# Patient Record
Sex: Female | Born: 1985 | Race: White | Hispanic: No | Marital: Married | Smoking: Never smoker
Health system: Southern US, Community
[De-identification: ages and names within clinical notes are randomized; demographics above are authoritative.]

## PROBLEM LIST (undated history)

## (undated) ENCOUNTER — Inpatient Hospital Stay (HOSPITAL_COMMUNITY): Payer: Self-pay

## (undated) DIAGNOSIS — L2989 Other pruritus: Secondary | ICD-10-CM

## (undated) DIAGNOSIS — Z6836 Body mass index (BMI) 36.0-36.9, adult: Secondary | ICD-10-CM

## (undated) DIAGNOSIS — E6609 Other obesity due to excess calories: Secondary | ICD-10-CM

## (undated) DIAGNOSIS — R233 Spontaneous ecchymoses: Secondary | ICD-10-CM

## (undated) DIAGNOSIS — G43011 Migraine without aura, intractable, with status migrainosus: Secondary | ICD-10-CM

## (undated) DIAGNOSIS — Z0000 Encounter for general adult medical examination without abnormal findings: Secondary | ICD-10-CM

## (undated) DIAGNOSIS — L298 Other pruritus: Secondary | ICD-10-CM

## (undated) DIAGNOSIS — D509 Iron deficiency anemia, unspecified: Secondary | ICD-10-CM

## (undated) DIAGNOSIS — J019 Acute sinusitis, unspecified: Secondary | ICD-10-CM

## (undated) DIAGNOSIS — E66812 Obesity, class 2: Secondary | ICD-10-CM

## (undated) DIAGNOSIS — B9689 Other specified bacterial agents as the cause of diseases classified elsewhere: Secondary | ICD-10-CM

## (undated) DIAGNOSIS — G43909 Migraine, unspecified, not intractable, without status migrainosus: Secondary | ICD-10-CM

## (undated) DIAGNOSIS — R112 Nausea with vomiting, unspecified: Secondary | ICD-10-CM

## (undated) DIAGNOSIS — K8020 Calculus of gallbladder without cholecystitis without obstruction: Secondary | ICD-10-CM

## (undated) DIAGNOSIS — Z9889 Other specified postprocedural states: Secondary | ICD-10-CM

## (undated) DIAGNOSIS — R030 Elevated blood-pressure reading, without diagnosis of hypertension: Secondary | ICD-10-CM

## (undated) DIAGNOSIS — K5790 Diverticulosis of intestine, part unspecified, without perforation or abscess without bleeding: Secondary | ICD-10-CM

## (undated) DIAGNOSIS — IMO0001 Reserved for inherently not codable concepts without codable children: Secondary | ICD-10-CM

## (undated) DIAGNOSIS — E669 Obesity, unspecified: Secondary | ICD-10-CM

## (undated) DIAGNOSIS — Z9289 Personal history of other medical treatment: Secondary | ICD-10-CM

## (undated) DIAGNOSIS — D649 Anemia, unspecified: Secondary | ICD-10-CM

## (undated) DIAGNOSIS — K589 Irritable bowel syndrome without diarrhea: Secondary | ICD-10-CM

## (undated) DIAGNOSIS — F419 Anxiety disorder, unspecified: Secondary | ICD-10-CM

## (undated) DIAGNOSIS — K625 Hemorrhage of anus and rectum: Secondary | ICD-10-CM

## (undated) DIAGNOSIS — E785 Hyperlipidemia, unspecified: Secondary | ICD-10-CM

## (undated) HISTORY — DX: Elevated blood-pressure reading, without diagnosis of hypertension: R03.0

## (undated) HISTORY — DX: Hyperlipidemia, unspecified: E78.5

## (undated) HISTORY — PX: DENTAL SURGERY: SHX609

## (undated) HISTORY — DX: Irritable bowel syndrome, unspecified: K58.9

## (undated) HISTORY — DX: Reserved for inherently not codable concepts without codable children: IMO0001

## (undated) HISTORY — DX: Hemorrhage of anus and rectum: K62.5

## (undated) HISTORY — PX: CYST EXCISION: SHX5701

## (undated) HISTORY — DX: Diverticulosis of intestine, part unspecified, without perforation or abscess without bleeding: K57.90

## (undated) HISTORY — DX: Nausea with vomiting, unspecified: R11.2

## (undated) HISTORY — DX: Other specified postprocedural states: Z98.890

## (undated) HISTORY — DX: Anemia, unspecified: D64.9

---

## 2011-11-23 ENCOUNTER — Ambulatory Visit (INDEPENDENT_AMBULATORY_CARE_PROVIDER_SITE_OTHER): Payer: 59

## 2011-11-23 ENCOUNTER — Ambulatory Visit (INDEPENDENT_AMBULATORY_CARE_PROVIDER_SITE_OTHER): Payer: Self-pay | Admitting: Sports Medicine

## 2011-11-23 ENCOUNTER — Encounter: Payer: Self-pay | Admitting: Sports Medicine

## 2011-11-23 VITALS — BP 111/71 | HR 73 | Wt 207.0 lb

## 2011-11-23 DIAGNOSIS — M5412 Radiculopathy, cervical region: Secondary | ICD-10-CM

## 2011-11-23 DIAGNOSIS — E785 Hyperlipidemia, unspecified: Secondary | ICD-10-CM

## 2011-11-23 DIAGNOSIS — M545 Low back pain, unspecified: Secondary | ICD-10-CM

## 2011-11-23 DIAGNOSIS — M542 Cervicalgia: Secondary | ICD-10-CM

## 2011-11-23 DIAGNOSIS — Z299 Encounter for prophylactic measures, unspecified: Secondary | ICD-10-CM | POA: Insufficient documentation

## 2011-11-23 MED ORDER — RED YEAST RICE 600 MG PO TABS: ORAL_TABLET | ORAL | Status: DC

## 2011-11-23 MED ORDER — PREDNISONE 50 MG PO TABS: ORAL_TABLET | ORAL | Status: DC

## 2011-11-23 MED ORDER — CYCLOBENZAPRINE HCL 10 MG PO TABS: ORAL_TABLET | ORAL | Status: DC

## 2011-11-23 MED ORDER — MELOXICAM 15 MG PO TABS: ORAL_TABLET | ORAL | Status: DC

## 2011-11-23 NOTE — Assessment & Plan Note (Signed)
She would like to try herbals, and oils first. I think this is appropriate. I will give her 6 weeks, and then we will recheck. She may also try  red rice yeast extract.

## 2011-11-23 NOTE — Progress Notes (Signed)
Subjective:     Patient ID: Michelle Moody, female   DOB: Mar 21, 1985, 26 y.o.   MRN: [id number]  HPI Patient is a 26 yo caucasian female with a PMH of HLD who is presenting today for establishment of care. Patient has not seen a regular PCP since moving from [place] 8 years prior but has received health care from her OBGYN.   Patient has two main complaints,  Neck pain radiating into right middle finger. Patient says the pain started in 2011 after adjusting to catch a baby child that she was carrying. Patient complains of pain with vertical movement of her head but not horizontal. Says the pain ranges from a 3-6/10 depending on the day and it only hurts around 1 day per week. The pain in the neck radiates into a numbness shooting down the dorsal aspect of her right arm ending in the middle finger.   The pain in her neck is associated with migraine headaches. She says the headaches often occur when her pain is present. She also has a longer history of migraine headaches that routinely occur 10 days before her menstrual cycle.   Lower Back Pain: Patient describes the pain as having started during childbirth 13 months prior. She believes the pain is from a bad epidural injection because it took around an hour to complete the injection. She describes the pain as "A dull ache on the center of my spine". This pain is not made worse by coughing, sneezing, walking or running. The says the pain is worst when she is laying down on her stomach or back. She now has to sleep on her side with a pillow between her legs.    Review of Systems  Constitutional: Negative.   HENT: Negative.   Eyes: Negative.   Respiratory: Negative.   Cardiovascular: Negative.   Gastrointestinal: Negative.   Genitourinary: Negative.   Musculoskeletal: Positive for back pain.  Skin: Negative.   Neurological: Negative.   Hematological: Negative.   Psychiatric/Behavioral: Negative.        Objective:   Physical Exam    Constitutional: She is oriented to person, place, and time. She appears well-developed and well-nourished.  HENT:  Head: Normocephalic.  Eyes: Pupils are equal, round, and reactive to light. Right eye exhibits no discharge. Left eye exhibits no discharge. No scleral icterus.  Neck: Normal range of motion. Neck supple. No JVD present. No tracheal deviation present. No thyromegaly present.  Cardiovascular: Normal rate, regular rhythm, normal heart sounds and intact distal pulses.   Pulmonary/Chest: Effort normal and breath sounds normal. No stridor. No respiratory distress. She has no wheezes. She has no rales. She exhibits no tenderness.  Abdominal: Soft.  Musculoskeletal: Normal range of motion. She exhibits tenderness. She exhibits no edema.       Cervical back: She exhibits tenderness.       Lumbar back: She exhibits tenderness.  Lymphadenopathy:    She has no cervical adenopathy.  Neurological: She is alert and oriented to person, place, and time. She has normal reflexes. No cranial nerve deficit. Coordination normal.  Skin: Skin is warm and dry.  Psychiatric: She has a normal mood and affect. Her behavior is normal. Judgment and thought content normal.       Assessment/Plan:

## 2011-11-23 NOTE — Assessment & Plan Note (Signed)
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 X-rays, prednisone, Flexeril, Mobic, formal physical therapy. I will see her back in 4 weeks.

## 2011-11-23 NOTE — Assessment & Plan Note (Signed)
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 Mobic, Flexeril as above. I would like her to work on this as well with formal physical therapy. X-rays of her lumbar spine.

## 2011-11-23 NOTE — Patient Instructions (Addendum)
Try red rice yeast extract, 3 times a day. Come back to see me in 4-6 weeks, we will recheck your cholesterol, and if no better should try a medical intervention.

## 2011-12-08 ENCOUNTER — Telehealth: Payer: Self-pay | Admitting: *Deleted

## 2011-12-08 ENCOUNTER — Other Ambulatory Visit: Payer: Self-pay | Admitting: Sports Medicine

## 2011-12-08 MED ORDER — ELETRIPTAN HYDROBROMIDE 20 MG PO TABS: ORAL_TABLET | ORAL | Status: DC

## 2011-12-08 NOTE — Telephone Encounter (Signed)
Pt.notified

## 2011-12-08 NOTE — Telephone Encounter (Signed)
Pt calls and states she has history of migraines that correlates with her cycle and has been on relpax in the past. Would like rx for this sent to her pharmacy if at all possible

## 2011-12-08 NOTE — Telephone Encounter (Signed)
Pt states she hasn't received a call back for her xr results. Please advise.

## 2011-12-08 NOTE — Telephone Encounter (Signed)
Yeah, I was going to just see her back in 4 weeks. There's nothing on the x-rays other than signs of muscle spasm.

## 2011-12-27 ENCOUNTER — Ambulatory Visit: Payer: Self-pay | Admitting: Sports Medicine

## 2012-02-23 ENCOUNTER — Emergency Department (HOSPITAL_BASED_OUTPATIENT_CLINIC_OR_DEPARTMENT_OTHER): Payer: 59

## 2012-02-23 ENCOUNTER — Encounter (HOSPITAL_BASED_OUTPATIENT_CLINIC_OR_DEPARTMENT_OTHER): Payer: Self-pay | Admitting: *Deleted

## 2012-02-23 ENCOUNTER — Emergency Department (HOSPITAL_BASED_OUTPATIENT_CLINIC_OR_DEPARTMENT_OTHER)
Admission: EM | Admit: 2012-02-23 | Discharge: 2012-02-23 | Disposition: A | Discharge status: Home / Self Care | Payer: 59 | Attending: Emergency Medicine | Admitting: Emergency Medicine

## 2012-02-23 DIAGNOSIS — E785 Hyperlipidemia, unspecified: Secondary | ICD-10-CM | POA: Insufficient documentation

## 2012-02-23 DIAGNOSIS — IMO0002 Reserved for concepts with insufficient information to code with codable children: Secondary | ICD-10-CM | POA: Insufficient documentation

## 2012-02-23 DIAGNOSIS — K529 Noninfective gastroenteritis and colitis, unspecified: Secondary | ICD-10-CM

## 2012-02-23 DIAGNOSIS — Z791 Long term (current) use of non-steroidal anti-inflammatories (NSAID): Secondary | ICD-10-CM | POA: Insufficient documentation

## 2012-02-23 DIAGNOSIS — K5289 Other specified noninfective gastroenteritis and colitis: Secondary | ICD-10-CM | POA: Insufficient documentation

## 2012-02-23 DIAGNOSIS — G43909 Migraine, unspecified, not intractable, without status migrainosus: Secondary | ICD-10-CM | POA: Insufficient documentation

## 2012-02-23 DIAGNOSIS — Z79899 Other long term (current) drug therapy: Secondary | ICD-10-CM | POA: Insufficient documentation

## 2012-02-23 DIAGNOSIS — R110 Nausea: Secondary | ICD-10-CM | POA: Insufficient documentation

## 2012-02-23 HISTORY — DX: Migraine, unspecified, not intractable, without status migrainosus: G43.909

## 2012-02-23 LAB — URINALYSIS, ROUTINE W REFLEX MICROSCOPIC
Bilirubin Urine: NEGATIVE
Hgb urine dipstick: NEGATIVE
Protein, ur: NEGATIVE mg/dL
Urobilinogen, UA: 1 mg/dL (ref 0.0–1.0)

## 2012-02-23 LAB — CBC WITH DIFFERENTIAL/PLATELET
Eosinophils Absolute: 0.1 10*3/uL (ref 0.0–0.7)
Eosinophils Relative: 1 % (ref 0–5)
Lymphs Abs: 0.9 10*3/uL (ref 0.7–4.0)
MCH: 26.7 pg (ref 26.0–34.0)
MCV: 79.3 fL (ref 78.0–100.0)
Platelets: 263 10*3/uL (ref 150–400)
RBC: 4.3 MIL/uL (ref 3.87–5.11)
RDW: 14.8 % (ref 11.5–15.5)

## 2012-02-23 LAB — COMPREHENSIVE METABOLIC PANEL
ALT: 68 U/L — ABNORMAL HIGH (ref 0–35)
Calcium: 9.1 mg/dL (ref 8.4–10.5)
Creatinine, Ser: 0.6 mg/dL (ref 0.50–1.10)
GFR calc Af Amer: >90 mL/min (ref >90)
Glucose, Bld: 124 mg/dL — ABNORMAL HIGH (ref 70–99)
Sodium: 139 mEq/L (ref 135–145)
Total Protein: 6.7 g/dL (ref 6.0–8.3)

## 2012-02-23 LAB — URINE MICROSCOPIC-ADD ON

## 2012-02-23 LAB — LIPASE, BLOOD: Lipase: 27 U/L (ref 11–59)

## 2012-02-23 MED ORDER — ONDANSETRON HCL 8 MG PO TABS: 8.0000 mg | ORAL_TABLET | ORAL | Status: DC | PRN

## 2012-02-23 MED ORDER — IOHEXOL 300 MG/ML  SOLN
100.0000 mL | Freq: Once | INTRAMUSCULAR | Status: AC | PRN
  Administered 2012-02-23: 100 mL via INTRAVENOUS

## 2012-02-23 MED ORDER — KETOROLAC TROMETHAMINE 30 MG/ML IJ SOLN
30.0000 mg | Freq: Once | INTRAMUSCULAR | Status: AC
  Administered 2012-02-23: 30 mg via INTRAVENOUS
  Filled 2012-02-23: qty 1

## 2012-02-23 MED ORDER — SODIUM CHLORIDE 0.9 % IV BOLUS (SEPSIS)
1000.0000 mL | Freq: Once | INTRAVENOUS | Status: AC
  Administered 2012-02-23: 1000 mL via INTRAVENOUS

## 2012-02-23 MED ORDER — IOHEXOL 300 MG/ML  SOLN
50.0000 mL | Freq: Once | INTRAMUSCULAR | Status: AC | PRN
  Administered 2012-02-23: 50 mL via ORAL

## 2012-02-23 MED ORDER — ONDANSETRON HCL 4 MG/2ML IJ SOLN
4.0000 mg | Freq: Once | INTRAMUSCULAR | Status: AC
  Administered 2012-02-23: 4 mg via INTRAVENOUS
  Filled 2012-02-23: qty 2

## 2012-02-23 NOTE — ED Notes (Signed)
Pt c/o sharp epigastric pain with nausea and diarrhea but no vomiting since 10:00pm.

## 2012-02-23 NOTE — ED Provider Notes (Signed)
History     CSN: [id number]  Arrival date & time 02/23/12  0239   First MD Initiated Contact with Patient 02/23/12 0254      Chief Complaint  Patient presents with  . Abdominal Pain    (Consider location/radiation/quality/duration/timing/severity/associated sxs/prior treatment) HPI Comments: Patient presents diarrhea off and on all week.  Started last night with severe pain in the epigastric region.  She has been nauseated but no vomiting.  Reports temp of 99 at home.  No urinary complaints.    Patient is a 27 y.o. female presenting with abdominal pain. The history is provided by the patient.  Abdominal Pain The primary symptoms of the illness include abdominal pain, nausea and diarrhea. The primary symptoms of the illness do not include fever. Episode onset: 10 PM. The onset of the illness was sudden. The problem has been rapidly worsening.  The patient states that she believes she is currently not pregnant. The patient has had a change in bowel habit.    Past Medical History  Diagnosis Date  . Hyperlipidemia   . Migraines     History reviewed. No pertinent past surgical history.  Family History  Problem Relation Age of Onset  . Cancer Father   . Hyperlipidemia Father   . Hypertension Father   . Cancer Maternal Grandmother   . Alcohol abuse Maternal Grandfather   . Stroke Paternal Grandmother   . Heart disease Paternal Grandfather   . Hyperlipidemia Paternal Grandfather   . Hypertension Paternal Grandfather     History  Substance Use Topics  . Smoking status: Never Smoker   . Smokeless tobacco: Not on file  . Alcohol Use: No    OB History    Grav Para Term Preterm Abortions TAB SAB Ect Mult Living                  Review of Systems  Constitutional: Negative for fever.  Gastrointestinal: Positive for nausea, abdominal pain and diarrhea.  All other systems reviewed and are negative.    Allergies  Clindamycin/lincomycin  Home Medications   Current  Outpatient Rx  Name  Route  Sig  Dispense  Refill  . CYCLOBENZAPRINE HCL 10 MG PO TABS      One half tab PO qHS, then increase gradually to one tab TID.   30 tablet   0   . ELETRIPTAN HYDROBROMIDE 20 MG PO TABS      One tablet by mouth at onset of headache. May repeat in 2 hours if headache persists or recurs. may repeat in 2 hours if necessary   10 tablet   3   . MELOXICAM 15 MG PO TABS      One tab PO qAM with breakfast for 2 weeks, then daily prn pain.   30 tablet   3   . PREDNISONE 50 MG PO TABS      One tab PO daily for 5 days.   5 tablet   0   . RED YEAST RICE 600 MG PO TABS      One tab by mouth 3 times a day.           BP 119/61  Pulse 98  Temp 98.9 F (37.2 C) (Oral)  Resp 20  Ht 5\' 4"  (1.626 m)  Wt 203 lb (92.08 kg)  BMI 34.84 kg/m2  SpO2 100%  LMP 02/01/2012  Physical Exam  Nursing note and vitals reviewed. Constitutional: She is oriented to person, place, and time. She appears well-developed  and well-nourished. No distress.  HENT:  Head: Normocephalic and atraumatic.  Neck: Normal range of motion. Neck supple.  Cardiovascular: Normal rate and regular rhythm.  Exam reveals no gallop and no friction rub.   No murmur heard. Pulmonary/Chest: Effort normal and breath sounds normal. No respiratory distress. She has no wheezes.  Abdominal: Soft. Bowel sounds are normal. She exhibits no distension. There is no rebound and no guarding.       There is ttp in the epigastric region with no rebound or guarding.  Bowel sounds are present.    Musculoskeletal: Normal range of motion.  Neurological: She is alert and oriented to person, place, and time.  Skin: Skin is warm and dry. She is not diaphoretic.    ED Course  Procedures (including critical care time)   Labs Reviewed  PREGNANCY, URINE  URINALYSIS, ROUTINE W REFLEX MICROSCOPIC   No results found.   No diagnosis found.    MDM  The patient presents with nausea and diarrhea that sounds viral  in nature.  She reports significant epigastric discomfort and tells me she has a history of having had gallstones in the past.  She has a mild elevation of the ast and alt of which I am uncertain the significance.  For this reason, a ct was obtained that showed cholelithiasis but did not show any cholecystitis or ductal dilatation.  She will be discharged to home with zofran, to return prn.            Geoffery Lyons, MD 02/23/12 [phone]

## 2012-02-23 NOTE — ED Notes (Signed)
MD at bedside. 

## 2012-02-23 NOTE — ED Notes (Signed)
Patient transported to CT 

## 2012-02-23 NOTE — ED Notes (Signed)
Pt returned from CT °

## 2012-02-24 LAB — URINE CULTURE: Colony Count: 25000

## 2012-06-08 ENCOUNTER — Other Ambulatory Visit: Payer: Self-pay | Admitting: *Deleted

## 2012-06-08 ENCOUNTER — Encounter: Payer: Self-pay | Admitting: Sports Medicine

## 2012-06-08 ENCOUNTER — Ambulatory Visit (INDEPENDENT_AMBULATORY_CARE_PROVIDER_SITE_OTHER): Payer: 59 | Admitting: Sports Medicine

## 2012-06-08 VITALS — BP 128/75 | HR 83 | Wt 211.0 lb

## 2012-06-08 DIAGNOSIS — L723 Sebaceous cyst: Secondary | ICD-10-CM

## 2012-06-08 MED ORDER — CEPHALEXIN 500 MG PO CAPS: 500.0000 mg | ORAL_CAPSULE | Freq: Three times a day (TID) | ORAL | Status: DC

## 2012-06-08 NOTE — Progress Notes (Signed)
  Subjective:    CC: Cyst  HPI: Shyia has had a lump on the right side of her head for approximately 3 years. It has been slowly growing. She does have a history of multiple sebaceous cysts, and has had several removed already. Unfortunately, she is unable to have this done in the operating room, and requests I remove it today. It is minimally painful, pain is localized, doesn't radiate, mild.  Past medical history, Surgical history, Family history not pertinant except as noted below, Social history, Allergies, and medications have been entered into the medical record, reviewed, and no changes needed.   Review of Systems: No fevers, chills, night sweats, weight loss, chest pain, or shortness of breath.   Objective:    General: Well Developed, well nourished, and in no acute distress.  Neuro: Alert and oriented x3, extra-ocular muscles intact, sensation grossly intact.  HEENT: Normocephalic, atraumatic, pupils equal round reactive to light, neck supple, no masses, no lymphadenopathy, thyroid nonpalpable.  Skin: Warm and dry, no rashes.  There is a 3 cm sebaceous cyst of the right side of the head. Cardiac: Regular rate and rhythm, no murmurs rubs or gallops, no lower extremity edema.  Respiratory: Clear to auscultation bilaterally. Not using accessory muscles, speaking in full sentences.  Procedure:  Excision of  right-sided head sebaceous cyst. Risks, benefits, and alternatives explained and consent obtained. Time out conducted. Surface prepped with alcohol and Betadine. 5cc lidocaine with epinephine infiltrated in a field block. Adequate anesthesia ensured. Area prepped and draped in a sterile fashion. Incision made with 18-gauge needle. Using sharp and blunt dissection, the sebaceous cyst was empty, the cyst capsule was then removed en bloc. A single 3-0 Ethilon vertical mattress suture was placed. Hemostasis achieved. Pt stable.  Impression and Recommendations:

## 2012-06-08 NOTE — Assessment & Plan Note (Addendum)
Excision of right-sided scalp sebaceous cyst. Keflex for 7 days given for infection prophylaxis. A single vertical mattress suture was placed to close off the potential space. She'll return in one week for suture removal and consideration of excision of the next cyst. I have asked her to shave a small part of her head to assist me in maintaining a clear operating field.

## 2012-06-14 ENCOUNTER — Ambulatory Visit: Payer: 59 | Admitting: Sports Medicine

## 2012-06-15 ENCOUNTER — Ambulatory Visit: Payer: 59 | Admitting: Sports Medicine

## 2012-06-18 ENCOUNTER — Encounter: Payer: Self-pay | Admitting: Sports Medicine

## 2012-06-18 ENCOUNTER — Ambulatory Visit (INDEPENDENT_AMBULATORY_CARE_PROVIDER_SITE_OTHER): Payer: 59 | Admitting: Sports Medicine

## 2012-06-18 VITALS — BP 112/72 | HR 74

## 2012-06-18 DIAGNOSIS — L723 Sebaceous cyst: Secondary | ICD-10-CM

## 2012-06-18 NOTE — Progress Notes (Addendum)
   Procedure:  Excision of  left-sided head approximately 2.5cm sebaceous cyst. Risks, benefits, and alternatives explained and consent obtained. Time out conducted. Surface prepped with alcohol and Betadine. 5cc lidocaine with epinephine infiltrated in a field block. Adequate anesthesia ensured. Area prepped and draped in a sterile fashion. Incision made with 18-gauge needle. Using sharp and blunt dissection, the 2.5 cm sebaceous cyst was emptied, the cyst capsule was then removed en bloc. A single 4-0 Ethilon horizontal mattress suture was placed. Hemostasis achieved. Pt stable.

## 2012-06-18 NOTE — Assessment & Plan Note (Signed)
Removed cyst as above. She will shave hair around the next cyst, and come back for removal. Remove sutures in 5-7 days.

## 2012-06-25 ENCOUNTER — Encounter: Payer: Self-pay | Admitting: Sports Medicine

## 2012-06-25 ENCOUNTER — Ambulatory Visit (INDEPENDENT_AMBULATORY_CARE_PROVIDER_SITE_OTHER): Payer: 59 | Admitting: Sports Medicine

## 2012-06-25 VITALS — BP 106/70 | HR 80

## 2012-06-25 DIAGNOSIS — L723 Sebaceous cyst: Secondary | ICD-10-CM

## 2012-06-25 NOTE — Assessment & Plan Note (Signed)
Excision of sebaceous cyst as above. This makes 3 down, 10 to go. Return in 5-7 days for suture removal.

## 2012-06-25 NOTE — Progress Notes (Addendum)
   Procedure:  Excision of occipital head approximately 2.5cm sebaceous cyst. Risks, benefits, and alternatives explained and consent obtained. Time out conducted. Surface prepped with alcohol and Betadine. 5cc lidocaine with epinephine infiltrated in a field block. Adequate anesthesia ensured. Area prepped and draped in a sterile fashion. Incision made with #11 blade. Using blunt dissection, the 2.5 cm sebaceous cyst removed en bloc. A single 4-0 Ethilon horizontal mattress suture was placed. Hemostasis achieved. Pt stable.

## 2012-07-02 ENCOUNTER — Ambulatory Visit (INDEPENDENT_AMBULATORY_CARE_PROVIDER_SITE_OTHER): Payer: 59 | Admitting: Sports Medicine

## 2012-07-02 ENCOUNTER — Ambulatory Visit: Payer: 59 | Admitting: Sports Medicine

## 2012-07-02 DIAGNOSIS — Z4802 Encounter for removal of sutures: Secondary | ICD-10-CM

## 2012-07-02 DIAGNOSIS — L723 Sebaceous cyst: Secondary | ICD-10-CM

## 2012-07-02 NOTE — Assessment & Plan Note (Signed)
Mattress suture removed as above. Return as needed.

## 2012-07-02 NOTE — Progress Notes (Signed)
  Subjective:    Patient ID: Michelle Moody, female    DOB: September 11, 1985, 27 y.o.   MRN: [id number] Suture removal on RT side of head. One suture removed. No s/s of infection. HPI    Review of Systems     Objective:   Physical Exam        Assessment & Plan:   I was present for all essential parts of this visit and procedure. Ihor Austin. Benjamin Stain, M.D.

## 2012-07-03 ENCOUNTER — Encounter: Payer: Self-pay | Admitting: Family Medicine

## 2012-07-03 ENCOUNTER — Telehealth: Payer: Self-pay | Admitting: *Deleted

## 2012-07-03 ENCOUNTER — Ambulatory Visit (INDEPENDENT_AMBULATORY_CARE_PROVIDER_SITE_OTHER): Payer: 59 | Admitting: Family Medicine

## 2012-07-03 VITALS — BP 118/76 | HR 77 | Wt 210.0 lb

## 2012-07-03 DIAGNOSIS — G43909 Migraine, unspecified, not intractable, without status migrainosus: Secondary | ICD-10-CM

## 2012-07-03 DIAGNOSIS — R110 Nausea: Secondary | ICD-10-CM

## 2012-07-03 MED ORDER — ONDANSETRON HCL 8 MG PO TABS: 8.0000 mg | ORAL_TABLET | ORAL | Status: DC | PRN

## 2012-07-03 MED ORDER — KETOROLAC TROMETHAMINE 60 MG/2ML IM SOLN
60.0000 mg | Freq: Once | INTRAMUSCULAR | Status: AC
  Administered 2012-07-03: 60 mg via INTRAMUSCULAR

## 2012-07-03 MED ORDER — METHYLPREDNISOLONE SODIUM SUCC 125 MG IJ SOLR
125.0000 mg | Freq: Once | INTRAMUSCULAR | Status: AC
  Administered 2012-07-03: 125 mg via INTRAMUSCULAR

## 2012-07-03 MED ORDER — ONDANSETRON 8 MG PO TBDP
8.0000 mg | ORAL_TABLET | Freq: Once | ORAL | Status: AC
  Administered 2012-07-03: 8 mg via ORAL

## 2012-07-03 NOTE — Progress Notes (Signed)
CC: Michelle Moody is a 27 y.o. female is here for Migraine   Subjective: HPI:  Patient complains of headache moderate to severe in severity. Came on gradually yesterday around 10 AM. Described as a pulsatile right-sided headache from the back of her head just behind the right eye.. she endorses photophobia and phonophobia does make headache worse. Symptoms are improved in dark room quiet environments. She has tried Excedrin, Relpax, and caffeine without much improvement of symptoms. She reports nausea has accompanied headache since onset. Has improved with Zofran since presenting here She reports character and severity are similar to prior migraines. She denies worst headache of life, positional component of headache, vision loss, dizziness, confusion, motor or sensory disturbances. She denies recent tick bite, fevers, chills, rashes. She reports these typically come on before. And she has had some menstrual irregularity over the past month, is sexually active unprotected.   Review Of Systems Outlined In HPI  Past Medical History  Diagnosis Date  . Hyperlipidemia   . Migraines      Family History  Problem Relation Age of Onset  . Cancer Father   . Hyperlipidemia Father   . Hypertension Father   . Cancer Maternal Grandmother   . Alcohol abuse Maternal Grandfather   . Stroke Paternal Grandmother   . Heart disease Paternal Grandfather   . Hyperlipidemia Paternal Grandfather   . Hypertension Paternal Grandfather      History  Substance Use Topics  . Smoking status: Never Smoker   . Smokeless tobacco: Not on file  . Alcohol Use: No     Objective: Filed Vitals:   07/03/12 0904  BP: 118/76  Pulse: 77    General: Alert and Oriented, No Acute Distress HEENT: Pupils equal, round, reactive to light. Conjunctivae clear.  External ears unremarkable, canals clear with intact TMs with appropriate landmarks.  Middle ear appears open without effusion. Pink inferior turbinates.  Moist  mucous membranes, pharynx without inflammation nor lesions.  Neck supple without palpable lymphadenopathy nor abnormal masses. Scalp without abnormalities, all surgical sites clean dry intact well healing Lungs: Clear comfortable work of breathing Cardiac: Regular rate and rhythm.  Neuro: CN II-XII grossly intact, full strength/rom of all four extremities, C5/L4/S1 DTRs 2/4 bilaterally, gait normal, rapid alternating movements normal, heel-shin test normal,  Extremities: No peripheral edema.  Strong peripheral pulses.  Mental Status: No depression, anxiety, nor agitation. Skin: Warm and dry.  Assessment & Plan: Michelle Moody was seen today for migraine.  Diagnoses and associated orders for this visit:  Nausea alone - ondansetron (ZOFRAN-ODT) disintegrating tablet 8 mg; Take 1 tablet (8 mg total) by mouth once. - Discontinue: ondansetron (ZOFRAN) 8 MG tablet; Take 1 tablet (8 mg total) by mouth every 4 (four) hours as needed for nausea. - POCT urine pregnancy - ondansetron (ZOFRAN) 8 MG tablet; Take 1 tablet (8 mg total) by mouth every 4 (four) hours as needed for nausea.  Migraine - Discontinue: ondansetron (ZOFRAN) 8 MG tablet; Take 1 tablet (8 mg total) by mouth every 4 (four) hours as needed for nausea. - ondansetron (ZOFRAN) 8 MG tablet; Take 1 tablet (8 mg total) by mouth every 4 (four) hours as needed for nausea. - ketorolac (TORADOL) injection 60 mg; Inject 2 mLs (60 mg total) into the muscle once. - methylPREDNISolone sodium succinate (SOLU-MEDROL) 125 mg/2 mL injection 125 mg; Inject 2 mLs (125 mg total) into the muscle once.    Migraine: Acute exacerbation, treating with Solu-Medrol, Toradol, Zofran for nausea. Encouraged her to take  the afternoon off and rest, stay well hydrated, urine pregnancy test negative.  Return if symptoms worsen or fail to improve.

## 2012-07-03 NOTE — Telephone Encounter (Addendum)
Pt called and states the migraine has not gone away and its starting to get worse again. The shots given in the office did help some but pt wants to know if there is anything else she can do. Please advise Per Dr. Ivan Anchors advised pt that she should start getting some relief from the steroid shot by this evening. Pt voiced understading

## 2012-07-04 ENCOUNTER — Encounter: Payer: Self-pay | Admitting: Family Medicine

## 2012-07-04 ENCOUNTER — Ambulatory Visit (INDEPENDENT_AMBULATORY_CARE_PROVIDER_SITE_OTHER): Payer: 59 | Admitting: Family Medicine

## 2012-07-04 VITALS — BP 108/66 | HR 90 | Wt 214.0 lb

## 2012-07-04 DIAGNOSIS — G43909 Migraine, unspecified, not intractable, without status migrainosus: Secondary | ICD-10-CM

## 2012-07-04 MED ORDER — METHYLPREDNISOLONE ACETATE 80 MG/ML IJ SUSP
80.0000 mg | Freq: Once | INTRAMUSCULAR | Status: AC
  Administered 2012-07-04: 80 mg via INTRAMUSCULAR

## 2012-07-04 MED ORDER — KETOROLAC TROMETHAMINE 60 MG/2ML IM SOLN
60.0000 mg | Freq: Once | INTRAMUSCULAR | Status: AC
  Administered 2012-07-04: 60 mg via INTRAMUSCULAR

## 2012-07-04 NOTE — Progress Notes (Signed)
CC: Michelle Moody is a 27 y.o. female is here for Migraine   Subjective: HPI:  Patient returns with continued headache. Described as pounding moderate severity discomfort on the right side of her head it is nonradiating. She reports intensity is much less than that yesterday. Symptoms were significantly improved with Toradol injection and Solu-Medrol however her symptoms gradually returned overnight. Nausea is  controlled with Zofran. She continues to have mild photophobia and phonophobia which makes headache worse. Since yesterday she reports no new motor or sensory disturbances, denies vision loss, concentration difficulty, coordination difficulty, nor dizziness. Overnight symptoms slightly improved with Excedrin. She denies worst headache of life, positional component to headache, nor awakening because of headache. Denies fevers, chills, rashes, recent tick bite   Review Of Systems Outlined In HPI  Past Medical History  Diagnosis Date  . Hyperlipidemia   . Migraines      Family History  Problem Relation Age of Onset  . Cancer Father   . Hyperlipidemia Father   . Hypertension Father   . Cancer Maternal Grandmother   . Alcohol abuse Maternal Grandfather   . Stroke Paternal Grandmother   . Heart disease Paternal Grandfather   . Hyperlipidemia Paternal Grandfather   . Hypertension Paternal Grandfather      History  Substance Use Topics  . Smoking status: Never Smoker   . Smokeless tobacco: Not on file  . Alcohol Use: No     Objective: Filed Vitals:   07/04/12 1455  BP: 108/66  Pulse: 90    General: Alert and Oriented, No Acute Distress HEENT: Pupils equal, round, reactive to light. Conjunctivae clear.  External ears unremarkable, canals clear with intact TMs with appropriate landmarks.  Middle ear appears open without effusion. Pink inferior turbinates.  Moist mucous membranes, pharynx without inflammation nor lesions.  Neck supple without palpable lymphadenopathy nor  abnormal masses. Lungs: Clear to auscultation bilaterally, no wheezing/ronchi/rales.  Comfortable work of breathing. Good air movement. Cardiac: Regular rate and rhythm. Normal S1/S2.  No murmurs, rubs, nor gallops.   Neuro: CN II-XII grossly intact, full strength/rom of all four extremities, C5/L4/S1 DTRs 2/4 bilaterally, gait normal, rapid alternating movements normal, heel-shin test normal, Rhomberg normal. Extremities: No peripheral edema.  Strong peripheral pulses.  Mental Status: No depression, anxiety, nor agitation. Skin: Warm and dry.  Assessment & Plan: Michelle Moody was seen today for migraine.  Diagnoses and associated orders for this visit:  Migraine, unspecified, without mention of intractable migraine without mention of status migrainosus - methylPREDNISolone acetate (DEPO-MEDROL) injection 80 mg; Inject 1 mL (80 mg total) into the muscle once. - ketorolac (TORADOL) injection 60 mg; Inject 2 mLs (60 mg total) into the muscle once.    Exam reassuring suspect this is lingering migraine not completely terminated from yesterday's regimen, repeat Toradol injection, provided with Depo-Medrol instead of Solu-Medrol. Continue Zofran as needed. May take acetaminophen or Excedrin if symptoms return later tonight.Signs and symptoms requring emergent/urgent reevaluation were discussed with the patient.  Return if symptoms worsen or fail to improve.

## 2012-07-05 ENCOUNTER — Ambulatory Visit: Payer: 59 | Admitting: Sports Medicine

## 2012-08-20 ENCOUNTER — Other Ambulatory Visit: Payer: Self-pay | Admitting: Sports Medicine

## 2012-09-12 ENCOUNTER — Telehealth: Payer: Self-pay | Admitting: *Deleted

## 2012-09-12 NOTE — Telephone Encounter (Signed)
error 

## 2012-09-19 ENCOUNTER — Other Ambulatory Visit (HOSPITAL_COMMUNITY)
Admission: RE | Admit: 2012-09-19 | Discharge: 2012-09-19 | Disposition: A | Discharge status: Home / Self Care | Payer: 59 | Source: Ambulatory Visit | Attending: Sports Medicine | Admitting: Sports Medicine

## 2012-09-19 ENCOUNTER — Ambulatory Visit (INDEPENDENT_AMBULATORY_CARE_PROVIDER_SITE_OTHER): Payer: 59 | Admitting: Physician Assistant

## 2012-09-19 ENCOUNTER — Encounter: Payer: Self-pay | Admitting: Physician Assistant

## 2012-09-19 VITALS — BP 116/75 | HR 62 | Wt 198.0 lb

## 2012-09-19 DIAGNOSIS — Z0000 Encounter for general adult medical examination without abnormal findings: Secondary | ICD-10-CM

## 2012-09-19 DIAGNOSIS — Z01419 Encounter for gynecological examination (general) (routine) without abnormal findings: Secondary | ICD-10-CM | POA: Insufficient documentation

## 2012-09-19 DIAGNOSIS — Z13220 Encounter for screening for lipoid disorders: Secondary | ICD-10-CM

## 2012-09-19 DIAGNOSIS — N926 Irregular menstruation, unspecified: Secondary | ICD-10-CM

## 2012-09-19 DIAGNOSIS — Z23 Encounter for immunization: Secondary | ICD-10-CM

## 2012-09-19 DIAGNOSIS — Z131 Encounter for screening for diabetes mellitus: Secondary | ICD-10-CM

## 2012-09-19 DIAGNOSIS — T148XXA Other injury of unspecified body region, initial encounter: Secondary | ICD-10-CM

## 2012-09-19 DIAGNOSIS — N920 Excessive and frequent menstruation with regular cycle: Secondary | ICD-10-CM

## 2012-09-19 LAB — COMPLETE METABOLIC PANEL WITH GFR
BUN: 11 mg/dL (ref 6–23)
CO2: 21 mEq/L (ref 19–32)
Calcium: 9.2 mg/dL (ref 8.4–10.5)
Creat: 0.67 mg/dL (ref 0.50–1.10)
GFR, Est African American: >89 mL/min
GFR, Est Non African American: >89 mL/min
Glucose, Bld: 83 mg/dL (ref 70–99)
Total Bilirubin: 0.3 mg/dL (ref 0.3–1.2)

## 2012-09-19 LAB — CBC
Hemoglobin: 13.1 g/dL (ref 12.0–15.0)
Platelets: 314 10*3/uL (ref 150–400)
RBC: 4.94 MIL/uL (ref 3.87–5.11)
WBC: 8.3 10*3/uL (ref 4.0–10.5)

## 2012-09-19 LAB — LIPID PANEL
Cholesterol: 166 mg/dL (ref 0–200)
HDL: 43 mg/dL (ref >39)
LDL Cholesterol: 106 mg/dL — ABNORMAL HIGH (ref 0–99)
Triglycerides: 83 mg/dL (ref <150)

## 2012-09-19 LAB — POCT URINE PREGNANCY: Preg Test, Ur: NEGATIVE

## 2012-09-19 LAB — VITAMIN B12: Vitamin B-12: 701 pg/mL (ref 211–911)

## 2012-09-19 NOTE — Patient Instructions (Signed)

## 2012-09-20 LAB — VITAMIN D 25 HYDROXY (VIT D DEFICIENCY, FRACTURES): Vit D, 25-Hydroxy: 37 ng/mL (ref 30–89)

## 2012-09-21 NOTE — Progress Notes (Signed)
Subjective:     Michelle Moody is a 27 y.o. female and is here for a comprehensive physical exam. The patient reports problems - Patient does seem to be bruising easier than in the past. She does continue to lose weight. She has lost 16-17 pounds in the last 2 months. She has almost completely a laminated sleep, increased proteins and decrease carbs. She has a lot of energy and feels good about herself. She is exercising at least once or twice a week but now she does need to step this up.  Patient and husband are considering starting to try for another child. They are not preventing pregnancy but they are not actively trying. She did have a normal. The last month that was light and more mucousy. She would like to confirm she is not pregnant today.  History   Social History  . Marital Status: Married    Spouse Name: N/A    Number of Children: N/A  . Years of Education: N/A   Occupational History  . Not on file.   Social History Main Topics  . Smoking status: Never Smoker   . Smokeless tobacco: Not on file  . Alcohol Use: No  . Drug Use: No  . Sexual Activity: Yes    Birth Control/ Protection: Condom   Other Topics Concern  . Not on file   Social History Narrative  . No narrative on file   Health Maintenance  Topic Date Due  . Influenza Vaccine  08/17/2012  . Pap Smear  09/20/2015  . Tetanus/tdap  09/20/2022    The following portions of the patient's history were reviewed and updated as appropriate: allergies, current medications, past family history, past medical history, past social history, past surgical history and problem list.  Review of Systems A comprehensive review of systems was negative.   Objective:    BP 116/75  Pulse 62  Wt 198 lb (89.812 kg)  BMI 33.97 kg/m2  LMP 08/31/2012 General appearance: alert, cooperative and appears stated age Head: Normocephalic, without obvious abnormality, atraumatic Eyes: conjunctivae/corneas clear. PERRL, EOM's intact.  Fundi benign. Ears: normal TM's and external ear canals both ears Nose: Nares normal. Septum midline. Mucosa normal. No drainage or sinus tenderness. Throat: lips, mucosa, and tongue normal; teeth and gums normal Neck: no adenopathy, no carotid bruit, no JVD, supple, symmetrical, trachea midline and thyroid not enlarged, symmetric, no tenderness/mass/nodules Back: symmetric, no curvature. ROM normal. No CVA tenderness. Lungs: clear to auscultation bilaterally Breasts: normal appearance, no masses or tenderness Heart: regular rate and rhythm, S1, S2 normal, no murmur, click, rub or gallop Abdomen: soft, non-tender; bowel sounds normal; no masses,  no organomegaly Pelvic: cervix normal in appearance, external genitalia normal, no adnexal masses or tenderness, no cervical motion tenderness, uterus normal size, shape, and consistency and vagina normal without discharge Extremities: extremities normal, atraumatic, no cyanosis or edema Pulses: 2+ and symmetric Skin: Skin color, texture, turgor normal. No rashes or lesions or no excessive bruising noted on skin today. Lymph nodes: Cervical, supraclavicular, and axillary nodes normal. Neurologic: Grossly normal    Assessment:    Healthy female exam.      Plan:     CPE-Pap smear done today and will call with results. Fasting labs were ordered as well as ferritin, vitamin D, and B12. We'll look for any metabolic causes of bruising. Reassured patient that I do not see anything that was worrisome on exam today. Encouraged patient to continue on weight loss journey with balanced diet and  regular exercise. Urged patient to incorporate calcium and vitamin D and to daily diet.  Abnormal menstrual cycle- UPT negative. Continue to monitor. With increase in exercise and diet be trained in regularity trocar.  Tdap given today without complications. Patient declined flu shot today. See After Visit Summary for Counseling Recommendations

## 2012-10-23 ENCOUNTER — Telehealth: Payer: Self-pay

## 2012-10-23 NOTE — Telephone Encounter (Signed)
Relpax is pregnancy category C. which means that animal studies have shown some adverse effects, there are no human studies. Ideally she would stop this, and use medications such as Tylenol, or narcotics to stop her migraines.

## 2012-10-23 NOTE — Telephone Encounter (Signed)
Patient called stated that she is taking Welpax for migraines she is pregnant now and she wants to know what it is that she can take for the headaches and the dosage. Please advise patient stated that she wants to keeps her pregnancy confidential until she is at least 12 weeks. Rhonda Cunningham,CMA

## 2012-10-24 NOTE — Telephone Encounter (Signed)
Spoke to patient and advised her that tylenol would be better for her migraines. Rhonda Cunningham,CMA

## 2012-11-08 LAB — OB RESULTS CONSOLE VARICELLA ZOSTER ANTIBODY, IGG: Varicella: IMMUNE

## 2012-11-08 LAB — OB RESULTS CONSOLE HEPATITIS B SURFACE ANTIGEN: Hepatitis B Surface Ag: NEGATIVE

## 2012-11-08 LAB — OB RESULTS CONSOLE ANTIBODY SCREEN: ANTIBODY SCREEN: NEGATIVE

## 2012-11-08 LAB — OB RESULTS CONSOLE ABO/RH: RH Type: POSITIVE

## 2012-11-08 LAB — OB RESULTS CONSOLE GC/CHLAMYDIA
CHLAMYDIA, DNA PROBE: NEGATIVE
GC PROBE AMP, GENITAL: NEGATIVE

## 2012-11-08 LAB — OB RESULTS CONSOLE RUBELLA ANTIBODY, IGM: Rubella: IMMUNE

## 2012-11-08 LAB — OB RESULTS CONSOLE HIV ANTIBODY (ROUTINE TESTING): HIV: NONREACTIVE

## 2012-11-08 LAB — OB RESULTS CONSOLE RPR: RPR: NONREACTIVE

## 2013-01-06 ENCOUNTER — Encounter (HOSPITAL_COMMUNITY): Payer: Self-pay

## 2013-01-06 ENCOUNTER — Inpatient Hospital Stay (HOSPITAL_COMMUNITY): Payer: 59

## 2013-01-06 ENCOUNTER — Inpatient Hospital Stay (HOSPITAL_COMMUNITY)
Admission: AD | Admit: 2013-01-06 | Discharge: 2013-01-06 | Disposition: A | Discharge status: Home / Self Care | Payer: 59 | Source: Ambulatory Visit | Attending: Obstetrics and Gynecology | Admitting: Obstetrics and Gynecology

## 2013-01-06 DIAGNOSIS — R109 Unspecified abdominal pain: Secondary | ICD-10-CM | POA: Insufficient documentation

## 2013-01-06 DIAGNOSIS — O209 Hemorrhage in early pregnancy, unspecified: Secondary | ICD-10-CM | POA: Insufficient documentation

## 2013-01-06 LAB — URINALYSIS, ROUTINE W REFLEX MICROSCOPIC
Bilirubin Urine: NEGATIVE
Ketones, ur: NEGATIVE mg/dL
Leukocytes, UA: NEGATIVE
Nitrite: NEGATIVE
Protein, ur: NEGATIVE mg/dL

## 2013-01-06 NOTE — Progress Notes (Signed)
Notified of pts in MAU. States she wants MAU providers to see pts and report back.

## 2013-01-06 NOTE — MAU Note (Signed)
[facility] called about Michelle Moody status with pushing pt. States she is still pushing and will be down when she can.

## 2013-01-06 NOTE — MAU Provider Note (Signed)
History     CSN: [id number]  Arrival date and time: 01/06/13 1541   None     Chief Complaint  Patient presents with  . Vaginal Bleeding  . Abdominal Cramping   HPI  Ms. Michelle Moody is a 27 y.o. female G2P1001 at [redacted]w[redacted]d who presents with abdominal cramping that started this morning. She became nervous when she started having brown discharge that turned into pink tingle discharge. She feels throughout the day that the cramping has gotten worse. Denies intercourse recently.    OB History   Grav Para Term Preterm Abortions TAB SAB Ect Mult Living   2 1 1       1       Past Medical History  Diagnosis Date  . Hyperlipidemia   . Migraines     History reviewed. No pertinent past surgical history.  Family History  Problem Relation Age of Onset  . Cancer Father   . Hyperlipidemia Father   . Hypertension Father   . Cancer Maternal Grandmother   . Alcohol abuse Maternal Grandfather   . Stroke Paternal Grandmother   . Heart disease Paternal Grandfather   . Hyperlipidemia Paternal Grandfather   . Hypertension Paternal Grandfather     History  Substance Use Topics  . Smoking status: Never Smoker   . Smokeless tobacco: Not on file  . Alcohol Use: No    Allergies:  Allergies  Allergen Reactions  . Clindamycin/Lincomycin Nausea And Vomiting  . Latex Rash    Prescriptions prior to admission  Medication Sig Dispense Refill  . metoCLOPramide (REGLAN) 10 MG tablet Take 10 mg by mouth as needed for nausea (headache).      . Prenatal Vit-Fe Fumarate-FA (PRENATAL MULTIVITAMIN) TABS tablet Take 1 tablet by mouth daily at 12 noon.      . ranitidine (ZANTAC) 150 MG tablet Take 150 mg by mouth 2 (two) times daily.       Results for orders placed during the hospital encounter of 01/06/13 (from the past 24 hour(s))  URINALYSIS, ROUTINE W REFLEX MICROSCOPIC     Status: Abnormal   Collection Time    01/06/13  3:55 PM      Result Value Range   Color, Urine YELLOW  YELLOW   APPearance CLEAR  CLEAR   Specific Gravity, Urine <1.005 (*) 1.005 - 1.030   pH 6.0  5.0 - 8.0   Glucose, UA NEGATIVE  NEGATIVE mg/dL   Hgb urine dipstick NEGATIVE  NEGATIVE   Bilirubin Urine NEGATIVE  NEGATIVE   Ketones, ur NEGATIVE  NEGATIVE mg/dL   Protein, ur NEGATIVE  NEGATIVE mg/dL   Urobilinogen, UA 0.2  0.0 - 1.0 mg/dL   Nitrite NEGATIVE  NEGATIVE   Leukocytes, UA NEGATIVE  NEGATIVE    Review of Systems  Constitutional: Negative for fever and chills.  Gastrointestinal: Positive for abdominal pain. Negative for nausea, vomiting, diarrhea and constipation.  Genitourinary: Negative for dysuria, urgency, frequency and hematuria.       + vaginal discharge; pink/brown discharge.  No vaginal bleeding. No dysuria.   Musculoskeletal: Negative for back pain.   Physical Exam   Blood pressure 135/75, pulse 107, temperature 98.3 F (36.8 C), temperature source Oral, resp. rate 18, last menstrual period 09/27/2012.  Physical Exam  Constitutional: She is oriented to person, place, and time. She appears well-developed and well-nourished. No distress.  HENT:  Head: Normocephalic.  Eyes: Pupils are equal, round, and reactive to light.  Neck: Neck supple.  Respiratory: Effort normal.  GI:  Soft. She exhibits no distension. There is tenderness. There is no rebound and no guarding.  + suprapubic tenderness   Genitourinary:  Speculum exam: Vagina - Scant amount of creamy, discharge, no odor. One piece of thick, brown, string like discharge  Cervix - No contact bleeding, no active bleeding  Bimanual exam: Cervix closed Uterus non tender, normal size Adnexa non tender, no masses bilaterally, + suprapubic tenderness  Chaperone present for exam.   Musculoskeletal: Normal range of motion.  Neurological: She is alert and oriented to person, place, and time.  Skin: Skin is warm. She is not diaphoretic.    MAU Course  Procedures None  MDM UA +fht Pt says she is A-Positive will  have S.Henryetta Corriveau confirm blood type.  Consulted with Dr. Richardson Dopp; send patient for an [place]  Report given to Sanda Klein CNM who resumes care of the patient  Assessment and Plan    [facility], JENNIFER IRENE NP  01/06/2013, 5:37 PM   Addendum 7pm  S: cramping better O: limited US, anterior placenta, no evidence abruption or previa, normal fluid Cervical length 4.1cm A: vaginal bleeding unknown origin Blood type confirmed Apos  P: d/c home, pelvic rest x1week, motrin PRN, pt reassured  F/u routine Call if heavy bleeding or severe cramping   S.Maryama Kuriakose, CNM

## 2013-01-06 NOTE — MAU Note (Signed)
Pt presents complaining of vaginal spotting and cramping that started this am. States bleeding is light pink. Denies vaginal discharge.

## 2013-01-17 NOTE — L&D Delivery Note (Signed)
Delivery Note At 6:50 PM a viable female [facility]) was delivered via Vaginal, Spontaneous Delivery (Presentation: Right Occiput Anterior) via waterbirth tub.  APGAR: 9, 9; weight .   Placenta status: Intact, Spontaneous.  Cord: 3 vessels with the following complications: None.  Cord pH: n/a 3rd degree laceration noted with brisk bleeding noted.  Right labial laceration noted also with brisk bleeding.  No uterine atony noted.  Pressure applied to laceration.  Dr Raphael Gibney notified, he arrived soon after deliver to complete the repair.  Third degree laceration and right labial laceration repaired in the usual manner.   10 unit pitocin IM given after the delivery of the placenta.  IV initiated, LR bolus given and 100 mcg of fentenyl given for pt comfort.   Pt tolerated repair process well.  Vital signs remained stable.   Called to see pt at 2030 d/t syncopal episode while sitting up to feed the baby.  Pt became hypotensive and pale, O2 sat WNL, IV bolus given.  Dr Raphael Gibney into see pt.   Vital signs stabilized, pt remained pale, responsive to stimulation.   CBC and type and cross for 4 unit ordered.  Pt consented for blood administration.   Foley cath to be inserted.    Anesthesia: Local x 56cc Episiotomy: None Lacerations: 3rd degree perineal and right labial  Suture Repair: 2.0 3.0 vicryl Est. Blood Loss (mL): 750  Mom to remain in L&D until stable.  Baby to Couplet care / Skin to Skin with dad.   Standard, [place] 07/04/2013, 8:41 PM

## 2013-01-25 ENCOUNTER — Other Ambulatory Visit: Payer: Self-pay | Admitting: *Deleted

## 2013-01-25 ENCOUNTER — Ambulatory Visit (INDEPENDENT_AMBULATORY_CARE_PROVIDER_SITE_OTHER): Payer: 59 | Admitting: Sports Medicine

## 2013-01-25 ENCOUNTER — Encounter: Payer: Self-pay | Admitting: Sports Medicine

## 2013-01-25 VITALS — BP 145/77 | HR 65 | Wt 202.0 lb

## 2013-01-25 DIAGNOSIS — L723 Sebaceous cyst: Secondary | ICD-10-CM

## 2013-01-25 MED ORDER — HYDROCODONE-ACETAMINOPHEN 5-325 MG PO TABS: 1.0000 | ORAL_TABLET | Freq: Three times a day (TID) | ORAL | Status: DC | PRN

## 2013-01-25 MED ORDER — CEPHALEXIN 500 MG PO CAPS: 500.0000 mg | ORAL_CAPSULE | Freq: Two times a day (BID) | ORAL | Status: DC

## 2013-01-25 NOTE — Progress Notes (Addendum)
  Subjective:    CC: Pain on scalp  HPI: This pleasant 28 year old female returns, I have been removing sebaceous cysts from her scalp. Unfortunately she's noted a rapid increase in the size of one cyst, pain is moderate, worsening. No fevers, chills or constitutional symptoms, pain is localized without radiation.  Past medical history, Surgical history, Family history not pertinant except as noted below, Social history, Allergies, and medications have been entered into the medical record, reviewed, and no changes needed.   Review of Systems: No fevers, chills, night sweats, weight loss, chest pain, or shortness of breath.   Objective:    General: Well Developed, well nourished, and in no acute distress.  Neuro: Alert and oriented x3, extra-ocular muscles intact, sensation grossly intact.  HEENT: Normocephalic, atraumatic, pupils equal round reactive to light, neck supple, no masses, no lymphadenopathy, thyroid nonpalpable.  Skin: Warm and dry, no rashes. There is a 2.1 cm sebaceous cyst on the top of her scalp. There were no signs of erythema, induration, it is mildly tender to palpation. Cardiac: Regular rate and rhythm, no murmurs rubs or gallops, no lower extremity edema.  Respiratory: Clear to auscultation bilaterally. Not using accessory muscles, speaking in full sentences.  Procedure:  Excision of 2.1 centimeter scalp sebaceous cyst Risks, benefits, and alternatives explained and consent obtained. Time out conducted. Surface prepped with alcohol. 5cc lidocaine with epinephine infiltrated in a field block. Adequate anesthesia ensured. Area prepped and draped in a sterile fashion. Excision performed with: 11 blade used to make an incision over the cyst, using a curved hemostat blunt dissection was carried down to the galea aponeurotica, at that point I was able to fully excise the entirety of the cyst including capsule without rupture, afterwards a single horizontal mattress suture  was placed with 4-0 Prolene. Hemostasis achieved. Pt stable.  Impression and Recommendations:

## 2013-01-25 NOTE — Assessment & Plan Note (Signed)
Large sebaceous cyst removed. Horizontal mattress Prolene sutures placed. Return in one week for suture removal, Keflex prophylactic. Hydrocodone as needed.

## 2013-02-01 ENCOUNTER — Ambulatory Visit (INDEPENDENT_AMBULATORY_CARE_PROVIDER_SITE_OTHER): Payer: 59 | Admitting: Sports Medicine

## 2013-02-01 ENCOUNTER — Encounter: Payer: Self-pay | Admitting: *Deleted

## 2013-02-01 VITALS — BP 120/69 | HR 90 | Wt 209.0 lb

## 2013-02-01 DIAGNOSIS — L723 Sebaceous cyst: Secondary | ICD-10-CM

## 2013-02-01 NOTE — Progress Notes (Signed)
Patient was in office for suture removal. Patient had no signs of fever, discharge, or redness at wound area. Morgen Linebaugh,CMA

## 2013-02-01 NOTE — Assessment & Plan Note (Signed)
Successful excision, sutures removed. Return as needed.

## 2013-03-13 IMAGING — CR DG LUMBAR SPINE COMPLETE 4+V
5 series · 5 of 5 positions shown · non-contrast
Comparison: None.

CLINICAL DATA: Patient fell in 0722 with persistent low back pain

LUMBAR SPINE - COMPLETE 4+ VIEW

[view not recorded (1 of 5)]
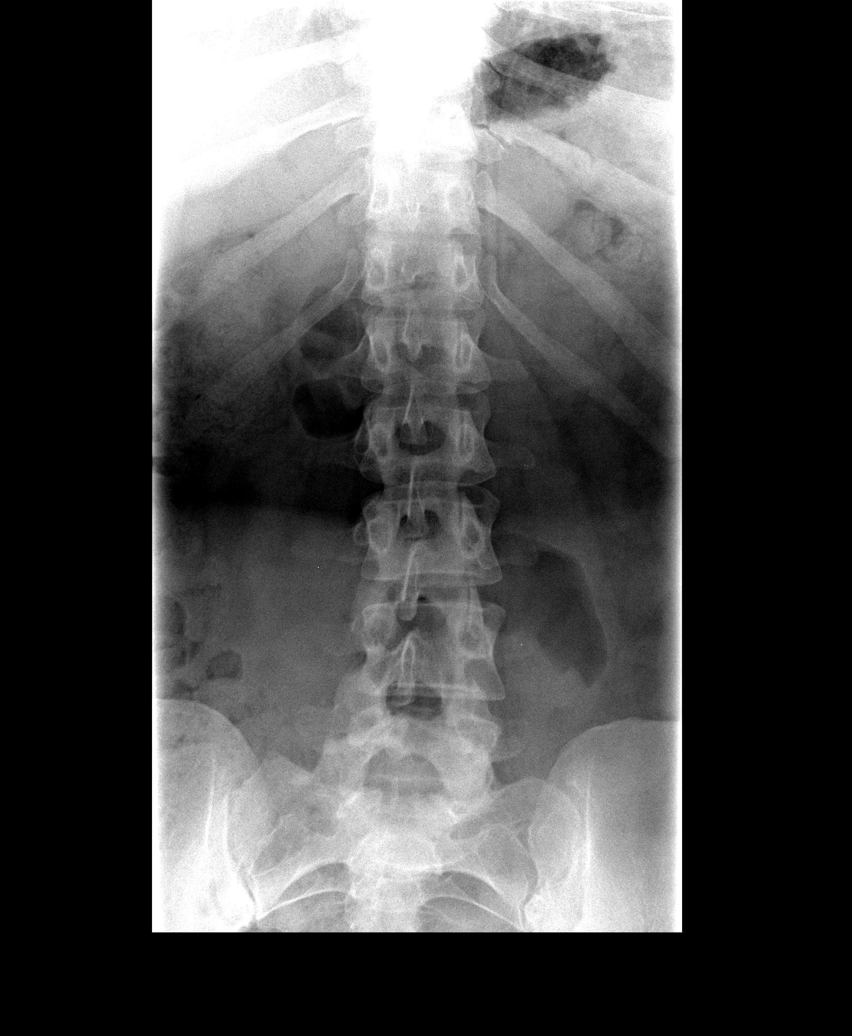

[view not recorded (2 of 5)]
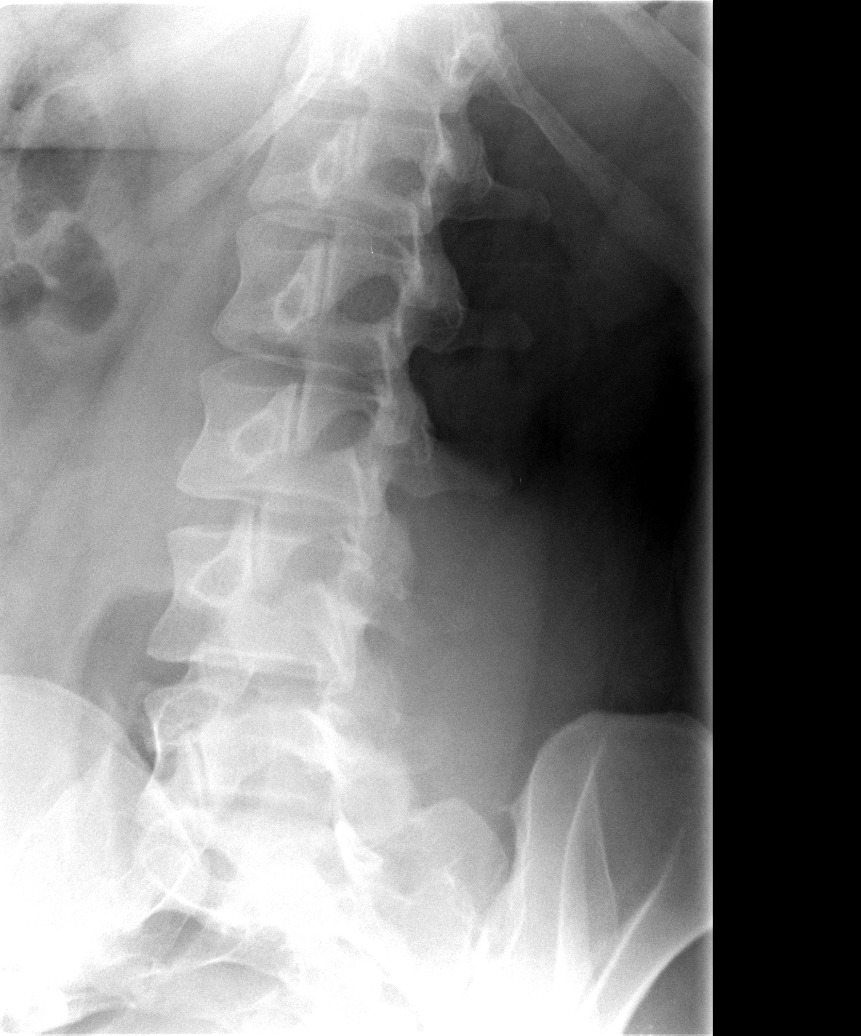

[view not recorded (3 of 5)]
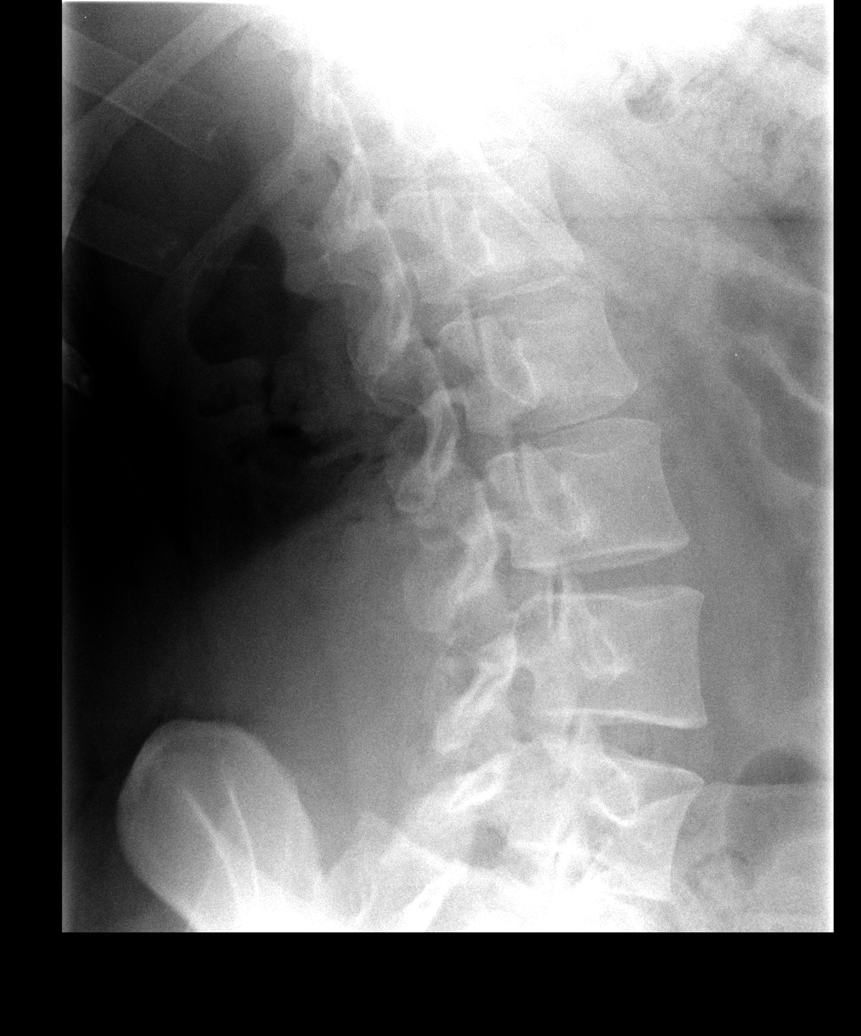

[view not recorded (4 of 5)]
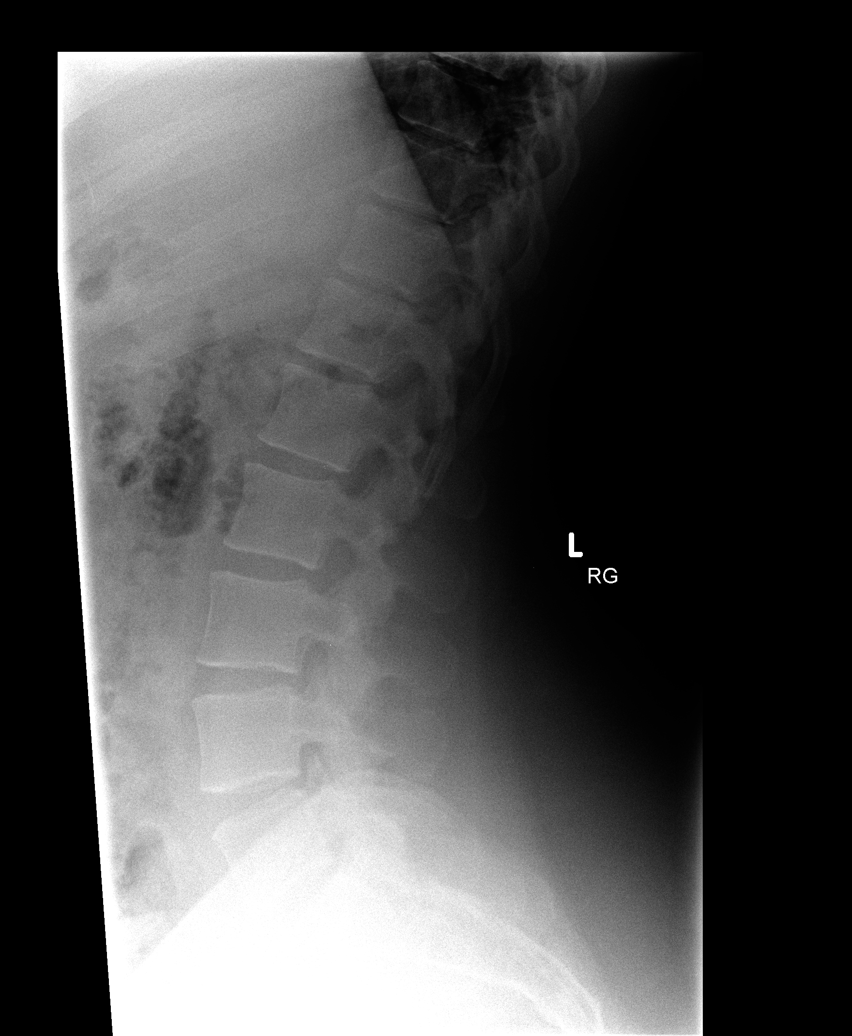

[view not recorded (5 of 5)]
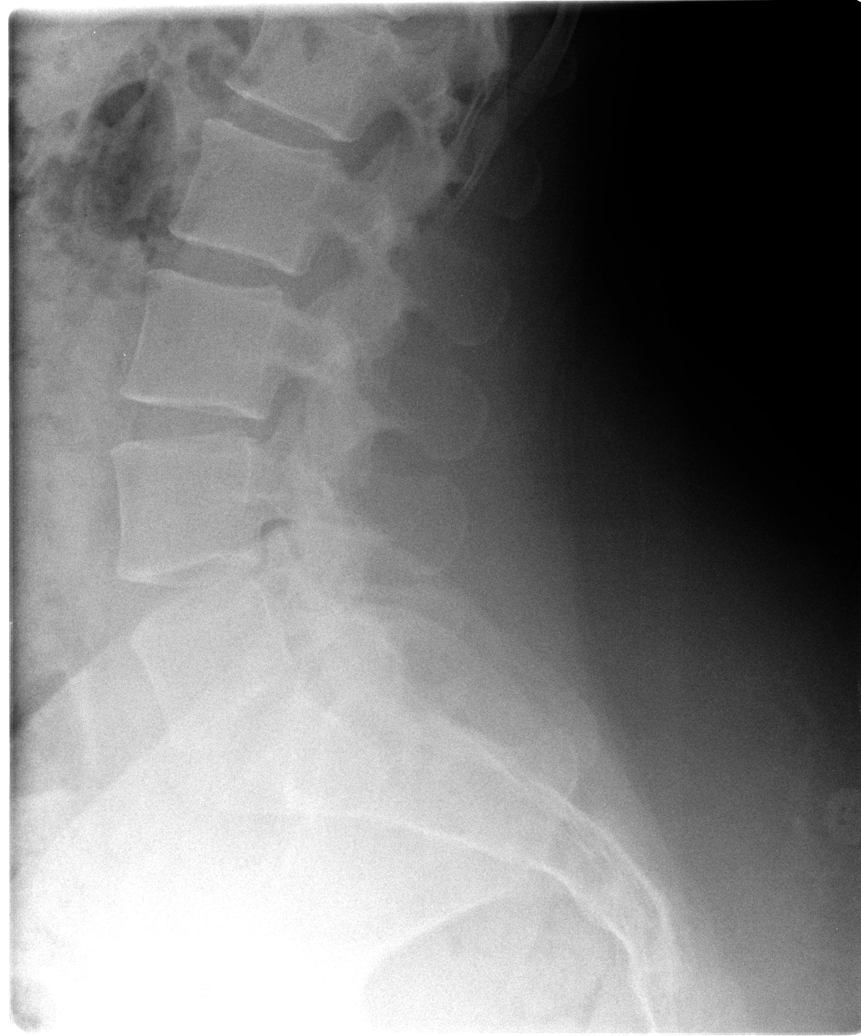

[5 of 5 positions shown; findings below may reference images not displayed]

FINDINGS: The lumbar vertebrae are in normal alignment.
Intervertebral disc spaces appear normal.  No compression deformity
is seen.  The SI joints are corticated.
IMPRESSION: Normal alignment.  Normal intervertebral disc spaces.

## 2013-04-10 ENCOUNTER — Encounter: Payer: Self-pay | Admitting: *Deleted

## 2013-06-08 LAB — OB RESULTS CONSOLE GBS: GBS: NEGATIVE

## 2013-06-11 ENCOUNTER — Encounter: Payer: Self-pay | Admitting: *Deleted

## 2013-06-13 ENCOUNTER — Inpatient Hospital Stay (HOSPITAL_COMMUNITY)
Admission: AD | Admit: 2013-06-13 | Discharge: 2013-06-13 | Disposition: A | Discharge status: Home / Self Care | Payer: 59 | Source: Ambulatory Visit | Attending: Obstetrics and Gynecology | Admitting: Obstetrics and Gynecology

## 2013-06-13 ENCOUNTER — Encounter (HOSPITAL_COMMUNITY): Payer: Self-pay | Admitting: *Deleted

## 2013-06-13 DIAGNOSIS — O9989 Other specified diseases and conditions complicating pregnancy, childbirth and the puerperium: Principal | ICD-10-CM

## 2013-06-13 DIAGNOSIS — O99891 Other specified diseases and conditions complicating pregnancy: Secondary | ICD-10-CM | POA: Insufficient documentation

## 2013-06-13 DIAGNOSIS — O479 False labor, unspecified: Secondary | ICD-10-CM | POA: Insufficient documentation

## 2013-06-13 DIAGNOSIS — H538 Other visual disturbances: Secondary | ICD-10-CM | POA: Insufficient documentation

## 2013-06-13 DIAGNOSIS — R51 Headache: Secondary | ICD-10-CM | POA: Insufficient documentation

## 2013-06-13 LAB — COMPREHENSIVE METABOLIC PANEL
ALBUMIN: 2.5 g/dL — AB (ref 3.5–5.2)
ALT: 7 U/L (ref 0–35)
AST: 11 U/L (ref 0–37)
Alkaline Phosphatase: 144 U/L — ABNORMAL HIGH (ref 39–117)
BUN: 7 mg/dL (ref 6–23)
CALCIUM: 9.1 mg/dL (ref 8.4–10.5)
CO2: 24 mEq/L (ref 19–32)
CREATININE: 0.48 mg/dL — AB (ref 0.50–1.10)
Chloride: 99 mEq/L (ref 96–112)
GFR calc Af Amer: >90 mL/min (ref >90)
GFR calc non Af Amer: >90 mL/min (ref >90)
Glucose, Bld: 111 mg/dL — ABNORMAL HIGH (ref 70–99)
Potassium: 4 mEq/L (ref 3.7–5.3)
Sodium: 137 mEq/L (ref 137–147)
Total Bilirubin: <0.2 mg/dL — ABNORMAL LOW (ref 0.3–1.2)
Total Protein: 6 g/dL (ref 6.0–8.3)

## 2013-06-13 LAB — URINALYSIS, ROUTINE W REFLEX MICROSCOPIC
BILIRUBIN URINE: NEGATIVE
Glucose, UA: NEGATIVE mg/dL
Hgb urine dipstick: NEGATIVE
KETONES UR: NEGATIVE mg/dL
Leukocytes, UA: NEGATIVE
Nitrite: NEGATIVE
PH: 6 (ref 5.0–8.0)
Protein, ur: NEGATIVE mg/dL
Specific Gravity, Urine: <1.005 — ABNORMAL LOW (ref 1.005–1.030)
Urobilinogen, UA: 0.2 mg/dL (ref 0.0–1.0)

## 2013-06-13 LAB — CBC
HCT: 33.7 % — ABNORMAL LOW (ref 36.0–46.0)
Hemoglobin: 10.6 g/dL — ABNORMAL LOW (ref 12.0–15.0)
MCH: 24.6 pg — AB (ref 26.0–34.0)
MCHC: 31.5 g/dL (ref 30.0–36.0)
MCV: 78.2 fL (ref 78.0–100.0)
Platelets: 242 10*3/uL (ref 150–400)
RBC: 4.31 MIL/uL (ref 3.87–5.11)
RDW: 15 % (ref 11.5–15.5)
WBC: 11.6 10*3/uL — ABNORMAL HIGH (ref 4.0–10.5)

## 2013-06-13 LAB — LACTATE DEHYDROGENASE: LDH: 144 U/L (ref 94–250)

## 2013-06-13 LAB — PROTEIN / CREATININE RATIO, URINE
CREATININE, URINE: 49.18 mg/dL
Protein Creatinine Ratio: 0.09 (ref 0.00–0.15)
Total Protein, Urine: 4.6 mg/dL

## 2013-06-13 LAB — URIC ACID: URIC ACID, SERUM: 4.7 mg/dL (ref 2.4–7.0)

## 2013-06-13 IMAGING — CT CT ABD-PELV W/ CM
2 of 4 series · 16 of 46 positions shown, 18 images · IV contrast (APPLIED)
Comparison: None.

CLINICAL DATA: Epigastric pain, nausea and vomiting.

CT ABDOMEN AND PELVIS WITH CONTRAST
TECHNIQUE: Multidetector CT imaging of the abdomen and pelvis was
performed following the standard protocol during bolus
administration of intravenous contrast.
Contrast: 100mL OMNIPAQUE IOHEXOL 300 MG/ML  SOLN, 50mL OMNIPAQUE
IOHEXOL 300 MG/ML  SOLN

[Series 2: abd/pelvis 5.0 b31f · axial · 0.89mm/px · z∈[+769,+1199]mm · 13 of 96 slices shown, 15 images]
[im 5/96  soft-tissue]
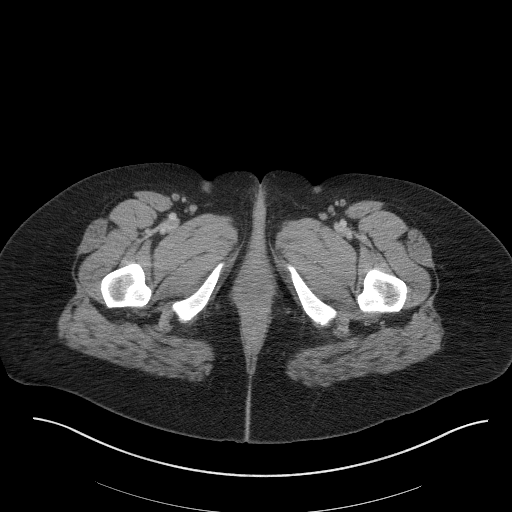
[im 5/96  bone]
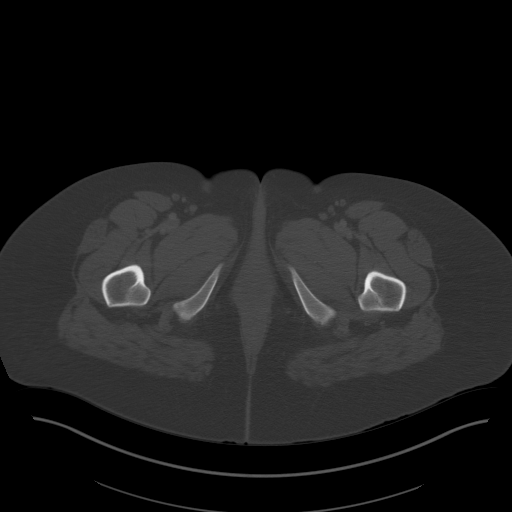
[im 13/96  soft-tissue]
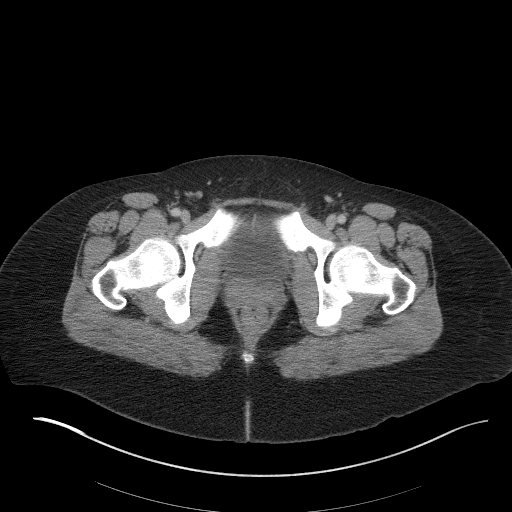
[im 22/96  soft-tissue]
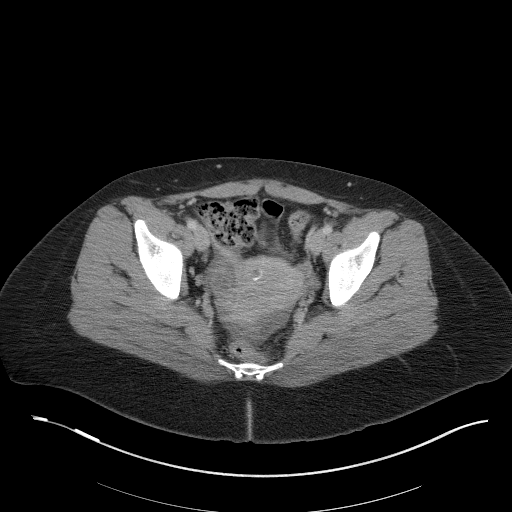
[im 26/96  soft-tissue]
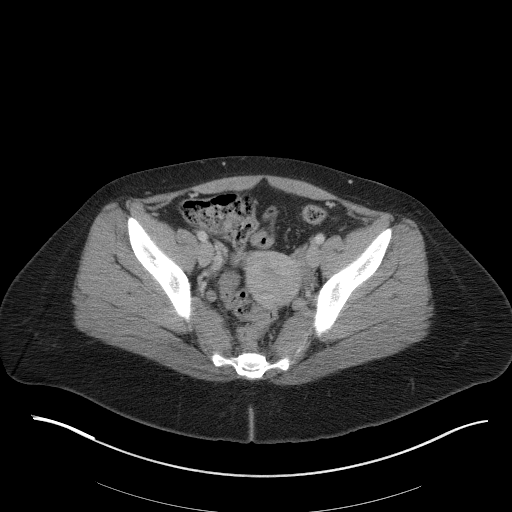
[im 35/96  soft-tissue]
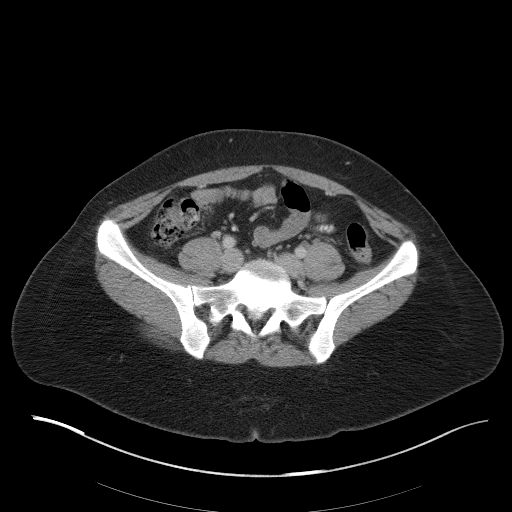
[im 39/96  soft-tissue]
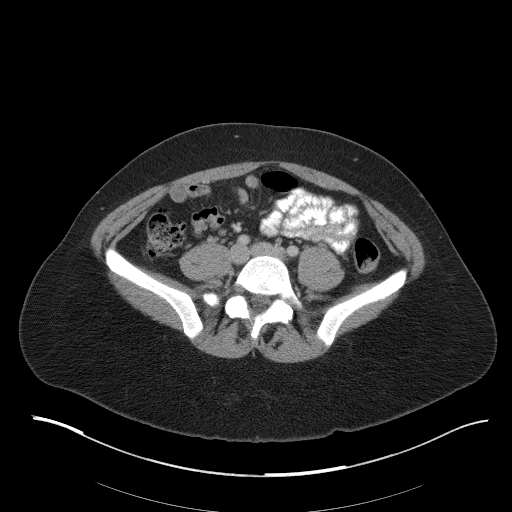
[im 48/96  soft-tissue]
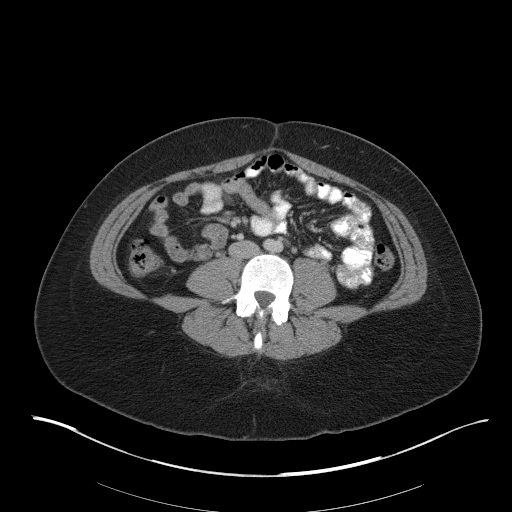
[im 57/96  soft-tissue]
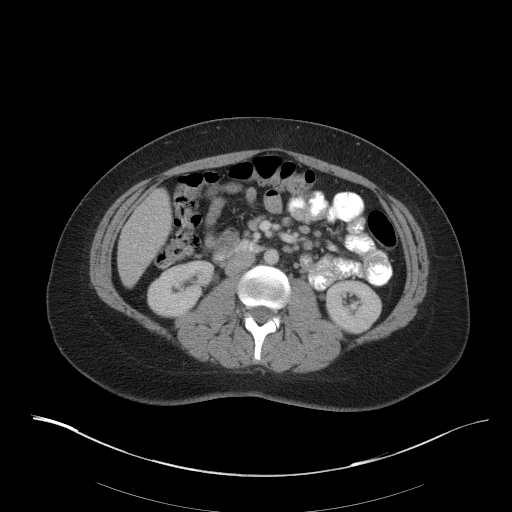
[im 61/96  soft-tissue]
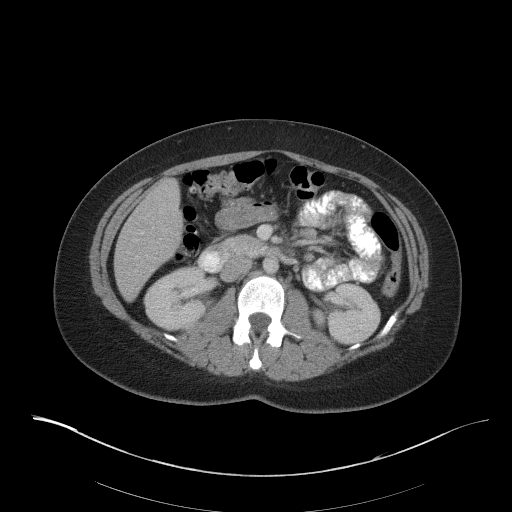
[im 61/96  bone]
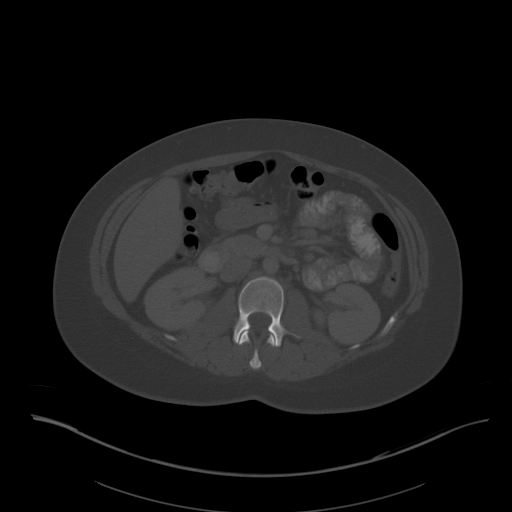
[im 70/96  soft-tissue]
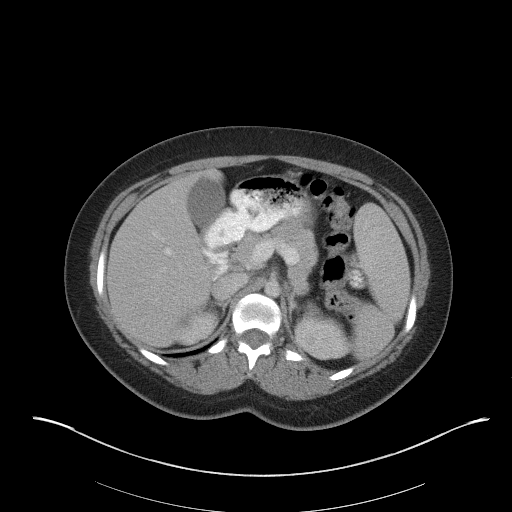
[im 74/96  soft-tissue]
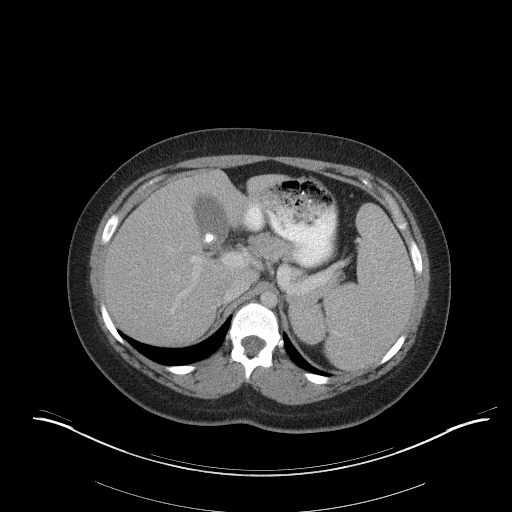
[im 83/96  soft-tissue]
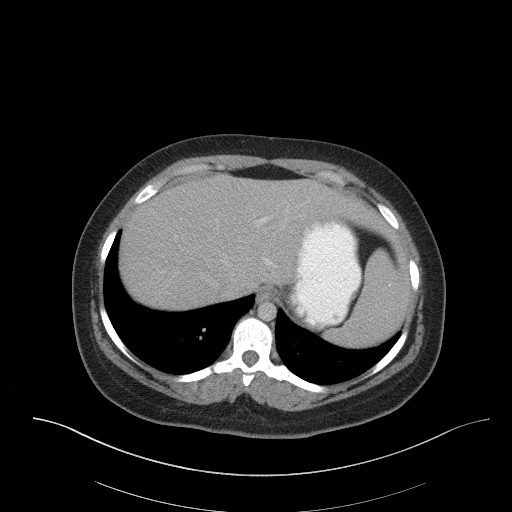
[im 91/96  soft-tissue]
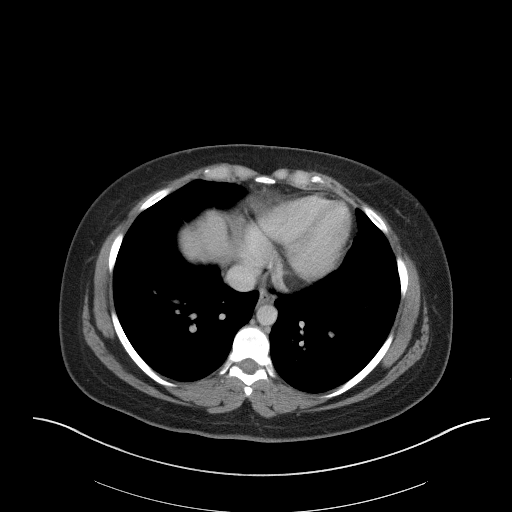

[Series 5: abd/pelvis 3.0 coronal · coronal · 0.81mm/px · 3 of 89 slices shown]
[im 30/89  soft-tissue]
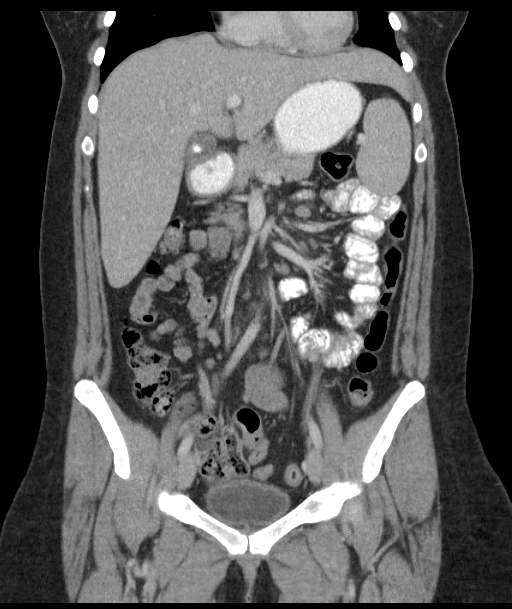
[im 40/89  soft-tissue]
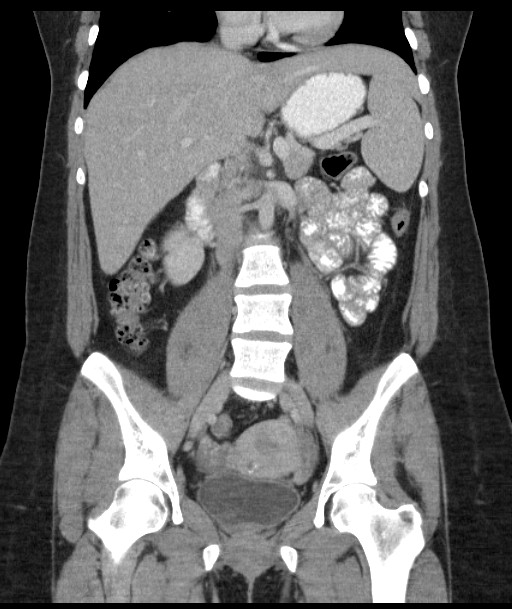
[im 49/89  soft-tissue]
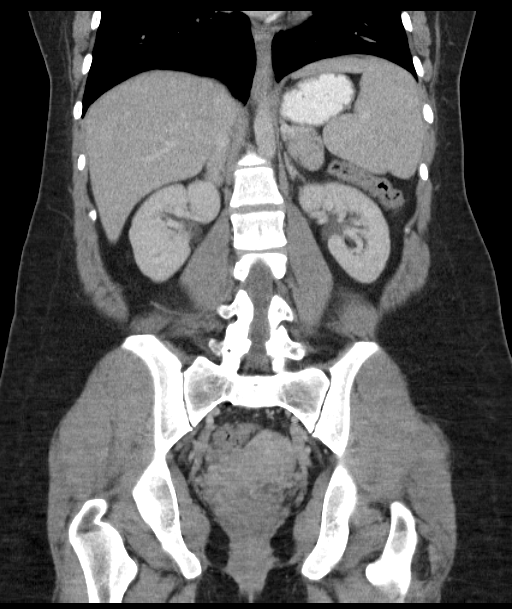

[16 of 46 positions shown; findings below may reference images not displayed]

FINDINGS: The lung bases are clear.

Calcified granulomas in the spleen.  Calcified gallstones.  No
pericholecystic inflammatory changes.  The liver, pancreas, adrenal
glands, kidneys, abdominal aorta, and retroperitoneal lymph nodes
are unremarkable.  The stomach, small bowel, and colon are not
abnormally distended.  No free air or free fluid in the abdomen.
Mesenteric lymph nodes are prominent but not pathologically
enlarged, likely reactive.  No significant mesenteric infiltration
or fluid.

Pelvis:  The uterus is not enlarged.  There is a myometrial
calcification measuring 10 mm consistent with a fibroid.  Ovaries
are not enlarged.  There appears to be involuting cyst in the right
ovary.  Small amount of free fluid in the pelvis likely
representing physiologic fluid.  No loculated pelvic fluid
collections.  No evidence of diverticulitis.  The appendix is
normal.  No significant pelvic lymphadenopathy.  Normal alignment
of the lumbar vertebrae.
IMPRESSION: Cholelithiasis.  Involuting cyst in the right ovary.  Physiologic
fluid in the pelvis.  Nonspecific moderately prominent mesenteric
lymph nodes are most likely to be reactive.

## 2013-06-13 NOTE — Progress Notes (Signed)
While receiving report from K. Jimmye Norman, nurse called stating patient ready to go.  Informed nurse that provider would arrive in 30 minutes to assess patient status.  Nurse called back stating that patient would not wait and was going to leave AMA.  Myself and K. Williams to MAU to assess patient and review results, patient had already left.  Irena Reichmann states that she will follow up with patient in am.  Gavin Pound CNM, MSN 06/13/2013 8:50 PM

## 2013-06-13 NOTE — MAU Provider Note (Signed)
History  28 yo G2P1001 @ 82w0dpresents to MAU w/ c/o frontal h/a, blurred vision and seeing floaters since around 1445 today, a few hours after her office visit. H/A at that time was 6/10, but since arrival, pain has decreased to 3/10. Pain was unrelieved w/ extra strength Tylenol. Denies RUQ pain and N/V.  Reports mild, irregular ctxs and active fetus. Denies VB.  Endorses h/o migraine h/a and occasional sinus pain but states today's h/a is much different than her usual migraines or sinus pressure.  Patient Active Problem List   Diagnosis Date Noted  . Sebaceous cyst 06/08/2012  . Cervical radiculitis 11/23/2011  . Low back pain 11/23/2011  . Hyperlipidemia 11/23/2011  . Preventive measure 11/23/2011    Chief Complaint  Patient presents with  . Headache   HPI See above OB History   Grav Para Term Preterm Abortions TAB SAB Ect Mult Living   '2 1 1       1      ' Past Medical History  Diagnosis Date  . Hyperlipidemia   . Migraines     No past surgical history on file.  Family History  Problem Relation Age of Onset  . Cancer Father   . Hyperlipidemia Father   . Hypertension Father   . Cancer Maternal Grandmother   . Alcohol abuse Maternal Grandfather   . Stroke Paternal Grandmother   . Heart disease Paternal Grandfather   . Hyperlipidemia Paternal Grandfather   . Hypertension Paternal Grandfather     History  Substance Use Topics  . Smoking status: Never Smoker   . Smokeless tobacco: Not on file  . Alcohol Use: No    Allergies:  Allergies  Allergen Reactions  . Clindamycin/Lincomycin Nausea And Vomiting  . Latex Rash    Prescriptions prior to admission  Medication Sig Dispense Refill  . cephALEXin (KEFLEX) 500 MG capsule Take 1 capsule (500 mg total) by mouth 2 (two) times daily.  10 capsule  0  . HYDROcodone-acetaminophen (NORCO/VICODIN) 5-325 MG per tablet Take 1 tablet by mouth every 8 (eight) hours as needed for moderate pain.  40 tablet  0  .  metoCLOPramide (REGLAN) 10 MG tablet Take 10 mg by mouth as needed for nausea (headache).      . ondansetron (ZOFRAN) 4 MG tablet Take 4 mg by mouth every 8 (eight) hours as needed for nausea or vomiting.      . Prenatal Vit-Fe Fumarate-FA (PRENATAL MULTIVITAMIN) TABS tablet Take 1 tablet by mouth daily at 12 noon.      . ranitidine (ZANTAC) 150 MG tablet Take 150 mg by mouth 2 (two) times daily.        ROS +FM +ctxs Blurred vision Headache [place] Physical Exam   Blood pressure 124/67, pulse 97, temperature 98.2 F (36.8 C), temperature source Oral, resp. rate 16, height '5\' 3"'  (1.6 m), weight 222 lb 6.4 oz (100.88 kg), last menstrual period 09/27/2012, SpO2 99.00%.  Recent Results (from the past 2160 hour(s))  PROTEIN / CREATININE RATIO, URINE     Status: None   Collection Time    06/13/13  4:40 PM      Result Value Ref Range   Creatinine, Urine 49.18     Total Protein, Urine 4.6     Comment: NO NORMAL RANGE ESTABLISHED FOR THIS TEST   PROTEIN CREATININE RATIO 0.09  0.00 - 0.15  URINALYSIS, ROUTINE W REFLEX MICROSCOPIC     Status: Abnormal   Collection Time    06/13/13  4:40 PM      Result Value Ref Range   Color, Urine YELLOW  YELLOW   APPearance CLEAR  CLEAR   Specific Gravity, Urine <1.005 (*) 1.005 - 1.030   pH 6.0  5.0 - 8.0   Glucose, UA NEGATIVE  NEGATIVE mg/dL   Hgb urine dipstick NEGATIVE  NEGATIVE   Bilirubin Urine NEGATIVE  NEGATIVE   Ketones, ur NEGATIVE  NEGATIVE mg/dL   Protein, ur NEGATIVE  NEGATIVE mg/dL   Urobilinogen, UA 0.2  0.0 - 1.0 mg/dL   Nitrite NEGATIVE  NEGATIVE   Leukocytes, UA NEGATIVE  NEGATIVE   Comment: MICROSCOPIC NOT DONE ON URINES WITH NEGATIVE PROTEIN, BLOOD, LEUKOCYTES, NITRITE, OR GLUCOSE <1000 mg/dL.  CBC     Status: Abnormal   Collection Time    06/13/13  4:52 PM      Result Value Ref Range   WBC 11.6 (*) 4.0 - 10.5 K/uL   RBC 4.31  3.87 - 5.11 MIL/uL   Hemoglobin 10.6 (*) 12.0 - 15.0 g/dL   HCT 33.7 (*) 36.0 - 46.0 %   MCV  78.2  78.0 - 100.0 fL   MCH 24.6 (*) 26.0 - 34.0 pg   MCHC 31.5  30.0 - 36.0 g/dL   RDW 15.0  11.5 - 15.5 %   Platelets 242  150 - 400 K/uL  COMPREHENSIVE METABOLIC PANEL     Status: Abnormal   Collection Time    06/13/13  4:52 PM      Result Value Ref Range   Sodium 137  137 - 147 mEq/L   Potassium 4.0  3.7 - 5.3 mEq/L   Chloride 99  96 - 112 mEq/L   CO2 24  19 - 32 mEq/L   Glucose, Bld 111 (*) 70 - 99 mg/dL   BUN 7  6 - 23 mg/dL   Creatinine, Ser 0.48 (*) 0.50 - 1.10 mg/dL   Calcium 9.1  8.4 - 10.5 mg/dL   Total Protein 6.0  6.0 - 8.3 g/dL   Albumin 2.5 (*) 3.5 - 5.2 g/dL   AST 11  0 - 37 U/L   ALT 7  0 - 35 U/L   Alkaline Phosphatase 144 (*) 39 - 117 U/L   Total Bilirubin <0.2 (*) 0.3 - 1.2 mg/dL   GFR calc non Af Amer >90  >90 mL/min   GFR calc Af Amer >90  >90 mL/min   Comment: (NOTE)     The eGFR has been calculated using the CKD EPI equation.     This calculation has not been validated in all clinical situations.     eGFR's persistently <90 mL/min signify possible Chronic Kidney     Disease.  LACTATE DEHYDROGENASE     Status: None   Collection Time    06/13/13  4:52 PM      Result Value Ref Range   LDH 144  94 - 250 U/L  URIC ACID     Status: None   Collection Time    06/13/13  4:52 PM      Result Value Ref Range   Uric Acid, Serum 4.7  2.4 - 7.0 mg/dL     Physical Exam Gen: NAD Lungs: CTAB CV: HRRR Abdomen: soft, non-tender Pelvic: 1cm/thick/high/soft Ext: Trace edema; 2+ DTRs, no clonus FHR: Cat 1; baseline 140 Toco: Irregular ctxs    ED Course  Assessment: Acute onset H/A with visual disturbances (resolved) Normal PIH workup  Plan: Reviewed and d/w Dr. Mancel Bale D/C'd home w/  strict labor precautions [place] Keep f/u appt    [place], MS 06/13/2013 4:48 PM

## 2013-06-13 NOTE — MAU Note (Signed)
Patient states she had a regular visit in the office this am and everything was OK. States about 1430 she started a sudden onset of a frontal headache with visual spots. Took Tylenol at 1941 but has not helped. States she has some contractions but are mild, no bleeding or leaking and reports good fetal movement.

## 2013-06-14 NOTE — MAU Provider Note (Signed)
Contacted pt last evening, shortly after she left MAU AMA.   States she had to leave due to childcare concerns and long drive home.   I explained her long wait time and she verbalized understanding.  Discussed w/ pt results of PIH work-up. Advised normal. She expressed that her h/a and visual sxs resolved prior to leaving MAU.  Strict labor and pre-e sxs reviewed w/ pt.  Farrel Gordon, CNM, MS 06/14/13@ 11:04 a.m.

## 2013-07-04 ENCOUNTER — Encounter (HOSPITAL_COMMUNITY): Payer: Self-pay | Admitting: General Practice

## 2013-07-04 ENCOUNTER — Inpatient Hospital Stay (HOSPITAL_COMMUNITY)
Admission: AD | Admit: 2013-07-04 | Discharge: 2013-07-06 | DRG: 774 | Disposition: A | Discharge status: Home / Self Care | Payer: 59 | Source: Ambulatory Visit | Attending: Obstetrics and Gynecology | Admitting: Obstetrics and Gynecology

## 2013-07-04 DIAGNOSIS — K8020 Calculus of gallbladder without cholecystitis without obstruction: Secondary | ICD-10-CM | POA: Insufficient documentation

## 2013-07-04 DIAGNOSIS — O2650 Maternal hypotension syndrome, unspecified trimester: Secondary | ICD-10-CM | POA: Diagnosis not present

## 2013-07-04 DIAGNOSIS — G43909 Migraine, unspecified, not intractable, without status migrainosus: Secondary | ICD-10-CM | POA: Diagnosis not present

## 2013-07-04 DIAGNOSIS — Z91011 Allergy to milk products, unspecified: Secondary | ICD-10-CM | POA: Diagnosis present

## 2013-07-04 DIAGNOSIS — O9903 Anemia complicating the puerperium: Secondary | ICD-10-CM | POA: Diagnosis present

## 2013-07-04 DIAGNOSIS — E669 Obesity, unspecified: Secondary | ICD-10-CM | POA: Diagnosis present

## 2013-07-04 DIAGNOSIS — O26899 Other specified pregnancy related conditions, unspecified trimester: Secondary | ICD-10-CM | POA: Diagnosis present

## 2013-07-04 DIAGNOSIS — Z91040 Latex allergy status: Secondary | ICD-10-CM | POA: Diagnosis present

## 2013-07-04 DIAGNOSIS — IMO0001 Reserved for inherently not codable concepts without codable children: Secondary | ICD-10-CM

## 2013-07-04 DIAGNOSIS — D649 Anemia, unspecified: Secondary | ICD-10-CM | POA: Diagnosis present

## 2013-07-04 DIAGNOSIS — Z789 Other specified health status: Secondary | ICD-10-CM | POA: Diagnosis not present

## 2013-07-04 DIAGNOSIS — E785 Hyperlipidemia, unspecified: Secondary | ICD-10-CM | POA: Diagnosis present

## 2013-07-04 DIAGNOSIS — Z8249 Family history of ischemic heart disease and other diseases of the circulatory system: Secondary | ICD-10-CM

## 2013-07-04 DIAGNOSIS — Z823 Family history of stroke: Secondary | ICD-10-CM

## 2013-07-04 HISTORY — DX: Obesity, unspecified: E66.9

## 2013-07-04 HISTORY — DX: Personal history of other medical treatment: Z92.89

## 2013-07-04 HISTORY — DX: Calculus of gallbladder without cholecystitis without obstruction: K80.20

## 2013-07-04 HISTORY — DX: Anxiety disorder, unspecified: F41.9

## 2013-07-04 LAB — ABO/RH: ABO/RH(D): A POS

## 2013-07-04 LAB — CBC
HCT: 34.2 % — ABNORMAL LOW (ref 36.0–46.0)
HEMATOCRIT: 26.6 % — AB (ref 36.0–46.0)
Hemoglobin: 10.8 g/dL — ABNORMAL LOW (ref 12.0–15.0)
Hemoglobin: 8.4 g/dL — ABNORMAL LOW (ref 12.0–15.0)
MCH: 24.2 pg — ABNORMAL LOW (ref 26.0–34.0)
MCH: 24.6 pg — ABNORMAL LOW (ref 26.0–34.0)
MCHC: 31.6 g/dL (ref 30.0–36.0)
MCHC: 31.6 g/dL (ref 30.0–36.0)
MCV: 76.7 fL — ABNORMAL LOW (ref 78.0–100.0)
MCV: 77.8 fL — ABNORMAL LOW (ref 78.0–100.0)
PLATELETS: 256 10*3/uL (ref 150–400)
Platelets: 266 10*3/uL (ref 150–400)
RBC: 4.46 MIL/uL (ref 3.87–5.11)
RBC: 3.42 MIL/uL — AB (ref 3.87–5.11)
RDW: 15.2 % (ref 11.5–15.5)
RDW: 15.6 % — ABNORMAL HIGH (ref 11.5–15.5)
WBC: 14.8 10*3/uL — AB (ref 4.0–10.5)
WBC: 18.1 10*3/uL — ABNORMAL HIGH (ref 4.0–10.5)

## 2013-07-04 LAB — AMNISURE RUPTURE OF MEMBRANE (ROM) NOT AT ARMC: Amnisure ROM: POSITIVE

## 2013-07-04 LAB — RPR

## 2013-07-04 LAB — PREPARE RBC (CROSSMATCH)

## 2013-07-04 MED ORDER — LACTATED RINGERS IV SOLN
INTRAVENOUS | Status: DC
  Administered 2013-07-04: 900 mL via INTRAVENOUS
  Administered 2013-07-04: 20:00:00 via INTRAVENOUS

## 2013-07-04 MED ORDER — CITRIC ACID-SODIUM CITRATE 334-500 MG/5ML PO SOLN: 30.0000 mL | ORAL | Status: DC | PRN

## 2013-07-04 MED ORDER — MISOPROSTOL 200 MCG PO TABS: 800.0000 ug | ORAL_TABLET | Freq: Once | ORAL | Status: DC

## 2013-07-04 MED ORDER — DIPHENHYDRAMINE HCL 25 MG PO CAPS: 25.0000 mg | ORAL_CAPSULE | Freq: Four times a day (QID) | ORAL | Status: DC | PRN

## 2013-07-04 MED ORDER — PRENATAL MULTIVITAMIN CH
1.0000 | ORAL_TABLET | Freq: Every day | ORAL | Status: DC
  Administered 2013-07-05 – 2013-07-06 (×2): 1 via ORAL
  Filled 2013-07-04 (×2): qty 1

## 2013-07-04 MED ORDER — CARBOPROST TROMETHAMINE 250 MCG/ML IM SOLN: 250.0000 ug | INTRAMUSCULAR | Status: DC | PRN

## 2013-07-04 MED ORDER — WITCH HAZEL-GLYCERIN EX PADS: 1.0000 "application " | MEDICATED_PAD | CUTANEOUS | Status: DC | PRN

## 2013-07-04 MED ORDER — ACETAMINOPHEN 325 MG PO TABS: 650.0000 mg | ORAL_TABLET | ORAL | Status: DC | PRN

## 2013-07-04 MED ORDER — OXYCODONE-ACETAMINOPHEN 5-325 MG PO TABS: 1.0000 | ORAL_TABLET | ORAL | Status: DC | PRN

## 2013-07-04 MED ORDER — DIBUCAINE 1 % RE OINT: 1.0000 "application " | TOPICAL_OINTMENT | RECTAL | Status: DC | PRN

## 2013-07-04 MED ORDER — ONDANSETRON HCL 4 MG/2ML IJ SOLN: 4.0000 mg | INTRAMUSCULAR | Status: DC | PRN

## 2013-07-04 MED ORDER — IBUPROFEN 600 MG PO TABS
600.0000 mg | ORAL_TABLET | Freq: Four times a day (QID) | ORAL | Status: DC | PRN
  Administered 2013-07-04: 600 mg via ORAL
  Filled 2013-07-04: qty 1

## 2013-07-04 MED ORDER — BUTORPHANOL TARTRATE 1 MG/ML IJ SOLN: 1.0000 mg | INTRAMUSCULAR | Status: DC | PRN

## 2013-07-04 MED ORDER — METHYLERGONOVINE MALEATE 0.2 MG/ML IJ SOLN: 0.2000 mg | Freq: Once | INTRAMUSCULAR | Status: DC

## 2013-07-04 MED ORDER — LACTATED RINGERS IV SOLN: 500.0000 mL | INTRAVENOUS | Status: DC | PRN

## 2013-07-04 MED ORDER — TETANUS-DIPHTH-ACELL PERTUSSIS 5-2.5-18.5 LF-MCG/0.5 IM SUSP: 0.5000 mL | Freq: Once | INTRAMUSCULAR | Status: DC

## 2013-07-04 MED ORDER — HYDROCODONE-ACETAMINOPHEN 5-325 MG PO TABS
1.0000 | ORAL_TABLET | ORAL | Status: DC | PRN
  Administered 2013-07-05 (×2): 1 via ORAL
  Administered 2013-07-05 (×2): 2 via ORAL
  Administered 2013-07-05 – 2013-07-06 (×2): 1 via ORAL
  Filled 2013-07-04 (×2): qty 1
  Filled 2013-07-04: qty 2
  Filled 2013-07-04 (×2): qty 1
  Filled 2013-07-04: qty 2

## 2013-07-04 MED ORDER — SENNOSIDES-DOCUSATE SODIUM 8.6-50 MG PO TABS
2.0000 | ORAL_TABLET | ORAL | Status: DC
  Administered 2013-07-05 (×2): 2 via ORAL
  Filled 2013-07-04 (×2): qty 2

## 2013-07-04 MED ORDER — ONDANSETRON HCL 4 MG/2ML IJ SOLN: 4.0000 mg | Freq: Four times a day (QID) | INTRAMUSCULAR | Status: DC | PRN

## 2013-07-04 MED ORDER — LIDOCAINE HCL (PF) 1 % IJ SOLN
30.0000 mL | INTRAMUSCULAR | Status: AC | PRN
  Administered 2013-07-04 (×2): 30 mL via SUBCUTANEOUS
  Filled 2013-07-04 (×2): qty 30

## 2013-07-04 MED ORDER — BENZOCAINE-MENTHOL 20-0.5 % EX AERO
1.0000 "application " | INHALATION_SPRAY | CUTANEOUS | Status: DC | PRN
  Administered 2013-07-05: 1 via TOPICAL
  Filled 2013-07-04: qty 56

## 2013-07-04 MED ORDER — SIMETHICONE 80 MG PO CHEW: 80.0000 mg | CHEWABLE_TABLET | ORAL | Status: DC | PRN

## 2013-07-04 MED ORDER — OXYTOCIN BOLUS FROM INFUSION
500.0000 mL | INTRAVENOUS | Status: DC
  Administered 2013-07-04: 500 mL via INTRAVENOUS

## 2013-07-04 MED ORDER — LANOLIN HYDROUS EX OINT: TOPICAL_OINTMENT | CUTANEOUS | Status: DC | PRN

## 2013-07-04 MED ORDER — OXYTOCIN 40 UNITS IN LACTATED RINGERS INFUSION - SIMPLE MED
62.5000 mL/h | INTRAVENOUS | Status: DC
  Administered 2013-07-04: 500 mL/h via INTRAVENOUS
  Filled 2013-07-04: qty 1000

## 2013-07-04 MED ORDER — IBUPROFEN 600 MG PO TABS
600.0000 mg | ORAL_TABLET | Freq: Four times a day (QID) | ORAL | Status: DC
  Administered 2013-07-05 – 2013-07-06 (×6): 600 mg via ORAL
  Filled 2013-07-04 (×6): qty 1

## 2013-07-04 MED ORDER — ZOLPIDEM TARTRATE 5 MG PO TABS: 5.0000 mg | ORAL_TABLET | Freq: Every evening | ORAL | Status: DC | PRN

## 2013-07-04 MED ORDER — ACETAMINOPHEN 325 MG PO TABS
650.0000 mg | ORAL_TABLET | Freq: Once | ORAL | Status: AC
  Administered 2013-07-05: 650 mg via ORAL
  Filled 2013-07-04: qty 2

## 2013-07-04 MED ORDER — FENTANYL CITRATE 0.05 MG/ML IJ SOLN
INTRAMUSCULAR | Status: AC
  Filled 2013-07-04: qty 2

## 2013-07-04 MED ORDER — OXYTOCIN 40 UNITS IN LACTATED RINGERS INFUSION - SIMPLE MED: 250.0000 mL/h | INTRAVENOUS | Status: DC

## 2013-07-04 MED ORDER — OXYTOCIN 10 UNIT/ML IJ SOLN
INTRAMUSCULAR | Status: AC
  Administered 2013-07-04: 10 [IU]
  Filled 2013-07-04: qty 1

## 2013-07-04 MED ORDER — ONDANSETRON HCL 4 MG PO TABS: 4.0000 mg | ORAL_TABLET | ORAL | Status: DC | PRN

## 2013-07-04 MED ORDER — LACTATED RINGERS IV SOLN: INTRAVENOUS | Status: DC

## 2013-07-04 MED ORDER — FENTANYL CITRATE 0.05 MG/ML IJ SOLN
100.0000 ug | Freq: Once | INTRAMUSCULAR | Status: AC
  Administered 2013-07-04: 100 ug via INTRAVENOUS

## 2013-07-04 MED ORDER — FLEET ENEMA 7-19 GM/118ML RE ENEM: 1.0000 | ENEMA | RECTAL | Status: DC | PRN

## 2013-07-04 MED ORDER — DIPHENHYDRAMINE HCL 50 MG/ML IJ SOLN
25.0000 mg | Freq: Once | INTRAMUSCULAR | Status: AC
  Administered 2013-07-05: 25 mg via INTRAVENOUS
  Filled 2013-07-04: qty 1

## 2013-07-04 NOTE — H&P (Signed)
Michelle Moody is a 28 y.o. female, G2 P1 at 61 0/7 weeks, presenting for labor ctx.  Patient Active Problem List   Diagnosis Date Noted  . 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 Normal labor 07/04/2013  . Gallstones, current 07/04/2013  . Migraine 07/04/2013  . 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   EDC of 07/04/2013 was established by [place].   Anatomy scan:  18 weeks, with normal findings and an anterior placenta.   Additional [place] evaluations:   Significant prenatal events:     Last evaluation:  07/04/2013 in office 2/50/-3 TDap 04/04/2013 Flu 10/30/2012 Waterbirth consent signed at 34 weeks BMI 64  OB History   Grav Para Term Preterm Abortions TAB SAB Ect Mult Living   2 1 1       1      Past Medical History  Diagnosis Date  . Hyperlipidemia   . Migraines    Past Surgical History  Procedure Laterality Date  . No past surgeries     Family History: family history includes Alcohol abuse in her maternal grandfather; Cancer in her father and maternal grandmother; Heart disease in her paternal grandfather; Hyperlipidemia in her father and paternal grandfather; Hypertension in her father and paternal grandfather; Stroke in her paternal grandmother. Social History:  reports that she has never smoked. She does not have any smokeless tobacco history on file. She reports that she does not drink alcohol or use illicit drugs.   Prenatal Transfer Tool  Maternal Diabetes: No Genetic Screening: Normal Maternal Ultrasounds/Referrals: Normal Fetal Ultrasounds or other Referrals:  None Maternal Substance Abuse:  No Significant Maternal Medications:  None Significant Maternal Lab Results: None    ROS:   C/o ctx q 5 minutes, LOF  Allergies  Allergen Reactions  . Clindamycin/Lincomycin Nausea And Vomiting  . Latex Rash     Dilation: 3 Effacement (%): 50 Station: -2 Exam by:: Venus/Vicki Cira Servant, CNM Last menstrual  period 09/27/2012.  Chest clear Heart RRR without murmur Abd gravid, NT, FH  Pelvic: adequate Ext:   FHR: Cat 1 UCs:  Ctx 3-4  Prenatal labs: ABO, Rh:  A+ Antibody:  neg Rubella:   immune RPR:   NR HBsAg:   neg HIV:   neg GBS:  Neg Sickle cell/Hgb electrophoresis:  n/a Pap:   GC:  neg Chlamydia:  neg Genetic screenings:  WNL Glucola:  102 Other:         Assessment/Plan: IUP at 40w 0d, SROM 1530 07/04/2013 clear, Amnisure conformed  Plan:  SVD Desires water birth Delay IV placement    Standard, VenusCNM, [place] 07/04/2013, 5:50 PM

## 2013-07-04 NOTE — Progress Notes (Addendum)
History   27yo G2 P1 at 40w 0d, c/o ctx after membranes were swept this morning.  Ctx have increased in intensity throughout the day.  Desires waterbirth  Patient Active Problem List   Diagnosis Date Noted  . 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 Labor Eval   HPI  OB History   Grav Para Term Preterm Abortions TAB SAB Ect Mult Living   2 1 1       1       Past Medical History  Diagnosis Date  . Hyperlipidemia   . Migraines     Past Surgical History  Procedure Laterality Date  . No past surgeries      Family History  Problem Relation Age of Onset  . Cancer Father   . Hyperlipidemia Father   . Hypertension Father   . Cancer Maternal Grandmother   . Alcohol abuse Maternal Grandfather   . Stroke Paternal Grandmother   . Heart disease Paternal Grandfather   . Hyperlipidemia Paternal Grandfather   . Hypertension Paternal Grandfather     History  Substance Use Topics  . Smoking status: Never Smoker   . Smokeless tobacco: Not on file  . Alcohol Use: No    Allergies:  Allergies  Allergen Reactions  . Clindamycin/Lincomycin Nausea And Vomiting  . Latex Rash    Prescriptions prior to admission  Medication Sig Dispense Refill  . ondansetron (ZOFRAN) 4 MG tablet Take 4 mg by mouth every 8 (eight) hours as needed for nausea or vomiting.      . Prenatal Vit-Fe Fumarate-FA (PRENATAL MULTIVITAMIN) TABS tablet Take 1 tablet by mouth daily at 12 noon.        ROS G2 P1 at 40w 0d  Ctx 4-6 minutes +fm     Physical Exam  VE 2-3/50/-2 Ctx 3-50minutes Cat 1  Abd soft, non tender, no distended  ED Course  Assessment: Early vs prodromal labor Cat 1  Plan:  Allow pt to ambulate for 1 hour, then re-evaluate progress Consider dc to home with labor precaution  Standard, Venus CNM, MSN 07/04/2013 4:26 PM   Addendum: Returned from walking, ctx stronger Reports LOF, amnisure done VE 3/50/-2, ctx 2-4 minutes forebag felt Will  await amnisure result and recheck cervix  Addendum: Amnisure positive, admit to L&D

## 2013-07-04 NOTE — Progress Notes (Signed)

## 2013-07-04 NOTE — MAU Note (Signed)
Pt hd membranes stripped today at office. Ctx stronger. Told to come in. Pt plans a water birth

## 2013-07-05 LAB — CBC
HCT: 28.5 % — ABNORMAL LOW (ref 36.0–46.0)
Hemoglobin: 9.3 g/dL — ABNORMAL LOW (ref 12.0–15.0)
MCH: 26 pg (ref 26.0–34.0)
MCHC: 32.6 g/dL (ref 30.0–36.0)
MCV: 79.6 fL (ref 78.0–100.0)
PLATELETS: 248 10*3/uL (ref 150–400)
RBC: 3.58 MIL/uL — AB (ref 3.87–5.11)
RDW: 15.9 % — AB (ref 11.5–15.5)
WBC: 13.5 10*3/uL — ABNORMAL HIGH (ref 4.0–10.5)

## 2013-07-05 MED ORDER — FERROUS SULFATE 325 (65 FE) MG PO TABS
325.0000 mg | ORAL_TABLET | Freq: Two times a day (BID) | ORAL | Status: DC
  Administered 2013-07-05 – 2013-07-06 (×2): 325 mg via ORAL
  Filled 2013-07-05 (×2): qty 1

## 2013-07-05 NOTE — Lactation Note (Signed)
This note was copied from the chart of Michelle Moody. Lactation Consultation Note: Initial visit with this experienced BF mom who reports that baby is doing awesome at breastfeeding. Baby asleep with mom skin to skin at present. [name]. BF brochure given with resources for support after DC discussed. Mom received blood last night- encouraged to drink plenty of fluids today. No questions at present. To call prn  Patient Name: Michelle Moody [id number] Date: 07/05/2013 Reason for consult: Initial assessment   Maternal Data Formula Feeding for Exclusion: No Infant to breast within first hour of birth: Yes Does the patient have breastfeeding experience prior to this delivery?: Yes  Feeding Feeding Type: Breast Fed  LATCH Score/Interventions                      Lactation Tools Discussed/Used     Consult Status Consult Status: Follow-up Date: 07/06/13 Follow-up type: In-patient    Truddie Crumble 07/05/2013, 11:14 AM

## 2013-07-05 NOTE — Progress Notes (Signed)
Hospital day # 1   Pt in good spirits.  Report taking Vicodin for pain management.  Breastfeeding going well.  Plans to use condoms for contraception.  FOB at the bedside.    Patient Active Problem List   Diagnosis Date Noted  . 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 Gallstones, current 07/04/2013  . Migraine 07/04/2013  . 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 Obese 07/04/2013  . Other and unspecified hyperlipidemia 07/04/2013  . Third-degree perineal laceration, with delivery 07/04/2013  . Postpartum hemorrhage d/t laceration 07/04/2013  . Vaginal delivery 07/04/2013     Objective: Vital signs in last 24 hours: Temp:  [98.1 F (36.7 C)-98.8 F (37.1 C)] 98.5 F (36.9 C) (06/19 0445) Pulse Rate:  [96-128] 98 (06/19 0500) Resp:  [14-20] 18 (06/19 0445) BP: (63-149)/(33-104) 95/60 mmHg (06/19 0500) SpO2:  [96 %-100 %] 96 % (06/19 0330) Weight:  [222 lb (100.699 kg)] 222 lb (100.699 kg) (06/18 1845)  Physical Exam:  General: alert and cooperative Lochia: appropriate Uterine Fundus: firm Abdomen: soft non tender non painful DVT Evaluation: No evidence of DVT seen on physical exam.   Recent Labs  07/04/13 1850 07/04/13 2050 07/05/13 0555  HGB 10.8* 8.4* 9.3*  HCT 34.2* 26.6* 28.5*  WBC 14.8* 18.1* 13.5*     Assessment/Plan: Postpartum Day 1 Normal Involution Breastfeeding 3rd Degree Laceration   DC foley and iv at 24 hours Continue other mgmt as ordered Plan to DC tomorrow    Yafet Cline CNM, [place] 07/05/2013, 2:21 PM

## 2013-07-06 DIAGNOSIS — D649 Anemia, unspecified: Secondary | ICD-10-CM | POA: Diagnosis present

## 2013-07-06 DIAGNOSIS — Z789 Other specified health status: Secondary | ICD-10-CM | POA: Diagnosis not present

## 2013-07-06 DIAGNOSIS — IMO0001 Reserved for inherently not codable concepts without codable children: Secondary | ICD-10-CM

## 2013-07-06 HISTORY — DX: Anemia, unspecified: D64.9

## 2013-07-06 MED ORDER — HYDROCODONE-ACETAMINOPHEN 5-325 MG PO TABS: 1.0000 | ORAL_TABLET | ORAL | Status: DC | PRN

## 2013-07-06 MED ORDER — DOCUSATE SODIUM 100 MG PO CAPS: 100.0000 mg | ORAL_CAPSULE | Freq: Two times a day (BID) | ORAL | Status: DC

## 2013-07-06 MED ORDER — FERROUS SULFATE 325 (65 FE) MG PO TABS: 325.0000 mg | ORAL_TABLET | Freq: Two times a day (BID) | ORAL | Status: DC

## 2013-07-06 MED ORDER — IBUPROFEN 600 MG PO TABS: 600.0000 mg | ORAL_TABLET | Freq: Four times a day (QID) | ORAL | Status: DC

## 2013-07-06 NOTE — Discharge Instructions (Signed)
Postpartum Depression and Baby Blues °The postpartum period begins right after the birth of a baby. During this time, there is often a great amount of joy and excitement. It is also a time of many changes in the life of the parents. Regardless of how many times a mother gives birth, each child brings new challenges and dynamics to the family. It is not unusual to have feelings of excitement along with confusing shifts in moods, emotions, and thoughts. All mothers are at risk of developing postpartum depression or the "baby blues." These mood changes can occur right after giving birth, or they may occur many months after giving birth. The baby blues or postpartum depression can be mild or severe. Additionally, postpartum depression can go away rather quickly, or it can be a long-term condition.  °CAUSES °Raised hormone levels and the rapid drop in those levels are thought to be a main cause of postpartum depression and the baby blues. A number of hormones change during and after pregnancy. Estrogen and progesterone usually decrease right after the delivery of your baby. The levels of thyroid hormone and various cortisol steroids also rapidly drop. Other factors that play a role in these mood changes include major life events and genetics.  °RISK FACTORS °If you have any of the following risks for the baby blues or postpartum depression, know what symptoms to watch out for during the postpartum period. Risk factors that may increase the likelihood of getting the baby blues or postpartum depression include: °· Having a personal or family history of depression.   °· Having depression while being pregnant.   °· Having premenstrual mood issues or mood issues related to oral contraceptives. °· Having a lot of life stress.   °· Having marital conflict.   °· Lacking a social support network.   °· Having a baby with special needs.   °· Having health problems, such as diabetes.   °SIGNS AND SYMPTOMS °Symptoms of baby blues  include: °· Brief changes in mood, such as going from extreme happiness to sadness. °· Decreased concentration.   °· Difficulty sleeping.   °· Crying spells, tearfulness.   °· Irritability.   °· Anxiety.   °Symptoms of postpartum depression typically begin within the first month after giving birth. These symptoms include: °· Difficulty sleeping or excessive sleepiness.   °· Marked weight loss.   °· Agitation.   °· Feelings of worthlessness.   °· Lack of interest in activity or food.   °Postpartum psychosis is a very serious condition and can be dangerous. Fortunately, it is rare. Displaying any of the following symptoms is cause for immediate medical attention. Symptoms of postpartum psychosis include:  °· Hallucinations and delusions.   °· Bizarre or disorganized behavior.   °· Confusion or disorientation.   °DIAGNOSIS  °A diagnosis is made by an evaluation of your symptoms. There are no medical or lab tests that lead to a diagnosis, but there are various questionnaires that a health care provider may use to identify those with the baby blues, postpartum depression, or psychosis. Often, a screening tool called the Edinburgh Postnatal Depression Scale is used to diagnose depression in the postpartum period.  °TREATMENT °The baby blues usually goes away on its own in 1-2 weeks. Social support is often all that is needed. You will be encouraged to get adequate sleep and rest. Occasionally, you may be given medicines to help you sleep.  °Postpartum depression requires treatment because it can last several months or longer if it is not treated. Treatment may include individual or group therapy, medicine, or both to address any social, physiological, and psychological   factors that may play a role in the depression. Regular exercise, a healthy diet, rest, and social support may also be strongly recommended.  Postpartum psychosis is more serious and needs treatment right away. Hospitalization is often needed. HOME CARE  INSTRUCTIONS  Get as much rest as you can. Nap when the baby sleeps.   Exercise regularly. Some women find yoga and walking to be beneficial.   Eat a balanced and nourishing diet.   Do little things that you enjoy. Have a cup of tea, take a bubble bath, read your favorite magazine, or listen to your favorite music.  Avoid alcohol.   Ask for help with household chores, cooking, grocery shopping, or running errands as needed. Do not try to do everything.   Talk to people close to you about how you are feeling. Get support from your partner, family members, friends, or other new moms.  Try to stay positive in how you think. Think about the things you are grateful for.   Do not spend a lot of time alone.   Only take over-the-counter or prescription medicine as directed by your health care provider.  Keep all your postpartum appointments.   Let your health care provider know if you have any concerns.  SEEK MEDICAL CARE IF: You are having a reaction to or problems with your medicine. SEEK IMMEDIATE MEDICAL CARE IF:  You have suicidal feelings.   You think you may harm the baby or someone else. MAKE SURE YOU:  Understand these instructions.  Will watch your condition.  Will get help right away if you are not doing well or get worse. Document Released: 10/08/2003 Document Revised: 01/08/2013 Document Reviewed: 10/15/2012 [facility] Patient Information 2015 [place], [place]. This information is not intended to replace advice given to you by your health care provider. Make sure you discuss any questions you have with your health care provider. Vaginal Laceration A vaginal laceration is a tear in the vaginal wall. A vaginal tear falls into one of three categories:  1. Obstetrically related tears that occur at the time of childbirth. 2. Trauma-related tears (most often related to sexual intercourse). 3. Spontaneous tears. Vaginal tears can cause heavy bleeding (hemorrhaging)  depending on severity of the tear. Tears can be intensely tender and interfere with normal activities of living. They can make sexual intercourse painful and bring on significant burning with urination. If you have a vaginal laceration, the area around your vagina may be painful when you touch or wipe it. Even light pressure from clothing may cause some pain. Vaginal tear need to be evaluated by your caregiver.  CAUSES   Obstetric-related causes, such as childbirth.  Trauma that may result from an accident during an activity, such as sexual intercourse or a bicycle ride.  Spontaneous causes related to aging, failed healing of a past obstetric tear, chronic irritation, or skin changes that are not well understood. SYMPTOMS   Slight to heavy vaginal bleeding.  Vaginal swelling.  Mild to severe pain.  Vaginal tenderness. DIAGNOSIS  If the tear happened during childbirth, your caregiver can diagnose the tear at that time. To diagnose a vaginal tear that happened spontaneously or because of trauma, your caregiver will perform a physical exam. During the physical exam, your caregiver may also look for any signs of trouble that may need further testing. If there is hemorrhaging, your caregiver may suggest blood tests to determine the extent of bleeding. Imaging tests may be performed, such as an ultrasonography or computed tomography (CT), to  look for internal damage. A biopsy may be need if there are signs of a more serious problem.  TREATMENT  Treatment depends on the severity of the tear. For minor tears that heal on their own, treatment may only consist of keeping the area clean and dry. Some tears need to be repaired with stitches. Other tears may heal on their own with help from various remedies, such as antibiotic ointments, medicated creams, or petroleum products. Depending on the circumstances, oral hormones may also be suggested. Hormone remedies may also be in the form of topical creams and  vaginal tablets. For more concerning situations, hospitalization and surgical repair of the tear may be needed. HOME CARE INSTRUCTIONS   Take warm-water baths that cover your hips and buttocks (sitz bath) 2 to 3 times a day. This may help any discomfort and swelling.   Only take over-the-counter or prescription medicines for pain, discomfort, or fever as directed by your caregiver. Do not use aspirin because it can cause increased bleeding.   Do not douche, use tampons, or have intercourse until your caregiver says it is okay.   A bandage (dressing) may have been applied. Change the dressing once a day or as directed. If the dressing sticks, soak it off with warm, soapy water.   Apply ice or witch hazel pads to the vagina to lessen any pain or discomfort.   Take a stool softener or follow a special diet as directed by your caregiver. This will help ease discomfort associated with bowel movements.  SEEK IMMEDIATE MEDICAL CARE IF:   You have redness or swelling in the vaginal area.   You have increasing, sharp, or intense pain or tenderness in the vaginal area.  You have pus or unusual discharge coming from the tear or vagina.   You notice a bad smell coming from the vagina.   Your tear breaks open after it healed or was repaired.   You feel lightheaded.  You have increasing abdominal pain.   You have an increasing or heavy amount of vaginal bleeding.   You have pain with intercourse after the tear heals.  MAKE SURE YOU:  Understand these instructions.  Will watch your condition.  Will get help right away if you are not doing well or get worse. Document Released: 01/03/2005 Document Revised: 09/28/2011 Document Reviewed: 05/23/2011 [facility] Patient Information 2015 [place], [place]. This information is not intended to replace advice given to you by your health care provider. Make sure you discuss any questions you have with your health care provider.

## 2013-07-06 NOTE — Discharge Summary (Signed)
Vaginal Delivery Discharge Summary  Michelle Moody  DOB:    [birth year] MRN:    [id number] CSN:    [id number]  Date of admission:                  07/04/2013  Date of discharge:                   07/06/2013  Procedures this admission: Vaginal Delivery in Water with 3rd Degree Laceration, PPH, and Blood Transfusion  Date of Delivery: 07/04/2013  Newborn Data:  Live born female  Birth Weight: 8 lb 12.7 oz (3989 g) APGAR: 9, 9  Home with mother. Name: Michelle Moody Circumcision Plan: N/A  History of Present Illness:  Ms. Michelle Moody is a 28 y.o. female, G2P2002, who presents at [redacted]w[redacted]d weeks gestation. The patient has been followed at the [facility] and Gynecology division of [facility]. She was admitted onset of labor. Her pregnancy has been complicated by:  Patient Active Problem List   Diagnosis Date Noted  . Breastfeeding (infant) 07/06/2013  . Anemia of mother in pregnancy 07/06/2013  . Blood transfusion during current hospitalization 07/06/2013  . 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 Gallstones, current 07/04/2013  . Migraine 07/04/2013  . 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 Obese 07/04/2013  . Other and unspecified hyperlipidemia 07/04/2013  . Third-degree perineal laceration, with delivery 07/04/2013  . Postpartum hemorrhage d/t laceration 07/04/2013  . Vaginal delivery 07/04/2013    Hospital course:  The patient was admitted for active labor.   Her labor was not complicated. She proceeded to have a vaginal delivery of a healthy infant in a birthing pool. Her delivery was performed by Venus Standard, CNM was complicated by an extensive 3rd degree laceration completed by Dr. Mahalia Longest. She also required blood transfusion due to [facility] and symptomatic anemia.  Her postpartum course was complicated as prior noted, in addition to, utilization of a foley for 24hours post delivery and initiation of iron supplementation.  She was discharged to home on  postpartum day 2 doing well.  Feeding:  breast  Contraception:  condoms  Discharge hemoglobin:  Hemoglobin  Date Value Ref Range Status  07/05/2013 9.3* 12.0 - 15.0 g/dL Final     HCT  Date Value Ref Range Status  07/05/2013 28.5* 36.0 - 46.0 % Final    Discharge Physical Exam:   General: alert, cooperative and no distress Lochia: appropriate Uterine Fundus: firm at umbilicus Incision: N/A DVT Evaluation: No evidence of DVT seen on physical exam. Negative Homan's sign. Calf/Ankle edema is present.  Intrapartum Procedures: spontaneous vaginal delivery Postpartum Procedures: transfusion 2U PRBC Complications-Operative and Postpartum: 3rd degree perineal laceration  Discharge Diagnoses: Term Pregnancy-delivered  Discharge Information:  Activity:           pelvic rest Diet:                routine Medications: Ibuprofen, Colace, Iron and Vicodin Condition:      stable Instructions:  Pain Management, Peri-Care, Breastfeeding, Who and When to call for postpartum complications.  Postpartum Care After Vaginal Delivery  After you deliver your newborn (postpartum period), the usual stay in the hospital is 24 72 hours. If there were problems with your labor or delivery, or if you have other medical problems, you might be in the hospital longer.  While you are in the hospital, you will receive help and instructions on how to care for yourself and your newborn during the postpartum period.  While  you are in the hospital:  Be sure to tell your nurses if you have pain or discomfort, as well as where you feel the pain and what makes the pain worse.  If you had an incision made near your vagina (episiotomy) or if you had some tearing during delivery, the nurses may put ice packs on your episiotomy or tear. The ice packs may help to reduce the pain and swelling.  If you are breastfeeding, you may feel uncomfortable contractions of your uterus for a couple of weeks. This is normal.  The contractions help your uterus get back to normal size.  It is normal to have some bleeding after delivery.  For the first 1 3 days after delivery, the flow is red and the amount may be similar to a period.  It is common for the flow to start and stop.  In the first few days, you may pass some small clots. Let your nurses know if you begin to pass large clots or your flow increases.  Do not  flush blood clots down the toilet before having the nurse look at them.  During the next 3 10 days after delivery, your flow should become more watery and pink or brown-tinged in color.  Ten to fourteen days after delivery, your flow should be a small amount of yellowish-white discharge.  The amount of your flow will decrease over the first few weeks after delivery. Your flow may stop in 6 8 weeks. Most women have had their flow stop by 12 weeks after delivery.  You should change your sanitary pads frequently.  Wash your hands thoroughly with soap and water for at least 20 seconds after changing pads, using the toilet, or before holding or feeding your newborn.  You should feel like you need to empty your bladder within the first 6 8 hours after delivery.  In case you become weak, lightheaded, or faint, call your nurse before you get out of bed for the first time and before you take a shower for the first time.  Within the first few days after delivery, your breasts may begin to feel tender and full. This is called engorgement. Breast tenderness usually goes away within 48 72 hours after engorgement occurs. You may also notice milk leaking from your breasts. If you are not breastfeeding, do not stimulate your breasts. Breast stimulation can make your breasts produce more milk.  Spending as much time as possible with your newborn is very important. During this time, you and your newborn can feel close and get to know each other. Having your newborn stay in your room (rooming in) will help to  strengthen the bond with your newborn. It will give you time to get to know your newborn and become comfortable caring for your newborn.  Your hormones change after delivery. Sometimes the hormone changes can temporarily cause you to feel sad or tearful. These feelings should not last more than a few days. If these feelings last longer than that, you should talk to your caregiver.  If desired, talk to your caregiver about methods of family planning or contraception.  Talk to your caregiver about immunizations. Your caregiver may want you to have the following immunizations before leaving the hospital:  Tetanus, diphtheria, and pertussis (Tdap) or tetanus and diphtheria (Td) immunization. It is very important that you and your family (including grandparents) or others caring for your newborn are up-to-date with the Tdap or Td immunizations. The Tdap or Td immunization can help protect  your newborn from getting ill.  Rubella immunization.  Varicella (chickenpox) immunization.  Influenza immunization. You should receive this annual immunization if you did not receive the immunization during your pregnancy. Document Released: 10/31/2006 Document Revised: 09/28/2011 Document Reviewed: 08/31/2011 [facility] Patient Information 2014 [place].   Postpartum Depression and Baby Blues  The postpartum period begins right after the birth of a baby. During this time, there is often a great amount of joy and excitement. It is also a time of considerable changes in the life of the parent(s). Regardless of how many times a mother gives birth, each child brings new challenges and dynamics to the family. It is not unusual to have feelings of excitement accompanied by confusing shifts in moods, emotions, and thoughts. All mothers are at risk of developing postpartum depression or the "baby blues." These mood changes can occur right after giving birth, or they may occur many months after giving birth. The baby  blues or postpartum depression can be mild or severe. Additionally, postpartum depression can resolve rather quickly, or it can be a long-term condition. CAUSES Elevated hormones and their rapid decline are thought to be a main cause of postpartum depression and the baby blues. There are a number of hormones that radically change during and after pregnancy. Estrogen and progesterone usually decrease immediately after delivering your baby. The level of thyroid hormone and various cortisol steroids also rapidly drop. Other factors that play a major role in these changes include major life events and genetics.  RISK FACTORS If you have any of the following risks for the baby blues or postpartum depression, know what symptoms to watch out for during the postpartum period. Risk factors that may increase the likelihood of getting the baby blues or postpartum depression include:  Havinga personal or family history of depression.  Having depression while being pregnant.  Having premenstrual or oral contraceptive-associated mood issues.  Having exceptional life stress.  Having marital conflict.  Lacking a social support network.  Having a baby with special needs.  Having health problems such as diabetes. SYMPTOMS Baby blues symptoms include:  Brief fluctuations in mood, such as going from extreme happiness to sadness.  Decreased concentration.  Difficulty sleeping.  Crying spells, tearfulness.  Irritability.  Anxiety. Postpartum depression symptoms typically begin within the first month after giving birth. These symptoms include:  Difficulty sleeping or excessive sleepiness.  Marked weight loss.  Agitation.  Feelings of worthlessness.  Lack of interest in activity or food. Postpartum psychosis is a very concerning condition and can be dangerous. Fortunately, it is rare. Displaying any of the following symptoms is cause for immediate medical attention. Postpartum psychosis  symptoms include:  Hallucinations and delusions.  Bizarre or disorganized behavior.  Confusion or disorientation. DIAGNOSIS  A diagnosis is made by an evaluation of your symptoms. There are no medical or lab tests that lead to a diagnosis, but there are various questionnaires that a caregiver may use to identify those with the baby blues, postpartum depression, or psychosis. Often times, a screening tool called the [place] Postnatal Depression Scale is used to diagnose depression in the postpartum period.  TREATMENT The baby blues usually goes away on its own in 1 to 2 weeks. Social support is often all that is needed. You should be encouraged to get adequate sleep and rest. Occasionally, you may be given medicines to help you sleep.  Postpartum depression requires treatment as it can last several months or longer if it is not treated. Treatment may include  individual or group therapy, medicine, or both to address any social, physiological, and psychological factors that may play a role in the depression. Regular exercise, a healthy diet, rest, and social support may also be strongly recommended.  Postpartum psychosis is more serious and needs treatment right away. Hospitalization is often needed. HOME CARE INSTRUCTIONS  Get as much rest as you can. Nap when the baby sleeps.  Exercise regularly. Some women find yoga and walking to be beneficial.  Eat a balanced and nourishing diet.  Do little things that you enjoy. Have a cup of tea, take a bubble bath, read your favorite magazine, or listen to your favorite music.  Avoid alcohol.  Ask for help with household chores, cooking, grocery shopping, or running errands as needed. Do not try to do everything.  Talk to people close to you about how you are feeling. Get support from your partner, family members, friends, or other new moms.  Try to stay positive in how you think. Think about the things you are grateful for.  Do not spend a lot  of time alone.  Only take medicine as directed by your caregiver.  Keep all your postpartum appointments.  Let your caregiver know if you have any concerns. SEEK MEDICAL CARE IF: You are having a reaction or problems with your medicine. SEEK IMMEDIATE MEDICAL CARE IF:  You have suicidal feelings.  You feel you may harm the baby or someone else. Document Released: 10/08/2003 Document Revised: 03/28/2011 Document Reviewed: 11/09/2010 [facility] Patient Information 2014 [place], [place].   Discharge to: home  Follow-up Information   Follow up with [facility]. Schedule an appointment as soon as possible for a visit in 6 weeks. (Please call if you have any questions or concerns prior to your next visit.)    Specialty:  Obstetrics and Gynecology   Contact information:   [address] [place] [address] [phone]       Sedalia Muta CNM, MSN 07/06/2013

## 2013-07-06 NOTE — Plan of Care (Signed)
Problem: Discharge Progression Outcomes Goal: Complications resolved/controlled Outcome: Completed/Met Date Met:  07/06/13 Vaginal bleeding in small in amount and Foley has been removed

## 2013-07-06 NOTE — Plan of Care (Signed)
Problem: Phase I Progression Outcomes Goal: Voiding adequately Outcome: Completed/Met Date Met:  07/06/13 Voided 600cc clear yellow urine after Foley removed. Goal: Other Phase I Outcomes/Goals Outcome: Completed/Met Date Met:  07/06/13 Foley d/c'd and patient voided without difficulty.  Problem: Phase II Progression Outcomes Goal: Progress activity as tolerated unless otherwise ordered Outcome: Completed/Met Date Met:  07/06/13 Tolerates walking in room well.

## 2013-07-07 LAB — TYPE AND SCREEN
ABO/RH(D): A POS
ANTIBODY SCREEN: NEGATIVE
UNIT DIVISION: 0
UNIT DIVISION: 0
Unit division: 0
Unit division: 0

## 2013-07-10 NOTE — Progress Notes (Signed)
Post discharge chart review completed.  

## 2013-09-29 ENCOUNTER — Emergency Department
Admission: EM | Admit: 2013-09-29 | Discharge: 2013-09-29 | Disposition: A | Discharge status: Home / Self Care | Payer: 59 | Source: Home / Self Care | Attending: Family Medicine | Admitting: Family Medicine

## 2013-09-29 DIAGNOSIS — G43829 Menstrual migraine, not intractable, without status migrainosus: Secondary | ICD-10-CM

## 2013-09-29 MED ORDER — IBUPROFEN 800 MG PO TABS
800.0000 mg | ORAL_TABLET | Freq: Once | ORAL | Status: AC
  Administered 2013-09-29: 800 mg via ORAL

## 2013-09-29 MED ORDER — METHYLPREDNISOLONE ACETATE 80 MG/ML IJ SUSP
80.0000 mg | Freq: Once | INTRAMUSCULAR | Status: AC
  Administered 2013-09-29: 80 mg via INTRAMUSCULAR

## 2013-09-29 MED ORDER — PROMETHAZINE HCL 25 MG PO TABS: 25.0000 mg | ORAL_TABLET | Freq: Four times a day (QID) | ORAL | Status: DC | PRN

## 2013-09-29 MED ORDER — PROMETHAZINE HCL 25 MG PO TABS
25.0000 mg | ORAL_TABLET | Freq: Once | ORAL | Status: AC
  Administered 2013-09-29: 25 mg via ORAL

## 2013-09-29 NOTE — ED Notes (Signed)
Patient c/o migraine headache started at 10 pm last night, first one she has had in a year, Relpax has worked in past, she does breast feed.

## 2013-09-29 NOTE — ED Provider Notes (Signed)
Michelle Moody is a 28 y.o. female who presents to Urgent Care today for migraine. Patient developed a migraine headache yesterday evening. Her headache is consistent with prior episodes of migraine. She is about 3 weeks postpartum and just had her first menstrual period after her recent pregnancy. She notes menstrual periods are the typical trigger of her migraines. She denies a visual aura weakness or numbness or trouble with coordination speech or swallow. She notes nausea photophobia and phonophobia which are characteristic for her. She's tried Tylenol and ibuprofen which has not helped. She is currently breast-feeding.   Past Medical History  Diagnosis Date  . Hyperlipidemia   . Migraines   . History of dental surgery     2007  . Gallstones     02/2012  . Anxiety     2011  . Obese    History  Substance Use Topics  . Smoking status: Never Smoker   . Smokeless tobacco: Not on file  . Alcohol Use: No   ROS as above Medications: Current Facility-Administered Medications  Medication Dose Route Frequency Provider Last Rate Last Dose  . ibuprofen (ADVIL,MOTRIN) tablet 800 mg  800 mg Oral Once Gregor Hams, MD       Current Outpatient Prescriptions  Medication Sig Dispense Refill  . Bisacodyl (DULCOLAX PO) Take 100 mg by mouth daily.      Marland Kitchen DOXYCYCLINE PO Take by mouth daily.      . Grapefruit Seed 60 % EXTR 250 mg by Does not apply route daily.      Marland Kitchen ibuprofen (ADVIL,MOTRIN) 600 MG tablet Take 1 tablet (600 mg total) by mouth every 6 (six) hours.  30 tablet  0  . ketoconazole (NIZORAL) 200 MG tablet Take 200 mg by mouth daily.      . Prenatal Vit-Fe Fumarate-FA (PRENATAL MULTIVITAMIN) TABS tablet Take 1 tablet by mouth daily at 12 noon.      . promethazine (PHENERGAN) 25 MG tablet Take 1 tablet (25 mg total) by mouth every 6 (six) hours as needed for nausea or vomiting.  20 tablet  0  . [DISCONTINUED] ferrous sulfate 325 (65 FE) MG tablet Take 1 tablet (325 mg total) by mouth 2  (two) times daily with a meal.  60 tablet  1    Exam:  BP 111/74  Pulse 81  Temp(Src) 98.2 F (36.8 C) (Oral)  Ht 5\' 4"  (1.626 m)  SpO2 98%  LMP 09/28/2013  Breastfeeding? Yes Gen: Well NAD HEENT: EOMI,  MMM PERRLA Lungs: Normal work of breathing. CTABL Heart: RRR no MRG Abd: NABS, Soft. Nondistended, Nontender Exts: Brisk capillary refill, warm and well perfused.  Neuro: And oriented normal coordination reflexes strength and balance. Normal speech and thought process.  Patient was given 800 mg of ibuprofen and 25 mg of Phenergan orally and 80 mg of Depo-Medrol IM.   No results found for this or any previous visit (from the past 24 hour(s)). No results found.  Assessment and Plan: 28 y.o. female with migraine headache related to recent menstruation. This is complicated by patient's breast-feeding status. Discussed options with patient. Further the benefit of Depo-Medrol in this situation outweighs the small risk to the baby was breast-feeding. Recommend abstaining for breast-feeding for about 4 hours after Depo-Medrol.  Continue ibuprofen and Phenergan at home. Followup with primary care provider  Discussed warning signs or symptoms. Please see discharge instructions. Patient expresses understanding.   This note was created using Systems analyst. Any transcription errors are unintended.  Gregor Hams, MD 09/29/13 1250

## 2013-09-29 NOTE — Discharge Instructions (Signed)
Thank you for coming in today. Continue ibuprofen and Phenergan as needed Followup with your primary care provider Go to the emergency room if your headache becomes excruciating or you have weakness or numbness or uncontrolled vomiting.    Migraine Headache A migraine headache is an intense, throbbing pain on one or both sides of your head. A migraine can last for 30 minutes to several hours. CAUSES  The exact cause of a migraine headache is not always known. However, a migraine may be caused when nerves in the brain become irritated and release chemicals that cause inflammation. This causes pain. Certain things may also trigger migraines, such as:  Alcohol.  Smoking.  Stress.  Menstruation.  Aged cheeses.  Foods or drinks that contain nitrates, glutamate, aspartame, or tyramine.  Lack of sleep.  Chocolate.  Caffeine.  Hunger.  Physical exertion.  Fatigue.  Medicines used to treat chest pain (nitroglycerine), birth control pills, estrogen, and some blood pressure medicines. SIGNS AND SYMPTOMS  Pain on one or both sides of your head.  Pulsating or throbbing pain.  Severe pain that prevents daily activities.  Pain that is aggravated by any physical activity.  Nausea, vomiting, or both.  Dizziness.  Pain with exposure to bright lights, loud noises, or activity.  General sensitivity to bright lights, loud noises, or smells. Before you get a migraine, you may get warning signs that a migraine is coming (aura). An aura may include:  Seeing flashing lights.  Seeing bright spots, halos, or zigzag lines.  Having tunnel vision or blurred vision.  Having feelings of numbness or tingling.  Having trouble talking.  Having muscle weakness. DIAGNOSIS  A migraine headache is often diagnosed based on:  Symptoms.  Physical exam.  A CT scan or MRI of your head. These imaging tests cannot diagnose migraines, but they can help rule out other causes of  headaches. TREATMENT Medicines may be given for pain and nausea. Medicines can also be given to help prevent recurrent migraines.  HOME CARE INSTRUCTIONS  Only take over-the-counter or prescription medicines for pain or discomfort as directed by your health care provider. The use of long-term narcotics is not recommended.  Lie down in a dark, quiet room when you have a migraine.  Keep a journal to find out what may trigger your migraine headaches. For example, write down:  What you eat and drink.  How much sleep you get.  Any change to your diet or medicines.  Limit alcohol consumption.  Quit smoking if you smoke.  Get 7-9 hours of sleep, or as recommended by your health care provider.  Limit stress.  Keep lights dim if bright lights bother you and make your migraines worse. SEEK IMMEDIATE MEDICAL CARE IF:   Your migraine becomes severe.  You have a fever.  You have a stiff neck.  You have vision loss.  You have muscular weakness or loss of muscle control.  You start losing your balance or have trouble walking.  You feel faint or pass out.  You have severe symptoms that are different from your first symptoms. MAKE SURE YOU:   Understand these instructions.  Will watch your condition.  Will get help right away if you are not doing well or get worse. Document Released: 01/03/2005 Document Revised: 05/20/2013 Document Reviewed: 09/10/2012 [facility] Patient Information 2015 [place], [place]. This information is not intended to replace advice given to you by your health care provider. Make sure you discuss any questions you have with your health care provider.

## 2013-09-30 ENCOUNTER — Telehealth: Payer: Self-pay | Admitting: Emergency Medicine

## 2013-09-30 NOTE — ED Notes (Signed)
Inquired about patient's status; encourage them to call with questions/concerns.  

## 2013-10-18 ENCOUNTER — Telehealth: Payer: Self-pay

## 2013-10-18 MED ORDER — MUPIROCIN 2 % EX OINT: TOPICAL_OINTMENT | CUTANEOUS | Status: DC

## 2013-10-18 NOTE — Telephone Encounter (Addendum)
Michelle Moody is being treated for Mastitis and nipple infection since June 25 th by Dr. Youlanda Roys. Frederico Hamman, MD Dermatologist Address: [address], [place], [place] [address] [phone]. She has been on multiple antibiotics and anti-fungal mediations. She is still breast feeding. She states her mastitis has not been as bad as the first time she had it. She states her nipple has become more red, shinny and thin. There is also a small blister at the base of her nipple. She wants Bactroban prescription. Dr Frederico Hamman is out of town and not returning her messages. He has not prescribed Bactroban before. Denies fever, chills, sweats, red streaks, dizziness, fainting, confusion, nausea, vomiting or pus. Please advise.

## 2013-10-18 NOTE — Telephone Encounter (Signed)
Prescription called in.  patient was informed.

## 2013-10-25 ENCOUNTER — Encounter: Payer: Self-pay | Admitting: Sports Medicine

## 2013-10-25 ENCOUNTER — Ambulatory Visit (INDEPENDENT_AMBULATORY_CARE_PROVIDER_SITE_OTHER): Payer: 59 | Admitting: Sports Medicine

## 2013-10-25 VITALS — BP 116/71 | HR 83 | Ht 64.0 in | Wt 196.0 lb

## 2013-10-25 DIAGNOSIS — N61 Inflammatory disorders of breast: Secondary | ICD-10-CM

## 2013-10-25 MED ORDER — SULFAMETHOXAZOLE-TRIMETHOPRIM 800-160 MG PO TABS: 1.0000 | ORAL_TABLET | Freq: Two times a day (BID) | ORAL | Status: DC

## 2013-10-25 MED ORDER — CLOTRIMAZOLE-BETAMETHASONE 1-0.05 % EX CREA: 1.0000 "application " | TOPICAL_CREAM | Freq: Two times a day (BID) | CUTANEOUS | Status: DC

## 2013-10-25 NOTE — Assessment & Plan Note (Signed)
Bilateral and lactational. In the past episodes have not responded to dicloxacillin or cephalexin, in fact she is on cephalexin by another provider without any improvement. She is also taking 200 mg of fluconazole without any improvement. She is allergic to clindamycin, we are going to do Septra double strength one tablet twice a day for 7 days, she will and discard milk and bottle feed baby in the meantime. I'm also going to add topical Lotrisone twice a day. Return to see me in one week.

## 2013-10-25 NOTE — Progress Notes (Signed)
  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 More recently she has been on dicloxacillin and cephalexin, and unfortunately has continued to have symptoms. She is also on Diflucan 200 mg every other day. In the past she has been on ketoconazole, she is allergic to clindamycin. She has never been on doxycycline or Septra. Symptoms are moderate, persistent, she has painful nipples with redness on both breasts, no discharge, she has been using topical mupirocin without any improvement.  Past medical history, Surgical history, Family history not pertinant except as noted below, Social history, Allergies, and medications have been entered into the medical record, reviewed, and no changes needed.   Review of Systems: No fevers, chills, night sweats, weight loss, chest pain, or shortness of breath.   Objective:    General: Well Developed, well nourished, and in no acute distress.  Neuro: Alert and oriented x3, extra-ocular muscles intact, sensation grossly intact.  HEENT: Normocephalic, atraumatic, pupils equal round reactive to light, neck supple, no masses, no lymphadenopathy, thyroid nonpalpable.  Skin: Warm and dry, no rashes. Cardiac: Regular rate and rhythm, no murmurs rubs or gallops, no lower extremity edema.  Respiratory: Clear to auscultation bilaterally. Not using accessory muscles, speaking in full sentences. Breasts: Exam conducted with female chaperone, noted erythema over nipples, mild, without any palpable induration, no discharge, no palpable masses in the entirety of each breast.  Impression and Recommendations:

## 2013-10-28 ENCOUNTER — Ambulatory Visit: Payer: 59 | Admitting: Sports Medicine

## 2013-10-28 ENCOUNTER — Telehealth: Payer: Self-pay

## 2013-10-28 NOTE — Telephone Encounter (Signed)
Patient advised.

## 2013-10-28 NOTE — Telephone Encounter (Signed)
Michelle Moody reports has shooting pain in both breast starting at the nipple through the breast while pumping and just after for 3 days. Denies fever, chills or sweats. Please advise.

## 2013-10-28 NOTE — Telephone Encounter (Signed)
This will resolve, would she like a different medicine to try and block the pain while the antibiotics are working?

## 2013-11-01 ENCOUNTER — Encounter: Payer: Self-pay | Admitting: Sports Medicine

## 2013-11-01 ENCOUNTER — Ambulatory Visit (INDEPENDENT_AMBULATORY_CARE_PROVIDER_SITE_OTHER): Payer: 59 | Admitting: Sports Medicine

## 2013-11-01 VITALS — BP 120/74 | HR 90 | Ht 64.0 in | Wt 199.0 lb

## 2013-11-01 DIAGNOSIS — N61 Inflammatory disorders of breast: Secondary | ICD-10-CM

## 2013-11-01 MED ORDER — DICLOFENAC SODIUM 75 MG PO TBEC: 75.0000 mg | DELAYED_RELEASE_TABLET | Freq: Two times a day (BID) | ORAL | Status: DC

## 2013-11-01 NOTE — Progress Notes (Signed)
  Subjective:    CC: Followup  HPI: Michelle Moody returns, I saw her last week, we had been treating her for suspected mastitis, initially she had been on Keflex, and Diflucan by another provider, we added sulfamethoxazole and trimethoprim, as well as topical Lotrisone cream. Her exam was relatively benign at the previous visit with the exception of some cracking around the nipples. She tells [place] that she continues to have pain, and describes it as a shooting sensation along the top and bottom of her left breast worse than the right. She has been pumping and dumping her breast milk while on the antibiotics.  Past medical history, Surgical history, Family history not pertinant except as noted below, Social history, Allergies, and medications have been entered into the medical record, reviewed, and no changes needed.   Review of Systems: No fevers, chills, night sweats, weight loss, chest pain, or shortness of breath.   Objective:    General: Well Developed, well nourished, and in no acute distress.  Neuro: Alert and oriented x3, extra-ocular muscles intact, sensation grossly intact.  HEENT: Normocephalic, atraumatic, pupils equal round reactive to light, neck supple, no masses, no lymphadenopathy, thyroid nonpalpable.  Skin: Warm and dry, no rashes. Cardiac: Regular rate and rhythm, no murmurs rubs or gallops, no lower extremity edema.  Respiratory: Clear to auscultation bilaterally. Not using accessory muscles, speaking in full sentences. Breasts: Symmetrical, only minimally tender to palpation, no erythema, nipple retraction, no nipple discharge, no nipple cracking. No palpable masses.  Impression and Recommendations:

## 2013-11-01 NOTE — Patient Instructions (Signed)
Breast Engorgement  Breast engorgement is the overfilling of your breasts with breast milk. In the first few weeks after giving birth, you may experience breast engorgement. Although it is normal for your breasts to feel heavy, full, and uncomfortable within 3-5 days of giving birth, breast engorgement can make your breasts throb and feel hard, tightly stretched, warm, and tender. Engorgement peaks about the fifth day after you give birth. Breast engorgement can be easily treated and does not require you to stop breastfeeding.  CAUSES OF BREAST ENGORGEMENT Some women delay feedings because of sore or cracked nipples, which can lead to engorgement. Cracked and sore nipples often are caused by inadequate latching (when your baby's mouth attaches to your breast to breastfeed). If your baby is latched on properly, he or she should be able to breastfeed as long as needed, without causing any pain. If you do feel pain while breastfeeding, take your baby off your breast and try again. Get help from your health care provider or a lactation consultant if you continue to have pain. Other causes of engorgement include:   Improper position of your baby while breastfeeding.  Allowing too much time to pass between feedings.  Reduction in breastfeeding because you give your baby water, juice, formula, breast milk from a bottle, or a pacifier instead of breastfeeding.  Changes in your baby's feeding patterns.  Weak sucking from your baby, which causes less milk to be taken out of your breast during feedings.  Fatigue, stress, anemia.  Plugged milk ducts.  A history of breast surgery. SIGNS AND SYMPTOMS OF BREAST ENGORGEMENT If your breasts become engorged, you may experience:   Breast swelling, tenderness, warmth, redness, or throbbing.  Breast hardness and stretching of the skin around your breast.  Flattening, tightening, and hardening of your nipple.  A low-grade fever, which can be confused with a  breast infection. RECOMMENDATIONS TO EASE BREAST ENGORGEMENT Breast engorgement should improve in 24-48 hours after following these recommendations:  Breastfeed when you feel the need to reduce the fullness of your breasts or when your baby shows signs of hunger. This is called "breastfeeding on demand."  Newborns (babies younger than 4 weeks) often breastfeed every 1-3 hours during the day. You may need to awaken your baby to feed if he or she is asleep at a feeding time.  Do not allow your baby to sleep longer than 5 hours during the night without a feeding.  Pump or hand-express breast milk before breastfeeding to soften your breast, areola, and nipple.   Apply warm, moist heat (in the shower or with warm water-soaked hand towels) just before feeding or pumping, or massage your breast before or during breastfeeding. This increases circulation and helps your milk to flow.  Completely empty your breasts when breastfeeding or pumping. Afterward, wear a snug bra (nursing or regular) or tank top for 1-2 days to signal your body to slightly decrease milk production. Only wear snug bras or tank tops to treat engorgement. Tight bras typically should be avoided by breastfeeding mothers. Once engorgement is relieved, return to wearing regular, loose-fitting clothes.  Apply ice packs to your breasts to lessen the pain from engorgement and relieve swelling, unless the ice is uncomfortable for you.  Do not delay feedings. Try to relax when it is time to feed your baby. This helps to trigger your "let-down reflex," which releases milk from your breast.  Ensure your baby is latched on to your breast and positioned properly while breastfeeding.  Allow your baby to remain at your breast as long as he or she is latched on well and actively sucking. Your baby will let you know when he or she is done breastfeeding by pulling away from your breast or falling asleep.  Avoid introducing bottles or pacifiers  to your baby in the early weeks of breastfeeding. Wait to introduce these things until after resolving any breastfeeding challenges.  Try to pump your milk on the same schedule as when your baby would breastfeed if you are returning to work or away from home for an extended period.  Drink plenty of fluids to avoid dehydration, which can eventually put you at greater risk of breast engorgement.  CALL YOUR HEALTH CARE PROVIDER OR LACTATION CONSULTANT IF:   Engorgement lasts longer than 2 days, even after treatment.  You have flu-like symptoms, such as a fever, chills, or body aches.  Your breasts become increasingly red and painful. Document Released: 04/30/2004 Document Revised: 09/05/2012 Document Reviewed: 06/28/2012 [facility] Patient Information 2015 [place], [place]. This information is not intended to replace advice given to you by your health care provider. Make sure you discuss any questions you have with your health care provider.

## 2013-11-01 NOTE — Assessment & Plan Note (Signed)
Examination is highly benign today. There is no further erythema or cracking in the nipples. I do not feel any collections. I am going to obtain a breast ultrasound, I do think her symptoms are more related to engorgement rather than mastitis at this point. Adding Voltaren twice a day, this is safe for lactation.

## 2013-11-06 ENCOUNTER — Emergency Department
Admission: EM | Admit: 2013-11-06 | Discharge: 2013-11-06 | Disposition: A | Discharge status: Home / Self Care | Payer: 59 | Source: Home / Self Care | Attending: Family Medicine | Admitting: Family Medicine

## 2013-11-06 ENCOUNTER — Encounter: Payer: Self-pay | Admitting: Emergency Medicine

## 2013-11-06 DIAGNOSIS — G43019 Migraine without aura, intractable, without status migrainosus: Secondary | ICD-10-CM

## 2013-11-06 MED ORDER — METHYLPREDNISOLONE ACETATE 80 MG/ML IJ SUSP
80.0000 mg | Freq: Once | INTRAMUSCULAR | Status: AC
Start: 2013-11-06 — End: 2013-11-06
  Administered 2013-11-06: 80 mg via INTRAMUSCULAR

## 2013-11-06 MED ORDER — PROMETHAZINE HCL 25 MG PO TABS: 25.0000 mg | ORAL_TABLET | Freq: Four times a day (QID) | ORAL | Status: DC | PRN

## 2013-11-06 MED ORDER — IBUPROFEN 800 MG PO TABS
800.0000 mg | ORAL_TABLET | Freq: Once | ORAL | Status: AC
  Administered 2013-11-06: 800 mg via ORAL

## 2013-11-06 MED ORDER — PROMETHAZINE HCL 25 MG PO TABS
25.0000 mg | ORAL_TABLET | Freq: Four times a day (QID) | ORAL | Status: DC | PRN
  Administered 2013-11-06: 25 mg via ORAL

## 2013-11-06 NOTE — ED Notes (Signed)
Pt c/o migraine with nausea and a little dizziness x 1 day.

## 2013-11-06 NOTE — Discharge Instructions (Signed)
Thank you for coming in today. Ibuprofen and Phenergan as needed for migraine. Followup with your primary care Dr. Soon Go to the emergency room if your headache becomes excruciating or you have weakness or numbness or uncontrolled vomiting.    Recurrent Migraine Headache A migraine headache is an intense, throbbing pain on one or both sides of your head. Recurrent migraines keep coming back. A migraine can last for 30 minutes to several hours. CAUSES  The exact cause of a migraine headache is not always known. However, a migraine may be caused when nerves in the brain become irritated and release chemicals that cause inflammation. This causes pain. Certain things may also trigger migraines, such as:   Alcohol.  Smoking.  Stress.  Menstruation.  Aged cheeses.  Foods or drinks that contain nitrates, glutamate, aspartame, or tyramine.  Lack of sleep.  Chocolate.  Caffeine.  Hunger.  Physical exertion.  Fatigue.  Medicines used to treat chest pain (nitroglycerine), birth control pills, estrogen, and some blood pressure medicines. SYMPTOMS   Pain on one or both sides of your head.  Pulsating or throbbing pain.  Severe pain that prevents daily activities.  Pain that is aggravated by any physical activity.  Nausea, vomiting, or both.  Dizziness.  Pain with exposure to bright lights, loud noises, or activity.  General sensitivity to bright lights, loud noises, or smells. Before you get a migraine, you may get warning signs that a migraine is coming (aura). An aura may include:  Seeing flashing lights.  Seeing bright spots, halos, or zigzag lines.  Having tunnel vision or blurred vision.  Having feelings of numbness or tingling.  Having trouble talking.  Having muscle weakness. DIAGNOSIS  A recurrent migraine headache is often diagnosed based on:  Symptoms.  Physical examination.  A CT scan or MRI of your head. These imaging tests cannot diagnose  migraines but can help rule out other causes of headaches.  TREATMENT  Medicines may be given for pain and nausea. Medicines can also be given to help prevent recurrent migraines. HOME CARE INSTRUCTIONS  Only take over-the-counter or prescription medicines for pain or discomfort as directed by your health care provider. The use of long-term narcotics is not recommended.  Lie down in a dark, quiet room when you have a migraine.  Keep a journal to find out what may trigger your migraine headaches. For example, write down:  What you eat and drink.  How much sleep you get.  Any change to your diet or medicines.  Limit alcohol consumption.  Quit smoking if you smoke.  Get 7-9 hours of sleep, or as recommended by your health care provider.  Limit stress.  Keep lights dim if bright lights bother you and make your migraines worse. SEEK MEDICAL CARE IF:   You do not get relief from the medicines given to you.  You have a recurrence of pain.  You have a fever. SEEK IMMEDIATE MEDICAL CARE IF:  Your migraine becomes severe.  You have a stiff neck.  You have loss of vision.  You have muscular weakness or loss of muscle control.  You start losing your balance or have trouble walking.  You feel faint or pass out.  You have severe symptoms that are different from your first symptoms. MAKE SURE YOU:   Understand these instructions.  Will watch your condition.  Will get help right away if you are not doing well or get worse. Document Released: 09/28/2000 Document Revised: 05/20/2013 Document Reviewed: 09/10/2012 [employer] Patient  Information 2015 [place], [place]. This information is not intended to replace advice given to you by your health care provider. Make sure you discuss any questions you have with your health care provider.

## 2013-11-06 NOTE — ED Provider Notes (Signed)
Michelle Moody is a 28 y.o. female who presents to Urgent Care today for migraine. Patient is a one-day history of severe migraine. Her current headache is consistent with previous episodes of migraine. She has photophobia and nausea. She denies any vomiting weakness or numbness or loss of function. She's not tried any medications yet. She was seen for a similar complaint in September and treated with oral ibuprofen Phenergan intramuscular Depo-Medrol. She is currently nursing.   Past Medical History  Diagnosis Date  . Hyperlipidemia   . Migraines   . History of dental surgery     2007  . Gallstones     02/2012  . Anxiety     2011  . Obese    History  Substance Use Topics  . Smoking status: Never Smoker   . Smokeless tobacco: Not on file  . Alcohol Use: No   ROS as above Medications: Current Facility-Administered Medications  Medication Dose Route Frequency Provider Last Rate Last Dose  . ibuprofen (ADVIL,MOTRIN) tablet 800 mg  800 mg Oral Once Gregor Hams, MD      . methylPREDNISolone acetate (DEPO-MEDROL) injection 80 mg  80 mg Intramuscular Once Gregor Hams, MD      . promethazine (PHENERGAN) tablet 25 mg  25 mg Oral Q6H PRN Gregor Hams, MD       Current Outpatient Prescriptions  Medication Sig Dispense Refill  . ferrous fumarate (HEMOCYTE - 106 MG FE) 325 (106 FE) MG TABS tablet Take 1 tablet by mouth.      Marland Kitchen GARLIC PO Take by mouth.      . Grapefruit Seed 60 % EXTR 250 mg by Does not apply route daily.      . Prenatal Vit-Fe Fumarate-FA (PRENATAL MULTIVITAMIN) TABS tablet Take 1 tablet by mouth daily at 12 noon.      . Bisacodyl (DULCOLAX PO) Take 100 mg by mouth daily.      . clotrimazole-betamethasone (LOTRISONE) cream Apply 1 application topically 2 (two) times daily.  45 g  0  . diclofenac (VOLTAREN) 75 MG EC tablet Take 1 tablet (75 mg total) by mouth 2 (two) times daily.  60 tablet  3  . [DISCONTINUED] ferrous sulfate 325 (65 FE) MG tablet Take 1 tablet (325 mg  total) by mouth 2 (two) times daily with a meal.  60 tablet  1    Exam:  BP 112/73  Pulse 80  Temp(Src) 98 F (36.7 C) (Oral)  Resp 16  Ht 5\' 4"  (1.626 m)  Wt 199 lb (90.266 kg)  BMI 34.14 kg/m2  SpO2 97%  LMP 10/28/2013  Breastfeeding? Yes Gen: Well NAD HEENT: EOMI,  MMM PERRL.  Lungs: Normal work of breathing. CTABL Heart: RRR no MRG Abd: NABS, Soft. Nondistended, Nontender Exts: Brisk capillary refill, warm and well perfused.  Neuro: Alert and oriented cranial nerves II through XII are intact normal coordination balance sensation reflexes and strength. Normal gait  Patient was given 800 mg of oral ibuprofen, and 25 mg oral Phenergan, and eating milligrams of intramuscular Phenergan. She felt better  No results found for this or any previous visit (from the past 24 hour(s)). No results found.  Assessment and Plan: 28 y.o. female with migraine related to menstrual cycle. Treatment as above. Followup with PCP to consider migraine prophylaxis.  Discussed warning signs or symptoms. Please see discharge instructions. Patient expresses understanding.     Gregor Hams, MD 11/06/13 [phone]

## 2013-11-08 ENCOUNTER — Ambulatory Visit: Payer: 59 | Admitting: Sports Medicine

## 2013-11-11 ENCOUNTER — Ambulatory Visit
Admission: RE | Admit: 2013-11-11 | Discharge: 2013-11-11 | Disposition: A | Discharge status: Home / Self Care | Payer: 59 | Source: Ambulatory Visit | Attending: Sports Medicine | Admitting: Sports Medicine

## 2013-11-11 DIAGNOSIS — N61 Mastitis without abscess: Secondary | ICD-10-CM

## 2013-11-13 ENCOUNTER — Telehealth: Payer: Self-pay

## 2013-11-13 NOTE — Telephone Encounter (Signed)
Patient called stated that she feels like her Mastitis has come back again, she stated that her breast is sore, red inflamed and hot to the touch she has taken Motrin for her pain and she has a scheduled follow up appt for 11/19/2013. She wants to know what she should do in the meantime. Michelle Moody,CMA

## 2013-11-13 NOTE — Telephone Encounter (Signed)
Her imaging was negative for any collections and the sonographer and radiologist saw no evidence of mastitis.  Breasts were normal at last examination without any evidence of mastitis.  I would recommend she talk to her OBGYN, I'd be happy to call in some pain medication in the meantime if she wants, will probably be hydrocodone.

## 2013-11-14 NOTE — Telephone Encounter (Signed)
Patient has been informed. Takaya Hyslop,CMA  

## 2013-11-15 ENCOUNTER — Ambulatory Visit (INDEPENDENT_AMBULATORY_CARE_PROVIDER_SITE_OTHER): Payer: 59

## 2013-11-15 ENCOUNTER — Ambulatory Visit (INDEPENDENT_AMBULATORY_CARE_PROVIDER_SITE_OTHER): Payer: 59 | Admitting: Sports Medicine

## 2013-11-15 ENCOUNTER — Encounter: Payer: Self-pay | Admitting: Sports Medicine

## 2013-11-15 VITALS — BP 112/72 | HR 81 | Wt 200.0 lb

## 2013-11-15 DIAGNOSIS — N61 Inflammatory disorders of breast: Secondary | ICD-10-CM

## 2013-11-15 DIAGNOSIS — M542 Cervicalgia: Secondary | ICD-10-CM

## 2013-11-15 DIAGNOSIS — M6249 Contracture of muscle, multiple sites: Secondary | ICD-10-CM

## 2013-11-15 DIAGNOSIS — M62838 Other muscle spasm: Secondary | ICD-10-CM | POA: Insufficient documentation

## 2013-11-15 DIAGNOSIS — G43709 Chronic migraine without aura, not intractable, without status migrainosus: Secondary | ICD-10-CM

## 2013-11-15 MED ORDER — MUPIROCIN 2 % EX OINT: TOPICAL_OINTMENT | CUTANEOUS | Status: DC

## 2013-11-15 MED ORDER — ONDANSETRON 8 MG PO TBDP: 8.0000 mg | ORAL_TABLET | Freq: Three times a day (TID) | ORAL | Status: DC | PRN

## 2013-11-15 MED ORDER — CLOTRIMAZOLE-BETAMETHASONE 1-0.05 % EX CREA: 1.0000 "application " | TOPICAL_CREAM | Freq: Two times a day (BID) | CUTANEOUS | Status: DC

## 2013-11-15 MED ORDER — BUTALBITAL-APAP-CAFFEINE 50-325-40 MG PO TABS: 1.0000 | ORAL_TABLET | Freq: Four times a day (QID) | ORAL | Status: DC | PRN

## 2013-11-15 NOTE — Assessment & Plan Note (Signed)
Adding Fioricet.

## 2013-11-15 NOTE — Assessment & Plan Note (Signed)
Resolved with antibiotics. Continue as needed Keflex, and antifungal. As needed Lotrisone, refilling Bactroban.

## 2013-11-15 NOTE — Progress Notes (Signed)
  Subjective:    CC: Follow-up  HPI: Mastitis: Resolved. She would like to keep a course of antibiotics and topical antifungals on hand in case this recurs.  Right-sided neck pain: Moderate, persistent with occasional radiation down the right arm, overall improving.  Migraines: Wonders what else can be used, she did have a single migraine and typically gets them once a month. She doesn't want to do any prophylactic medicine for now. She was recently seen in urgent care.  Past medical history, Surgical history, Family history not pertinant except as noted below, Social history, Allergies, and medications have been entered into the medical record, reviewed, and no changes needed.   Review of Systems: No fevers, chills, night sweats, weight loss, chest pain, or shortness of breath.   Objective:    General: Well Developed, well nourished, and in no acute distress.  Neuro: Alert and oriented x3, extra-ocular muscles intact, sensation grossly intact.  HEENT: Normocephalic, atraumatic, pupils equal round reactive to light, neck supple, no masses, no lymphadenopathy, thyroid nonpalpable.  Skin: Warm and dry, no rashes. Cardiac: Regular rate and rhythm, no murmurs rubs or gallops, no lower extremity edema.  Respiratory: Clear to auscultation bilaterally. Not using accessory muscles, speaking in full sentences.  Impression and Recommendations:

## 2013-11-15 NOTE — Assessment & Plan Note (Signed)
Rehabilitation exercises given. X-rays. There were right-sided radicular symptoms.

## 2013-11-18 ENCOUNTER — Encounter: Payer: Self-pay | Admitting: Sports Medicine

## 2013-12-09 ENCOUNTER — Ambulatory Visit (INDEPENDENT_AMBULATORY_CARE_PROVIDER_SITE_OTHER): Payer: 59 | Admitting: Family Medicine

## 2013-12-09 ENCOUNTER — Encounter: Payer: Self-pay | Admitting: Family Medicine

## 2013-12-09 VITALS — BP 114/75 | HR 69 | Wt 202.0 lb

## 2013-12-09 DIAGNOSIS — G43809 Other migraine, not intractable, without status migrainosus: Secondary | ICD-10-CM

## 2013-12-09 MED ORDER — METHYLPREDNISOLONE ACETATE 80 MG/ML IJ SUSP
80.0000 mg | Freq: Once | INTRAMUSCULAR | Status: AC
  Administered 2013-12-09: 80 mg via INTRAMUSCULAR

## 2013-12-09 MED ORDER — KETOROLAC TROMETHAMINE 60 MG/2ML IM SOLN
60.0000 mg | Freq: Once | INTRAMUSCULAR | Status: AC
  Administered 2013-12-09: 60 mg via INTRAMUSCULAR

## 2013-12-09 NOTE — Patient Instructions (Signed)
There is some evidence that OTC Magnesium 250mg  daily will help prevent headaches and migraines.   (Brand: [place] or Nature Made)

## 2013-12-09 NOTE — Progress Notes (Signed)
CC: Michelle Moody is a 28 y.o. female is here for Migraine   Subjective: HPI:  Complains of a right-sided pounding headache localized behind the right eye that radiates into the right temple that has been present since Saturday this weekend. It has been fluctuating around a moderate degree of severity. It is worse around bright lights. She denies any phonophobia. Accompanied by nausea. Slightly improved for only hours at a time with Fioricet, no other interventions other than avoiding bright lights and trying to sleep. She states that this headache is identical to all other migraines that she has had in her life. She denies any change in the character or severity of this headache compared to  migraines in the past. She denies any recent or remote motor or sensory disturbances. She denies fevers, chills, nor positional component to her headache. No neck pain.   Review Of Systems Outlined In HPI  Past Medical History  Diagnosis Date  . Hyperlipidemia   . Migraines   . History of dental surgery     2007  . Gallstones     02/2012  . Anxiety     2011  . Obese     Past Surgical History  Procedure Laterality Date  . Dental surgery     Family History  Problem Relation Age of Onset  . Cancer Father   . Hyperlipidemia Father   . Hypertension Father   . Cancer Maternal Grandmother   . Alcohol abuse Maternal Grandfather   . Stroke Paternal Grandmother   . Heart disease Paternal Grandfather   . Hyperlipidemia Paternal Grandfather   . Hypertension Paternal Grandfather   . Thyroid disease Mother     History   Social History  . Marital Status: Married    Spouse Name: N/A    Number of Children: N/A  . Years of Education: N/A   Occupational History  . Not on file.   Social History Main Topics  . Smoking status: Never Smoker   . Smokeless tobacco: Not on file  . Alcohol Use: No  . Drug Use: No  . Sexual Activity: Yes    Birth Control/ Protection: Condom   Other Topics Concern   . Not on file   Social History Narrative     Objective: BP 114/75 mmHg  Pulse 69  Wt 202 lb (91.627 kg)  LMP 09/28/2013  Vital signs reviewed. General: Alert and Oriented, No Acute Distress HEENT: Pupils equal, round, reactive to light. Conjunctivae clear.  External ears unremarkable.  Moist mucous membranes. Lungs: Clear and comfortable work of breathing, speaking in full sentences without accessory muscle use. Cardiac: Regular rate and rhythm.  Neuro: CN II-XII grossly intact, gait normal. Extremities: No peripheral edema.  Strong peripheral pulses.  Mental Status: No depression, anxiety, nor agitation. Logical though process. Skin: Warm and dry.  Assessment & Plan: Michelle Moody was seen today for migraine.  Diagnoses and associated orders for this visit:  Other type of migraine - methylPREDNISolone acetate (DEPO-MEDROL) injection 80 mg; Inject 1 mL (80 mg total) into the muscle once. - ketorolac (TORADOL) injection 60 mg; Inject 2 mLs (60 mg total) into the muscle once.    Migraine: Treatment today with methylprednisone and Toradol. Discussed using magnesium 250 mg daily to help prevent future migraines and that most prescription based prophylactic medications have some degree of risk with feeding.Signs and symptoms requring emergent/urgent reevaluation were discussed with the patient.  Return if symptoms worsen or fail to improve.

## 2013-12-10 ENCOUNTER — Emergency Department (HOSPITAL_COMMUNITY)
Admission: EM | Admit: 2013-12-10 | Discharge: 2013-12-10 | Disposition: A | Discharge status: Home / Self Care | Payer: 59 | Source: Home / Self Care | Attending: Family Medicine | Admitting: Family Medicine

## 2013-12-10 ENCOUNTER — Encounter (HOSPITAL_COMMUNITY): Payer: Self-pay | Admitting: *Deleted

## 2013-12-10 DIAGNOSIS — G43819 Other migraine, intractable, without status migrainosus: Secondary | ICD-10-CM

## 2013-12-10 DIAGNOSIS — Z789 Other specified health status: Secondary | ICD-10-CM

## 2013-12-10 MED ORDER — KETOROLAC TROMETHAMINE 60 MG/2ML IM SOLN
60.0000 mg | Freq: Once | INTRAMUSCULAR | Status: AC
Start: 2013-12-10 — End: 2013-12-10
  Administered 2013-12-10: 60 mg via INTRAMUSCULAR

## 2013-12-10 MED ORDER — KETOROLAC TROMETHAMINE 60 MG/2ML IM SOLN
INTRAMUSCULAR | Status: AC
  Filled 2013-12-10: qty 2

## 2013-12-10 NOTE — Discharge Instructions (Signed)
Thank you for coming in today. Go home and take a phenergan.  Come back as needed.  Do not breastfeed for 6 hours.  Consider following up with a neurologist.    Migraine Headache A migraine headache is an intense, throbbing pain on one or both sides of your head. A migraine can last for 30 minutes to several hours. CAUSES  The exact cause of a migraine headache is not always known. However, a migraine may be caused when nerves in the brain become irritated and release chemicals that cause inflammation. This causes pain. Certain things may also trigger migraines, such as:  Alcohol.  Smoking.  Stress.  Menstruation.  Aged cheeses.  Foods or drinks that contain nitrates, glutamate, aspartame, or tyramine.  Lack of sleep.  Chocolate.  Caffeine.  Hunger.  Physical exertion.  Fatigue.  Medicines used to treat chest pain (nitroglycerine), birth control pills, estrogen, and some blood pressure medicines. SIGNS AND SYMPTOMS  Pain on one or both sides of your head.  Pulsating or throbbing pain.  Severe pain that prevents daily activities.  Pain that is aggravated by any physical activity.  Nausea, vomiting, or both.  Dizziness.  Pain with exposure to bright lights, loud noises, or activity.  General sensitivity to bright lights, loud noises, or smells. Before you get a migraine, you may get warning signs that a migraine is coming (aura). An aura may include:  Seeing flashing lights.  Seeing bright spots, halos, or zigzag lines.  Having tunnel vision or blurred vision.  Having feelings of numbness or tingling.  Having trouble talking.  Having muscle weakness. DIAGNOSIS  A migraine headache is often diagnosed based on:  Symptoms.  Physical exam.  A CT scan or MRI of your head. These imaging tests cannot diagnose migraines, but they can help rule out other causes of headaches. TREATMENT Medicines may be given for pain and nausea. Medicines can also be  given to help prevent recurrent migraines.  HOME CARE INSTRUCTIONS  Only take over-the-counter or prescription medicines for pain or discomfort as directed by your health care provider. The use of long-term narcotics is not recommended.  Lie down in a dark, quiet room when you have a migraine.  Keep a journal to find out what may trigger your migraine headaches. For example, write down:  What you eat and drink.  How much sleep you get.  Any change to your diet or medicines.  Limit alcohol consumption.  Quit smoking if you smoke.  Get 7-9 hours of sleep, or as recommended by your health care provider.  Limit stress.  Keep lights dim if bright lights bother you and make your migraines worse. SEEK IMMEDIATE MEDICAL CARE IF:   Your migraine becomes severe.  You have a fever.  You have a stiff neck.  You have vision loss.  You have muscular weakness or loss of muscle control.  You start losing your balance or have trouble walking.  You feel faint or pass out.  You have severe symptoms that are different from your first symptoms. MAKE SURE YOU:   Understand these instructions.  Will watch your condition.  Will get help right away if you are not doing well or get worse. Document Released: 01/03/2005 Document Revised: 05/20/2013 Document Reviewed: 09/10/2012 [facility] Patient Information 2015 [place], [place]. This information is not intended to replace advice given to you by your health care provider. Make sure you discuss any questions you have with your health care provider.

## 2013-12-10 NOTE — ED Notes (Signed)
3rd day of a migraine. Typical for her.  She tried fioricet without relief   She took toradol yesterday, with some relief, but the pain returned

## 2013-12-10 NOTE — ED Provider Notes (Signed)
Michelle Moody is a 28 y.o. female who presents to Urgent Care today for weight. Patient has a several day history of headache. Headache is consistent with previous episodes of migraine. She was seen yesterday at her primary care office were worse she received IM injection of Toradol and Depo-Medrol. This helped temporarily. She denies any weakness or numbness.   Past Medical History  Diagnosis Date  . Hyperlipidemia   . Migraines   . History of dental surgery     2007  . Gallstones     02/2012  . Anxiety     2011  . Obese    Past Surgical History  Procedure Laterality Date  . Dental surgery     History  Substance Use Topics  . Smoking status: Never Smoker   . Smokeless tobacco: Not on file  . Alcohol Use: No   ROS as above Medications: No current facility-administered medications for this encounter.   Current Outpatient Prescriptions  Medication Sig Dispense Refill  . clotrimazole-betamethasone (LOTRISONE) cream Apply 1 application topically 2 (two) times daily. 45 g 0  . diclofenac (VOLTAREN) 75 MG EC tablet Take 1 tablet (75 mg total) by mouth 2 (two) times daily. 60 tablet 3  . ferrous fumarate (HEMOCYTE - 106 MG FE) 325 (106 FE) MG TABS tablet Take 1 tablet by mouth.    Marland Kitchen GARLIC PO Take by mouth.    . Grapefruit Seed 60 % EXTR 250 mg by Does not apply route daily.    . mupirocin ointment (BACTROBAN) 2 % Apply to affected area TID for 7 days. 30 g 3  . ondansetron (ZOFRAN-ODT) 8 MG disintegrating tablet Take 1 tablet (8 mg total) by mouth every 8 (eight) hours as needed for nausea. 20 tablet 3  . Prenatal Vit-Fe Fumarate-FA (PRENATAL MULTIVITAMIN) TABS tablet Take 1 tablet by mouth daily at 12 noon.    . promethazine (PHENERGAN) 25 MG tablet Take 1 tablet (25 mg total) by mouth every 6 (six) hours as needed for nausea or vomiting. 20 tablet 0  . butalbital-acetaminophen-caffeine (FIORICET) 50-325-40 MG per tablet Take 1-2 tablets by mouth every 6 (six) hours as needed for  headache. 20 tablet 0  . [DISCONTINUED] ferrous sulfate 325 (65 FE) MG tablet Take 1 tablet (325 mg total) by mouth 2 (two) times daily with a meal. 60 tablet 1   Allergies  Allergen Reactions  . Clindamycin/Lincomycin Nausea And Vomiting  . Latex Rash     Exam:  BP 121/82 mmHg  Pulse 63  Temp(Src) 97.8 F (36.6 C) (Oral)  Resp 14  SpO2 100%  LMP 10/28/2013  Breastfeeding? Yes Gen: Well NAD HEENT: EOMI,  MMM PERRLA Lungs: Normal work of breathing. CTABL Heart: RRR no MRG Abd: NABS, Soft. Nondistended, Nontender Exts: Brisk capillary refill, warm and well perfused.  Neuro: Coordination and balance  No results found for this or any previous visit (from the past 24 hour(s)). No results found.  Assessment and Plan: 28 y.o. female with migraine headache. Patient was given it 60 mg of intramuscular Toradol. Abstain for breast-feeding for several hours. Go home and treat with Phenergan. Follow-up with PCP.  Discussed warning signs or symptoms. Please see discharge instructions. Patient expresses understanding.     Gregor Hams, MD 12/10/13 1314

## 2013-12-20 ENCOUNTER — Encounter: Payer: Self-pay | Admitting: Family Medicine

## 2013-12-20 ENCOUNTER — Ambulatory Visit (INDEPENDENT_AMBULATORY_CARE_PROVIDER_SITE_OTHER): Payer: 59 | Admitting: Family Medicine

## 2013-12-20 VITALS — BP 115/67 | HR 92 | Temp 98.5°F | Ht 64.0 in | Wt 200.6 lb

## 2013-12-20 DIAGNOSIS — D649 Anemia, unspecified: Secondary | ICD-10-CM

## 2013-12-20 DIAGNOSIS — E785 Hyperlipidemia, unspecified: Secondary | ICD-10-CM

## 2013-12-20 DIAGNOSIS — Z789 Other specified health status: Secondary | ICD-10-CM

## 2013-12-20 DIAGNOSIS — G43009 Migraine without aura, not intractable, without status migrainosus: Secondary | ICD-10-CM

## 2013-12-20 DIAGNOSIS — Z0000 Encounter for general adult medical examination without abnormal findings: Secondary | ICD-10-CM

## 2013-12-20 DIAGNOSIS — E669 Obesity, unspecified: Secondary | ICD-10-CM

## 2013-12-20 MED ORDER — PROPRANOLOL HCL 10 MG PO TABS: 10.0000 mg | ORAL_TABLET | Freq: Two times a day (BID) | ORAL | Status: DC

## 2013-12-20 NOTE — Progress Notes (Signed)
Pre visit review using our clinic review tool, if applicable. No additional management support is needed unless otherwise documented below in the visit note. 

## 2013-12-20 NOTE — Patient Instructions (Signed)
Add Ginger caps 3 x a day.   Consider a probiotic Digestive Advantage or [employer] daily.   Preventive Care for Adults A healthy lifestyle and preventive care can promote health and wellness. Preventive health guidelines for women include the following key practices.  A routine yearly physical is a good way to check with your health care provider about your health and preventive screening. It is a chance to share any concerns and updates on your health and to receive a thorough exam.  Visit your dentist for a routine exam and preventive care every 6 months. Brush your teeth twice a day and floss once a day. Good oral hygiene prevents tooth decay and gum disease.  The frequency of eye exams is based on your age, health, family medical history, use of contact lenses, and other factors. Follow your health care provider's recommendations for frequency of eye exams.  Eat a healthy diet. Foods like vegetables, fruits, whole grains, low-fat dairy products, and lean protein foods contain the nutrients you need without too many calories. Decrease your intake of foods high in solid fats, added sugars, and salt. Eat the right amount of calories for you.Get information about a proper diet from your health care provider, if necessary.  Regular physical exercise is one of the most important things you can do for your health. Most adults should get at least 150 minutes of moderate-intensity exercise (any activity that increases your heart rate and causes you to sweat) each week. In addition, most adults need muscle-strengthening exercises on 2 or more days a week.  Maintain a healthy weight. The body mass index (BMI) is a screening tool to identify possible weight problems. It provides an estimate of body fat based on height and weight. Your health care provider can find your BMI and can help you achieve or maintain a healthy weight.For adults 20 years and older:  A BMI below 18.5 is considered  underweight.  A BMI of 18.5 to 24.9 is normal.  A BMI of 25 to 29.9 is considered overweight.  A BMI of 30 and above is considered obese.  Maintain normal blood lipids and cholesterol levels by exercising and minimizing your intake of saturated fat. Eat a balanced diet with plenty of fruit and vegetables. Blood tests for lipids and cholesterol should begin at age 30 and be repeated every 5 years. If your lipid or cholesterol levels are high, you are over 50, or you are at high risk for heart disease, you may need your cholesterol levels checked more frequently.Ongoing high lipid and cholesterol levels should be treated with medicines if diet and exercise are not working.  If you smoke, find out from your health care provider how to quit. If you do not use tobacco, do not start.  Lung cancer screening is recommended for adults aged 82-80 years who are at high risk for developing lung cancer because of a history of smoking. A yearly low-dose CT scan of the lungs is recommended for people who have at least a 30-pack-year history of smoking and are a current smoker or have quit within the past 15 years. A pack year of smoking is smoking an average of 1 pack of cigarettes a day for 1 year (for example: 1 pack a day for 30 years or 2 packs a day for 15 years). Yearly screening should continue until the smoker has stopped smoking for at least 15 years. Yearly screening should be stopped for people who develop a health problem that  would prevent them from having lung cancer treatment.  If you are pregnant, do not drink alcohol. If you are breastfeeding, be very cautious about drinking alcohol. If you are not pregnant and choose to drink alcohol, do not have more than 1 drink per day. One drink is considered to be 12 ounces (355 mL) of beer, 5 ounces (148 mL) of wine, or 1.5 ounces (44 mL) of liquor.  Avoid use of street drugs. Do not share needles with anyone. Ask for help if you need support or  instructions about stopping the use of drugs.  High blood pressure causes heart disease and increases the risk of stroke. Your blood pressure should be checked at least every 1 to 2 years. Ongoing high blood pressure should be treated with medicines if weight loss and exercise do not work.  If you are 86-59 years old, ask your health care provider if you should take aspirin to prevent strokes.  Diabetes screening involves taking a blood sample to check your fasting blood sugar level. This should be done once every 3 years, after age 26, if you are within normal weight and without risk factors for diabetes. Testing should be considered at a younger age or be carried out more frequently if you are overweight and have at least 1 risk factor for diabetes.  Breast cancer screening is essential preventive care for women. You should practice "breast self-awareness." This means understanding the normal appearance and feel of your breasts and may include breast self-examination. Any changes detected, no matter how small, should be reported to a health care provider. Women in their 78s and 30s should have a clinical breast exam (CBE) by a health care provider as part of a regular health exam every 1 to 3 years. After age 12, women should have a CBE every year. Starting at age 46, women should consider having a mammogram (breast X-ray test) every year. Women who have a family history of breast cancer should talk to their health care provider about genetic screening. Women at a high risk of breast cancer should talk to their health care providers about having an MRI and a mammogram every year.  Breast cancer gene (BRCA)-related cancer risk assessment is recommended for women who have family members with BRCA-related cancers. BRCA-related cancers include breast, ovarian, tubal, and peritoneal cancers. Having family members with these cancers may be associated with an increased risk for harmful changes (mutations) in  the breast cancer genes BRCA1 and BRCA2. Results of the assessment will determine the need for genetic counseling and BRCA1 and BRCA2 testing.  Routine pelvic exams to screen for cancer are no longer recommended for nonpregnant women who are considered low risk for cancer of the pelvic organs (ovaries, uterus, and vagina) and who do not have symptoms. Ask your health care provider if a screening pelvic exam is right for you.  If you have had past treatment for cervical cancer or a condition that could lead to cancer, you need Pap tests and screening for cancer for at least 20 years after your treatment. If Pap tests have been discontinued, your risk factors (such as having a new sexual partner) need to be reassessed to determine if screening should be resumed. Some women have medical problems that increase the chance of getting cervical cancer. In these cases, your health care provider may recommend more frequent screening and Pap tests.  The HPV test is an additional test that may be used for cervical cancer screening. The HPV test looks  for the virus that can cause the cell changes on the cervix. The cells collected during the Pap test can be tested for HPV. The HPV test could be used to screen women aged 31 years and older, and should be used in women of any age who have unclear Pap test results. After the age of 26, women should have HPV testing at the same frequency as a Pap test.  Colorectal cancer can be detected and often prevented. Most routine colorectal cancer screening begins at the age of 29 years and continues through age 59 years. However, your health care provider may recommend screening at an earlier age if you have risk factors for colon cancer. On a yearly basis, your health care provider may provide home test kits to check for hidden blood in the stool. Use of a small camera at the end of a tube, to directly examine the colon (sigmoidoscopy or colonoscopy), can detect the earliest forms  of colorectal cancer. Talk to your health care provider about this at age 66, when routine screening begins. Direct exam of the colon should be repeated every 5-10 years through age 89 years, unless early forms of pre-cancerous polyps or small growths are found.  People who are at an increased risk for hepatitis B should be screened for this virus. You are considered at high risk for hepatitis B if:  You were born in a country where hepatitis B occurs often. Talk with your health care provider about which countries are considered high risk.  Your parents were born in a high-risk country and you have not received a shot to protect against hepatitis B (hepatitis B vaccine).  You have HIV or AIDS.  You use needles to inject street drugs.  You live with, or have sex with, someone who has hepatitis B.  You get hemodialysis treatment.  You take certain medicines for conditions like cancer, organ transplantation, and autoimmune conditions.  Hepatitis C blood testing is recommended for all people born from [birth year] through 1965 and any individual with known risks for hepatitis C.  Practice safe sex. Use condoms and avoid high-risk sexual practices to reduce the spread of sexually transmitted infections (STIs). STIs include gonorrhea, chlamydia, syphilis, trichomonas, herpes, HPV, and human immunodeficiency virus (HIV). Herpes, HIV, and HPV are viral illnesses that have no cure. They can result in disability, cancer, and death.  You should be screened for sexually transmitted illnesses (STIs) including gonorrhea and chlamydia if:  You are sexually active and are younger than 24 years.  You are older than 24 years and your health care provider tells you that you are at risk for this type of infection.  Your sexual activity has changed since you were last screened and you are at an increased risk for chlamydia or gonorrhea. Ask your health care provider if you are at risk.  If you are at risk of  being infected with HIV, it is recommended that you take a prescription medicine daily to prevent HIV infection. This is called preexposure prophylaxis (PrEP). You are considered at risk if:  You are a heterosexual woman, are sexually active, and are at increased risk for HIV infection.  You take drugs by injection.  You are sexually active with a partner who has HIV.  Talk with your health care provider about whether you are at high risk of being infected with HIV. If you choose to begin PrEP, you should first be tested for HIV. You should then be tested every 3 months  for as long as you are taking PrEP.  Osteoporosis is a disease in which the bones lose minerals and strength with aging. This can result in serious bone fractures or breaks. The risk of osteoporosis can be identified using a bone density scan. Women ages 57 years and over and women at risk for fractures or osteoporosis should discuss screening with their health care providers. Ask your health care provider whether you should take a calcium supplement or vitamin D to reduce the rate of osteoporosis.  Menopause can be associated with physical symptoms and risks. Hormone replacement therapy is available to decrease symptoms and risks. You should talk to your health care provider about whether hormone replacement therapy is right for you.  Use sunscreen. Apply sunscreen liberally and repeatedly throughout the day. You should seek shade when your shadow is shorter than you. Protect yourself by wearing long sleeves, pants, a wide-brimmed hat, and sunglasses year round, whenever you are outdoors.  Once a month, do a whole body skin exam, using a mirror to look at the skin on your back. Tell your health care provider of new moles, moles that have irregular borders, moles that are larger than a pencil eraser, or moles that have changed in shape or color.  Stay current with required vaccines (immunizations).  Influenza vaccine. All adults  should be immunized every year.  Tetanus, diphtheria, and acellular pertussis (Td, Tdap) vaccine. Pregnant women should receive 1 dose of Tdap vaccine during each pregnancy. The dose should be obtained regardless of the length of time since the last dose. Immunization is preferred during the 27th-36th week of gestation. An adult who has not previously received Tdap or who does not know her vaccine status should receive 1 dose of Tdap. This initial dose should be followed by tetanus and diphtheria toxoids (Td) booster doses every 10 years. Adults with an unknown or incomplete history of completing a 3-dose immunization series with Td-containing vaccines should begin or complete a primary immunization series including a Tdap dose. Adults should receive a Td booster every 10 years.  Varicella vaccine. An adult without evidence of immunity to varicella should receive 2 doses or a second dose if she has previously received 1 dose. Pregnant females who do not have evidence of immunity should receive the first dose after pregnancy. This first dose should be obtained before leaving the health care facility. The second dose should be obtained 4-8 weeks after the first dose.  Human papillomavirus (HPV) vaccine. Females aged 13-26 years who have not received the vaccine previously should obtain the 3-dose series. The vaccine is not recommended for use in pregnant females. However, pregnancy testing is not needed before receiving a dose. If a female is found to be pregnant after receiving a dose, no treatment is needed. In that case, the remaining doses should be delayed until after the pregnancy. Immunization is recommended for any person with an immunocompromised condition through the age of 43 years if she did not get any or all doses earlier. During the 3-dose series, the second dose should be obtained 4-8 weeks after the first dose. The third dose should be obtained 24 weeks after the first dose and 16 weeks after  the second dose.  Zoster vaccine. One dose is recommended for adults aged 4 years or older unless certain conditions are present.  Measles, mumps, and rubella (MMR) vaccine. Adults born before [birth year] generally are considered immune to measles and mumps. Adults born in [birth year] or later should have 1 or  more doses of MMR vaccine unless there is a contraindication to the vaccine or there is laboratory evidence of immunity to each of the three diseases. A routine second dose of MMR vaccine should be obtained at least 28 days after the first dose for students attending postsecondary schools, health care workers, or international travelers. People who received inactivated measles vaccine or an unknown type of measles vaccine during 1963-1967 should receive 2 doses of MMR vaccine. People who received inactivated mumps vaccine or an unknown type of mumps vaccine before 1979 and are at high risk for mumps infection should consider immunization with 2 doses of MMR vaccine. For females of childbearing age, rubella immunity should be determined. If there is no evidence of immunity, females who are not pregnant should be vaccinated. If there is no evidence of immunity, females who are pregnant should delay immunization until after pregnancy. Unvaccinated health care workers born before [birth year] who lack laboratory evidence of measles, mumps, or rubella immunity or laboratory confirmation of disease should consider measles and mumps immunization with 2 doses of MMR vaccine or rubella immunization with 1 dose of MMR vaccine.  Pneumococcal 13-valent conjugate (PCV13) vaccine. When indicated, a person who is uncertain of her immunization history and has no record of immunization should receive the PCV13 vaccine. An adult aged 36 years or older who has certain medical conditions and has not been previously immunized should receive 1 dose of PCV13 vaccine. This PCV13 should be followed with a dose of pneumococcal polysaccharide (PPSV23)  vaccine. The PPSV23 vaccine dose should be obtained at least 8 weeks after the dose of PCV13 vaccine. An adult aged 51 years or older who has certain medical conditions and previously received 1 or more doses of PPSV23 vaccine should receive 1 dose of PCV13. The PCV13 vaccine dose should be obtained 1 or more years after the last PPSV23 vaccine dose.  Pneumococcal polysaccharide (PPSV23) vaccine. When PCV13 is also indicated, PCV13 should be obtained first. All adults aged 28 years and older should be immunized. An adult younger than age 6 years who has certain medical conditions should be immunized. Any person who resides in a nursing home or long-term care facility should be immunized. An adult smoker should be immunized. People with an immunocompromised condition and certain other conditions should receive both PCV13 and PPSV23 vaccines. People with human immunodeficiency virus (HIV) infection should be immunized as soon as possible after diagnosis. Immunization during chemotherapy or radiation therapy should be avoided. Routine use of PPSV23 vaccine is not recommended for American Indians, [place] Natives, or people younger than 65 years unless there are medical conditions that require PPSV23 vaccine. When indicated, people who have unknown immunization and have no record of immunization should receive PPSV23 vaccine. One-time revaccination 5 years after the first dose of PPSV23 is recommended for people aged 19-64 years who have chronic kidney failure, nephrotic syndrome, asplenia, or immunocompromised conditions. People who received 1-2 doses of PPSV23 before age 27 years should receive another dose of PPSV23 vaccine at age 4 years or later if at least 5 years have passed since the previous dose. Doses of PPSV23 are not needed for people immunized with PPSV23 at or after age 96 years.  Meningococcal vaccine. Adults with asplenia or persistent complement component deficiencies should receive 2 doses of  quadrivalent meningococcal conjugate (MenACWY-D) vaccine. The doses should be obtained at least 2 months apart. Microbiologists working with certain meningococcal bacteria, [employer] recruits, people at risk during an outbreak, and people who travel  to or live in countries with a high rate of meningitis should be immunized. A first-year college student up through age 48 years who is living in a residence hall should receive a dose if she did not receive a dose on or after her 16th birthday. Adults who have certain high-risk conditions should receive one or more doses of vaccine.  Hepatitis A vaccine. Adults who wish to be protected from this disease, have certain high-risk conditions, work with hepatitis A-infected animals, work in hepatitis A research labs, or travel to or work in countries with a high rate of hepatitis A should be immunized. Adults who were previously unvaccinated and who anticipate close contact with an international adoptee during the first 60 days after arrival in the [place] from a country with a high rate of hepatitis A should be immunized.  Hepatitis B vaccine. Adults who wish to be protected from this disease, have certain high-risk conditions, may be exposed to blood or other infectious body fluids, are household contacts or sex partners of hepatitis B positive people, are clients or workers in certain care facilities, or travel to or work in countries with a high rate of hepatitis B should be immunized.  Haemophilus influenzae type b (Hib) vaccine. A previously unvaccinated person with asplenia or sickle cell disease or having a scheduled splenectomy should receive 1 dose of Hib vaccine. Regardless of previous immunization, a recipient of a hematopoietic stem cell transplant should receive a 3-dose series 6-12 months after her successful transplant. Hib vaccine is not recommended for adults with HIV infection. Preventive Services / Frequency Ages 30 to 73 years  Blood  pressure check.** / Every 1 to 2 years.  Lipid and cholesterol check.** / Every 5 years beginning at age 5.  Clinical breast exam.** / Every 3 years for women in their 31s and 85s.  BRCA-related cancer risk assessment.** / For women who have family members with a BRCA-related cancer (breast, ovarian, tubal, or peritoneal cancers).  Pap test.** / Every 2 years from ages 107 through 68. Every 3 years starting at age 47 through age 64 or 76 with a history of 3 consecutive normal Pap tests.  HPV screening.** / Every 3 years from ages 21 through ages 20 to 25 with a history of 3 consecutive normal Pap tests.  Hepatitis C blood test.** / For any individual with known risks for hepatitis C.  Skin self-exam. / Monthly.  Influenza vaccine. / Every year.  Tetanus, diphtheria, and acellular pertussis (Tdap, Td) vaccine.** / Consult your health care provider. Pregnant women should receive 1 dose of Tdap vaccine during each pregnancy. 1 dose of Td every 10 years.  Varicella vaccine.** / Consult your health care provider. Pregnant females who do not have evidence of immunity should receive the first dose after pregnancy.  HPV vaccine. / 3 doses over 6 months, if 6 and younger. The vaccine is not recommended for use in pregnant females. However, pregnancy testing is not needed before receiving a dose.  Measles, mumps, rubella (MMR) vaccine.** / You need at least 1 dose of MMR if you were born in 1957 or later. You may also need a 2nd dose. For females of childbearing age, rubella immunity should be determined. If there is no evidence of immunity, females who are not pregnant should be vaccinated. If there is no evidence of immunity, females who are pregnant should delay immunization until after pregnancy.  Pneumococcal 13-valent conjugate (PCV13) vaccine.** / Consult your health care provider.  Pneumococcal  polysaccharide (PPSV23) vaccine.** / 1 to 2 doses if you smoke cigarettes or if you have certain  conditions.  Meningococcal vaccine.** / 1 dose if you are age 55 to 37 years and a Market researcher living in a residence hall, or have one of several medical conditions, you need to get vaccinated against meningococcal disease. You may also need additional booster doses.  Hepatitis A vaccine.** / Consult your health care provider.  Hepatitis B vaccine.** / Consult your health care provider.  Haemophilus influenzae type b (Hib) vaccine.** / Consult your health care provider. Ages 45 to 45 years  Blood pressure check.** / Every 1 to 2 years.  Lipid and cholesterol check.** / Every 5 years beginning at age 66 years.  Lung cancer screening. / Every year if you are aged 44-80 years and have a 30-pack-year history of smoking and currently smoke or have quit within the past 15 years. Yearly screening is stopped once you have quit smoking for at least 15 years or develop a health problem that would prevent you from having lung cancer treatment.  Clinical breast exam.** / Every year after age 57 years.  BRCA-related cancer risk assessment.** / For women who have family members with a BRCA-related cancer (breast, ovarian, tubal, or peritoneal cancers).  Mammogram.** / Every year beginning at age 4 years and continuing for as long as you are in good health. Consult with your health care provider.  Pap test.** / Every 3 years starting at age 17 years through age 17 or 4 years with a history of 3 consecutive normal Pap tests.  HPV screening.** / Every 3 years from ages 7 years through ages 54 to 58 years with a history of 3 consecutive normal Pap tests.  Fecal occult blood test (FOBT) of stool. / Every year beginning at age 73 years and continuing until age 17 years. You may not need to do this test if you get a colonoscopy every 10 years.  Flexible sigmoidoscopy or colonoscopy.** / Every 5 years for a flexible sigmoidoscopy or every 10 years for a colonoscopy beginning at age 16 years  and continuing until age 9 years.  Hepatitis C blood test.** / For all people born from [birth year] through 1966 and any individual with known risks for hepatitis C.  Skin self-exam. / Monthly.  Influenza vaccine. / Every year.  Tetanus, diphtheria, and acellular pertussis (Tdap/Td) vaccine.** / Consult your health care provider. Pregnant women should receive 1 dose of Tdap vaccine during each pregnancy. 1 dose of Td every 10 years.  Varicella vaccine.** / Consult your health care provider. Pregnant females who do not have evidence of immunity should receive the first dose after pregnancy.  Zoster vaccine.** / 1 dose for adults aged 6 years or older.  Measles, mumps, rubella (MMR) vaccine.** / You need at least 1 dose of MMR if you were born in 1957 or later. You may also need a 2nd dose. For females of childbearing age, rubella immunity should be determined. If there is no evidence of immunity, females who are not pregnant should be vaccinated. If there is no evidence of immunity, females who are pregnant should delay immunization until after pregnancy.  Pneumococcal 13-valent conjugate (PCV13) vaccine.** / Consult your health care provider.  Pneumococcal polysaccharide (PPSV23) vaccine.** / 1 to 2 doses if you smoke cigarettes or if you have certain conditions.  Meningococcal vaccine.** / Consult your health care provider.  Hepatitis A vaccine.** / Consult your health care provider.  Hepatitis B  vaccine.** / Consult your health care provider.  Haemophilus influenzae type b (Hib) vaccine.** / Consult your health care provider. Ages 81 years and over  Blood pressure check.** / Every 1 to 2 years.  Lipid and cholesterol check.** / Every 5 years beginning at age 27 years.  Lung cancer screening. / Every year if you are aged 23-80 years and have a 30-pack-year history of smoking and currently smoke or have quit within the past 15 years. Yearly screening is stopped once you have quit smoking  for at least 15 years or develop a health problem that would prevent you from having lung cancer treatment.  Clinical breast exam.** / Every year after age 64 years.  BRCA-related cancer risk assessment.** / For women who have family members with a BRCA-related cancer (breast, ovarian, tubal, or peritoneal cancers).  Mammogram.** / Every year beginning at age 58 years and continuing for as long as you are in good health. Consult with your health care provider.  Pap test.** / Every 3 years starting at age 97 years through age 81 or 8 years with 3 consecutive normal Pap tests. Testing can be stopped between 65 and 70 years with 3 consecutive normal Pap tests and no abnormal Pap or HPV tests in the past 10 years.  HPV screening.** / Every 3 years from ages 90 years through ages 93 or 59 years with a history of 3 consecutive normal Pap tests. Testing can be stopped between 65 and 70 years with 3 consecutive normal Pap tests and no abnormal Pap or HPV tests in the past 10 years.  Fecal occult blood test (FOBT) of stool. / Every year beginning at age 73 years and continuing until age 68 years. You may not need to do this test if you get a colonoscopy every 10 years.  Flexible sigmoidoscopy or colonoscopy.** / Every 5 years for a flexible sigmoidoscopy or every 10 years for a colonoscopy beginning at age 37 years and continuing until age 15 years.  Hepatitis C blood test.** / For all people born from [birth year] through 1966 and any individual with known risks for hepatitis C.  Osteoporosis screening.** / A one-time screening for women ages 64 years and over and women at risk for fractures or osteoporosis.  Skin self-exam. / Monthly.  Influenza vaccine. / Every year.  Tetanus, diphtheria, and acellular pertussis (Tdap/Td) vaccine.** / 1 dose of Td every 10 years.  Varicella vaccine.** / Consult your health care provider.  Zoster vaccine.** / 1 dose for adults aged 71 years or older.  Pneumococcal  13-valent conjugate (PCV13) vaccine.** / Consult your health care provider.  Pneumococcal polysaccharide (PPSV23) vaccine.** / 1 dose for all adults aged 66 years and older.  Meningococcal vaccine.** / Consult your health care provider.  Hepatitis A vaccine.** / Consult your health care provider.  Hepatitis B vaccine.** / Consult your health care provider.  Haemophilus influenzae type b (Hib) vaccine.** / Consult your health care provider. ** Family history and personal history of risk and conditions may change your health care provider's recommendations. Document Released: 03/01/2001 Document Revised: 05/20/2013 Document Reviewed: 05/31/2010 [facility] Patient Information 2015 [place], [place]. This information is not intended to replace advice given to you by your health care provider. Make sure you discuss any questions you have with your health care provider.

## 2013-12-20 NOTE — Assessment & Plan Note (Signed)
Encouraged DASH diet, decrease po intake and increase exercise as tolerated. Needs 7-8 hours of sleep nightly. Avoid trans fats, eat small, frequent meals every 4-5 hours with lean proteins, complex carbs and healthy fats. Minimize simple carbs, GMO foods. 

## 2013-12-21 LAB — CBC
HEMATOCRIT: 40.2 % (ref 36.0–46.0)
Hemoglobin: 13.2 g/dL (ref 12.0–15.0)
MCHC: 32.8 g/dL (ref 30.0–36.0)
MCV: 83.9 fl (ref 78.0–100.0)
Platelets: 347 10*3/uL (ref 150.0–400.0)
RBC: 4.79 Mil/uL (ref 3.87–5.11)
RDW: 14.3 % (ref 11.5–15.5)
WBC: 10.1 10*3/uL (ref 4.0–10.5)

## 2013-12-21 LAB — TSH: TSH: 2.55 u[IU]/mL (ref 0.35–4.50)

## 2013-12-22 ENCOUNTER — Encounter: Payer: Self-pay | Admitting: Family Medicine

## 2013-12-22 LAB — RENAL FUNCTION PANEL
ALBUMIN: 4.5 g/dL (ref 3.5–5.2)
BUN: 17 mg/dL (ref 6–23)
CALCIUM: 9.8 mg/dL (ref 8.4–10.5)
CO2: 21 meq/L (ref 19–32)
CREATININE: 0.5 mg/dL (ref 0.4–1.2)
Chloride: 109 mEq/L (ref 96–112)
GFR: 151.39 mL/min (ref >60.00)
Glucose, Bld: 74 mg/dL (ref 70–99)
Phosphorus: 3.8 mg/dL (ref 2.3–4.6)
Potassium: 4.5 mEq/L (ref 3.5–5.1)
Sodium: 143 mEq/L (ref 135–145)

## 2013-12-22 LAB — LIPID PANEL
CHOL/HDL RATIO: 3
Cholesterol: 230 mg/dL — ABNORMAL HIGH (ref 0–200)
HDL: 78.1 mg/dL (ref >39.00)
LDL Cholesterol: 137 mg/dL — ABNORMAL HIGH (ref 0–99)
NONHDL: 151.9
Triglycerides: 73 mg/dL (ref 0.0–149.0)
VLDL: 14.6 mg/dL (ref 0.0–40.0)

## 2013-12-22 LAB — HEPATIC FUNCTION PANEL
ALT: 20 U/L (ref 0–35)
AST: 16 U/L (ref 0–37)
Albumin: 4.5 g/dL (ref 3.5–5.2)
Alkaline Phosphatase: 113 U/L (ref 39–117)
BILIRUBIN TOTAL: 0.4 mg/dL (ref 0.2–1.2)
Bilirubin, Direct: 0 mg/dL (ref 0.0–0.3)
Total Protein: 7.9 g/dL (ref 6.0–8.3)
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
## 2013-12-22 NOTE — Assessment & Plan Note (Signed)
Encouraged heart healthy diet, increase exercise, avoid trans fats, consider a krill oil cap daily 

## 2013-12-22 NOTE — Assessment & Plan Note (Signed)
Encouraged increased hydration, 64 ounces of clear fluids daily. Minimize alcohol and caffeine. Eat small frequent meals with lean proteins and complex carbs. Avoid high and low blood sugars. Get adequate sleep, 7-8 hours a night. Needs exercise daily preferably in the morning.  

## 2013-12-22 NOTE — Assessment & Plan Note (Signed)
At 6 months of nursing and doing well. Increase hydration.

## 2013-12-22 NOTE — Assessment & Plan Note (Signed)
Repeat cbc

## 2014-02-04 ENCOUNTER — Encounter: Payer: Self-pay | Admitting: Family Medicine

## 2014-02-04 ENCOUNTER — Ambulatory Visit (INDEPENDENT_AMBULATORY_CARE_PROVIDER_SITE_OTHER): Payer: 59 | Admitting: Family Medicine

## 2014-02-04 VITALS — BP 114/81 | HR 66 | Temp 97.7°F | Wt 201.4 lb

## 2014-02-04 DIAGNOSIS — R195 Other fecal abnormalities: Secondary | ICD-10-CM

## 2014-02-04 DIAGNOSIS — K589 Irritable bowel syndrome without diarrhea: Secondary | ICD-10-CM

## 2014-02-04 NOTE — Progress Notes (Signed)
  Subjective:     Michelle Moody is a 29 y.o. female who presents for evaluation of diarrhea. Onset of diarrhea was 1 months ago. Diarrhea is occurring approximately 2 times per day. Patient describes diarrhea as semisolid, watery and clay colored. Diarrhea has been associated with abdominal pain described as cramping. Patient denies blood in stool, fever, illness in household contacts, recent antibiotic use, recent camping, recent travel, significant abdominal pain, unintentional weight loss. Previous visits for diarrhea: none. Evaluation to date: none.  Treatment to date: none The following portions of the patient's history were reviewed and updated as appropriate: allergies, current medications, past family history, past medical history, past social history, past surgical history and problem list.  Review of Systems Pertinent items are noted in HPI.    Objective:    BP 114/81 mmHg  Pulse 66  Temp(Src) 97.7 F (36.5 C) (Oral)  Wt 201 lb 6.4 oz (91.354 kg)  SpO2 99% General: alert, cooperative, appears stated age and no distress  Hydration:  well hydrated  Abdomen:    soft, non-tender; bowel sounds normal; no masses,  no organomegaly    Assessment:    loose stools-- ? if from magnesium vs propranolol   Plan:    Follow up as needed. GI consult. Lab studies per orders. Medications per orders.

## 2014-02-04 NOTE — Progress Notes (Signed)
Pre visit review using our clinic review tool, if applicable. No additional management support is needed unless otherwise documented below in the visit note. 

## 2014-02-04 NOTE — Patient Instructions (Signed)
Irritable Bowel Syndrome Irritable bowel syndrome (IBS) is caused by a disturbance of normal bowel function and is a common digestive disorder. You may also hear this condition called spastic colon, mucous colitis, and irritable colon. There is no cure for IBS. However, symptoms often gradually improve or disappear with a good diet, stress management, and medicine. This condition usually appears in late adolescence or early adulthood. Women develop it twice as often as men. CAUSES  After food has been digested and absorbed in the small intestine, waste material is moved into the large intestine, or colon. In the colon, water and salts are absorbed from the undigested products coming from the small intestine. The remaining residue, or fecal material, is held for elimination. Under normal circumstances, gentle, rhythmic contractions of the bowel walls push the fecal material along the colon toward the rectum. In IBS, however, these contractions are irregular and poorly coordinated. The fecal material is either retained too long, resulting in constipation, or expelled too soon, producing diarrhea. SIGNS AND SYMPTOMS  The most common symptom of IBS is abdominal pain. It is often in the lower left side of the abdomen, but it may occur anywhere in the abdomen. The pain comes from spasms of the bowel muscles happening too much and from the buildup of gas and fecal material in the colon. This pain:  Can range from sharp abdominal cramps to a dull, continuous ache.  Often worsens soon after eating.  Is often relieved by having a bowel movement or passing gas. Abdominal pain is usually accompanied by constipation, but it may also produce diarrhea. The diarrhea often occurs right after a meal or upon waking up in the morning. The stools are often soft, watery, and flecked with mucus. Other symptoms of IBS include:  Bloating.  Loss of appetite.  Heartburn.  Backache.  Dull pain in the arms or  shoulders.  Nausea.  Burping.  Vomiting.  Gas. IBS may also cause symptoms that are unrelated to the digestive system, such as:  Fatigue.  Headaches.  Anxiety.  Shortness of breath.  Trouble concentrating.  Dizziness. These symptoms tend to come and go. DIAGNOSIS  The symptoms of IBS may seem like symptoms of other, more serious digestive disorders. Your health care provider may want to perform tests to exclude these disorders.  TREATMENT Many medicines are available to help correct bowel function or relieve bowel spasms and abdominal pain. Among the medicines available are:  Laxatives for severe constipation and to help restore normal bowel habits.  Specific antidiarrheal medicines to treat severe or lasting diarrhea.  Antispasmodic agents to relieve intestinal cramps. Your health care provider may also decide to treat you with a mild tranquilizer or sedative during unusually stressful periods in your life. Your health care provider may also prescribe antidepressant medicine. The use of this medicine has been shown to reduce pain and other symptoms of IBS. Remember that if any medicine is prescribed for you, you should take it exactly as directed. Make sure your health care provider knows how well it worked for you. HOME CARE INSTRUCTIONS   Take all medicines as directed by your health care provider.  Avoid foods that are high in fat or oils, such as heavy cream, butter, frankfurters, sausage, and other fatty meats.  Avoid foods that make you go to the bathroom, such as fruit, fruit juice, and dairy products.  Cut out carbonated drinks, chewing gum, and "gassy" foods such as beans and cabbage. This may help relieve bloating and burping.    Eat foods with bran, and drink plenty of liquids with the bran foods. This helps relieve constipation.  Keep track of what foods seem to bring on your symptoms.  Avoid emotionally charged situations or circumstances that produce  anxiety.  Start or continue exercising.  Get plenty of rest and sleep. Document Released: 01/03/2005 Document Revised: 01/08/2013 Document Reviewed: 08/24/2007 [employer] Patient Information 2015 [employer]. This information is not intended to replace advice given to you by your health care provider. Make sure you discuss any questions you have with your health care provider.  

## 2014-02-05 ENCOUNTER — Other Ambulatory Visit: Payer: Self-pay

## 2014-02-05 ENCOUNTER — Encounter: Payer: Self-pay | Admitting: Family Medicine

## 2014-02-05 ENCOUNTER — Encounter: Payer: Self-pay | Admitting: Internal Medicine

## 2014-02-05 ENCOUNTER — Other Ambulatory Visit (INDEPENDENT_AMBULATORY_CARE_PROVIDER_SITE_OTHER): Payer: 59

## 2014-02-05 DIAGNOSIS — K589 Irritable bowel syndrome without diarrhea: Secondary | ICD-10-CM

## 2014-02-05 DIAGNOSIS — R195 Other fecal abnormalities: Secondary | ICD-10-CM

## 2014-02-05 LAB — CBC WITH DIFFERENTIAL/PLATELET
BASOS ABS: 0 10*3/uL (ref 0.0–0.1)
BASOS PCT: 0.5 % (ref 0.0–3.0)
Eosinophils Absolute: 0.1 10*3/uL (ref 0.0–0.7)
Eosinophils Relative: 1.3 % (ref 0.0–5.0)
HEMATOCRIT: 35.6 % — AB (ref 36.0–46.0)
Hemoglobin: 12.1 g/dL (ref 12.0–15.0)
LYMPHS ABS: 2.5 10*3/uL (ref 0.7–4.0)
LYMPHS PCT: 28.4 % (ref 12.0–46.0)
MCHC: 34.1 g/dL (ref 30.0–36.0)
MCV: 81.6 fl (ref 78.0–100.0)
MONO ABS: 0.6 10*3/uL (ref 0.1–1.0)
MONOS PCT: 7.3 % (ref 3.0–12.0)
Neutro Abs: 5.4 10*3/uL (ref 1.4–7.7)
Neutrophils Relative %: 62.5 % (ref 43.0–77.0)
Platelets: 331 10*3/uL (ref 150.0–400.0)
RBC: 4.37 Mil/uL (ref 3.87–5.11)
RDW: 13.4 % (ref 11.5–15.5)
WBC: 8.7 10*3/uL (ref 4.0–10.5)

## 2014-02-05 LAB — BASIC METABOLIC PANEL
BUN: 11 mg/dL (ref 6–23)
CALCIUM: 9.3 mg/dL (ref 8.4–10.5)
CHLORIDE: 105 meq/L (ref 96–112)
CO2: 29 meq/L (ref 19–32)
Creatinine, Ser: 0.56 mg/dL (ref 0.40–1.20)
GFR: 135.78 mL/min (ref >60.00)
GLUCOSE: 100 mg/dL — AB (ref 70–99)
POTASSIUM: 4.1 meq/L (ref 3.5–5.1)
Sodium: 139 mEq/L (ref 135–145)

## 2014-02-05 LAB — H. PYLORI ANTIBODY, IGG: H Pylori IgG: NEGATIVE

## 2014-02-06 ENCOUNTER — Encounter: Payer: Self-pay | Admitting: *Deleted

## 2014-02-06 LAB — TSH: TSH: 4.37 u[IU]/mL (ref 0.35–4.50)

## 2014-03-25 ENCOUNTER — Encounter: Payer: Self-pay | Admitting: Internal Medicine

## 2014-03-25 ENCOUNTER — Other Ambulatory Visit (INDEPENDENT_AMBULATORY_CARE_PROVIDER_SITE_OTHER): Payer: 59

## 2014-03-25 ENCOUNTER — Ambulatory Visit (INDEPENDENT_AMBULATORY_CARE_PROVIDER_SITE_OTHER): Payer: 59 | Admitting: Internal Medicine

## 2014-03-25 VITALS — BP 110/70 | HR 68 | Ht 64.0 in | Wt 209.4 lb

## 2014-03-25 DIAGNOSIS — R197 Diarrhea, unspecified: Secondary | ICD-10-CM

## 2014-03-25 DIAGNOSIS — R1030 Lower abdominal pain, unspecified: Secondary | ICD-10-CM

## 2014-03-25 DIAGNOSIS — K529 Noninfective gastroenteritis and colitis, unspecified: Secondary | ICD-10-CM

## 2014-03-25 LAB — IGA: IgA: 223 mg/dL (ref 68–378)

## 2014-03-25 LAB — HIGH SENSITIVITY CRP: CRP, High Sensitivity: 5.52 mg/L — ABNORMAL HIGH (ref 0.000–5.000)

## 2014-03-25 NOTE — Patient Instructions (Signed)
Your physician has requested that you go to the basement for the lab work before leaving today  Please follow up with Dr. Henrene Pastor on 05/06/2014 at 9:00am

## 2014-03-25 NOTE — Progress Notes (Signed)
HISTORY OF PRESENT ILLNESS:  Michelle Moody is a 29 y.o. female with a history of hyperlipidemia, migraine headaches, and hypertension who presents today regarding problems with abnormal bowel habits and diarrhea. The patient reports a long history of intestinal issues with tendencies toward constipation. She was evaluated in [facility] April 2011 for abdominal pain, diarrhea, and rectal bleeding. She underwent complete colonoscopy with intubation of the terminal ileum. She was said to have several scattered erosions in the ileum. The examination was otherwise normal including the colonic mucosa. Ileal biopsies were said to show "mild active ileitis" without further description. The patient denies having been given a diagnosis or treatment. She was however told have a colonoscopy in 5 years because her mother had a history of "polyps". Since that time, she reports a tendency toward constipation with occasional loose stools. However, in December, she developed severe loose bowels with cramping. This persisted for 1 month. Occasional nocturnal symptoms. Now with one loose bowel movement daily. She has had fluctuating weight. Nausea without vomiting. Lower abdominal discomfort intermittently pre-defecation. Relieved post-defecation. She had been on multiple antibiotics for mastitis (had her second daughter June 2015). No rectal bleeding sent for passing hard constipated stools at times.  REVIEW OF SYSTEMS:  All non-GI ROS negative except for back pain, breast enlargement, visual change, fatigue, migraines, sore throat, urinary leakage  Past Medical History  Diagnosis Date  . Hyperlipidemia   . Migraines   . History of dental surgery     2007  . Gallstones     02/2012  . Anxiety     2011  . Obese   . Anemia 07/06/2013  . IBS (irritable bowel syndrome)   . Elevated blood pressure     Past Surgical History  Procedure Laterality Date  . Dental surgery      Social History Michelle Moody   reports that she has never smoked. She has never used smokeless tobacco. She reports that she does not drink alcohol or use illicit drugs.  family history includes Alcohol abuse in her maternal grandfather; Alzheimer's disease in her father and paternal grandmother; Cancer in her maternal grandfather and maternal grandmother; Cancer (age of onset: 48) in her paternal aunt; Cancer (age of onset: 77) in her father; Colon polyps in her mother; Crohn's disease in her paternal uncle; Dementia in her father and paternal grandmother; Heart disease in her paternal grandfather; Hyperlipidemia in her brother, father, and paternal grandfather; Hypertension in her father and paternal grandfather; Stroke in her paternal grandmother; Thyroid disease in her mother.  Allergies  Allergen Reactions  . Clindamycin/Lincomycin Nausea And Vomiting  . Latex Rash       PHYSICAL EXAMINATION: Vital signs: BP 110/70 mmHg  Pulse 68  Ht 5\' 4"  (1.626 m)  Wt 209 lb 6.4 oz (94.983 kg)  BMI 35.93 kg/m2  SpO2 98%  Constitutional: generally well-appearing, no acute distress Psychiatric: alert and oriented x3, cooperative Eyes: extraocular movements intact, anicteric, conjunctiva pink Mouth: oral pharynx moist, no lesions Neck: supple no lymphadenopathy Cardiovascular: heart regular rate and rhythm, no murmur Lungs: clear to auscultation bilaterally Abdomen: soft, nontender, nondistended, no obvious ascites, no peritoneal signs, normal bowel sounds, no organomegaly Rectal: Omitted Extremities: no lower extremity edema bilaterally Skin: no lesions on visible extremities Neuro: No focal deficits.   ASSESSMENT:   #1. Recent problems with diarrhea described. Rule out Clostridium difficile related, postinfectious, celiac disease, IBD, IBS #2. Chronic abdominal complaints as described. Rule out functional versus organic as above #3. Colonoscopy  in [place] 2011 revealing "ileitis" ;clinical significance  unclear   PLAN:  #1. Stool studies for enteric pathogens and Clostridium difficile by PCR #2. Tissue transglutaminase antibody, serum IgA to screen for celiac sprue #3. C reactive protein to assess for inflammation #4. Imodium as needed #5. Routine office follow-up in 6 weeks assuming the above negative. Should clinical symptoms persist, may need colonoscopy with ileoscopy  A copy has been sent to Dr. Etter Sjogren

## 2014-03-26 LAB — TISSUE TRANSGLUTAMINASE, IGA: TISSUE TRANSGLUTAMINASE AB, IGA: 1 U/mL (ref <4)

## 2014-03-27 ENCOUNTER — Ambulatory Visit (INDEPENDENT_AMBULATORY_CARE_PROVIDER_SITE_OTHER): Payer: 59 | Admitting: Sports Medicine

## 2014-03-27 ENCOUNTER — Ambulatory Visit (INDEPENDENT_AMBULATORY_CARE_PROVIDER_SITE_OTHER): Payer: 59

## 2014-03-27 ENCOUNTER — Encounter: Payer: Self-pay | Admitting: Sports Medicine

## 2014-03-27 VITALS — BP 114/71 | HR 79 | Wt 207.0 lb

## 2014-03-27 DIAGNOSIS — D234 Other benign neoplasm of skin of scalp and neck: Secondary | ICD-10-CM | POA: Diagnosis not present

## 2014-03-27 DIAGNOSIS — M25571 Pain in right ankle and joints of right foot: Secondary | ICD-10-CM

## 2014-03-27 DIAGNOSIS — S93622A Sprain of tarsometatarsal ligament of left foot, initial encounter: Secondary | ICD-10-CM | POA: Insufficient documentation

## 2014-03-27 NOTE — Assessment & Plan Note (Signed)
In a patient with pes cavus, return for custom orthotics. Over-the-counter analgesics, injection if no better. X-rays.

## 2014-03-27 NOTE — Progress Notes (Signed)
  Subjective:    CC: Left foot pain  HPI: Left foot pain: For sometime now this pleasant 29 year old female has noted increasing pain in the left foot, over the dorsum and at the first metatarsocuneiform joint. Moderate, persistent without radiation, no trauma.  Sebaceous cyst: 3 large ones on the right side of the scalp, amenable to excision.  Past medical history, Surgical history, Family history not pertinant except as noted below, Social history, Allergies, and medications have been entered into the medical record, reviewed, and no changes needed.   Review of Systems: No fevers, chills, night sweats, weight loss, chest pain, or shortness of breath.   Objective:    General: Well Developed, well nourished, and in no acute distress.  Neuro: Alert and oriented x3, extra-ocular muscles intact, sensation grossly intact.  HEENT: Normocephalic, atraumatic, pupils equal round reactive to light, neck supple, no masses, no lymphadenopathy, thyroid nonpalpable. There are several palpable sebaceous cysts, the ones of concern are in a group of 3 on the right side of her scalp. Skin: Warm and dry, no rashes. Cardiac: Regular rate and rhythm, no murmurs rubs or gallops, no lower extremity edema.  Respiratory: Clear to auscultation bilaterally. Not using accessory muscles, speaking in full sentences. Left Foot: No visible erythema or swelling. Range of motion is full in all directions. Strength is 5/5 in all directions. No hallux valgus. Pes cavus. No abnormal callus noted. No pain over the navicular prominence, or base of fifth metatarsal. No tenderness to palpation of the calcaneal insertion of plantar fascia. No pain at the Achilles insertion. No pain over the calcaneal bursa. No pain of the retrocalcaneal bursa. Tender to palpation at the first metatarsocuneiform joint, no tenderness over the dorsum of the metatarsal shafts No hallux rigidus or limitus. No tenderness palpation over  interphalangeal joints. No pain with compression of the metatarsal heads. Neurovascularly intact distally.  Impression and Recommendations:

## 2014-03-27 NOTE — Assessment & Plan Note (Addendum)
We have removed multiple sebaceous cysts from her scalp in the recent past, all removed en bloc. Return in a 30 minute visit for excision of 3 sebaceous cysts on the right side of the scalp. I would like a 30 minute slot as the sebaceous cyst on the right side of her scalp or in the vicinity of the superficial temporal artery, we will likely need to map the course of the artery before proceeding with incision. Patient has agreed to shave part of her scalp for this.

## 2014-03-28 ENCOUNTER — Other Ambulatory Visit: Payer: 59

## 2014-04-02 ENCOUNTER — Other Ambulatory Visit: Payer: Self-pay

## 2014-04-02 LAB — GI PATHOGEN PANEL BY PCR, STOOL
C difficile Toxin A/B: NOT DETECTED
CRYPTOSPORIDIUM: NOT DETECTED
Campylobacter: NOT DETECTED
E. COLI O157: NOT DETECTED
ENTEROTOXIGENIC E COLI (ETEC): NOT DETECTED
NOROVIRUS GI/GII: NOT DETECTED
Rotavirus A: NOT DETECTED
Salmonella: NOT DETECTED
Shiga Toxin-producing E. coli: NOT DETECTED
Shigella: NOT DETECTED

## 2014-04-02 MED ORDER — METRONIDAZOLE 250 MG PO TABS
250.0000 mg | ORAL_TABLET | Freq: Four times a day (QID) | ORAL | Status: DC
Start: 2014-04-02 — End: 2014-04-28

## 2014-04-03 ENCOUNTER — Ambulatory Visit (INDEPENDENT_AMBULATORY_CARE_PROVIDER_SITE_OTHER): Payer: 59 | Admitting: Sports Medicine

## 2014-04-03 ENCOUNTER — Encounter: Payer: Self-pay | Admitting: Sports Medicine

## 2014-04-03 VITALS — BP 132/79 | HR 84 | Wt 209.0 lb

## 2014-04-03 DIAGNOSIS — S93622S Sprain of tarsometatarsal ligament of left foot, sequela: Secondary | ICD-10-CM | POA: Diagnosis not present

## 2014-04-03 NOTE — Assessment & Plan Note (Signed)
With pes cavus, custom orthotics as above.

## 2014-04-03 NOTE — Progress Notes (Signed)

## 2014-04-06 ENCOUNTER — Encounter: Payer: Self-pay | Admitting: *Deleted

## 2014-04-06 ENCOUNTER — Emergency Department
Admission: EM | Admit: 2014-04-06 | Discharge: 2014-04-06 | Disposition: A | Discharge status: Home / Self Care | Payer: 59 | Source: Home / Self Care | Attending: Emergency Medicine | Admitting: Emergency Medicine

## 2014-04-06 DIAGNOSIS — G43101 Migraine with aura, not intractable, with status migrainosus: Secondary | ICD-10-CM | POA: Diagnosis not present

## 2014-04-06 MED ORDER — ELETRIPTAN HYDROBROMIDE 40 MG PO TABS: 40.0000 mg | ORAL_TABLET | ORAL | Status: DC | PRN

## 2014-04-06 MED ORDER — KETOROLAC TROMETHAMINE 60 MG/2ML IM SOLN
60.0000 mg | Freq: Once | INTRAMUSCULAR | Status: AC
  Administered 2014-04-06: 60 mg via INTRAMUSCULAR

## 2014-04-06 MED ORDER — KETOROLAC TROMETHAMINE 60 MG/2ML IM SOLN: 60.0000 mg | Freq: Once | INTRAMUSCULAR | Status: DC

## 2014-04-06 MED ORDER — PROMETHAZINE HCL 25 MG PO TABS: 25.0000 mg | ORAL_TABLET | Freq: Four times a day (QID) | ORAL | Status: DC | PRN

## 2014-04-06 MED ORDER — METHYLPREDNISOLONE ACETATE 80 MG/ML IJ SUSP
80.0000 mg | Freq: Once | INTRAMUSCULAR | Status: AC
  Administered 2014-04-06: 80 mg via INTRAMUSCULAR

## 2014-04-06 NOTE — Discharge Instructions (Signed)
Recurrent Migraine Headache A migraine headache is an intense, throbbing pain on one or both sides of your head. Recurrent migraines keep coming back. A migraine can last for 30 minutes to several hours. CAUSES  The exact cause of a migraine headache is not always known. However, a migraine may be caused when nerves in the brain become irritated and release chemicals that cause inflammation. This causes pain. Certain things may also trigger migraines, such as:   Alcohol.  Smoking.  Stress.  Menstruation.  Aged cheeses.  Foods or drinks that contain nitrates, glutamate, aspartame, or tyramine.  Lack of sleep.  Chocolate.  Caffeine.  Hunger.  Physical exertion.  Fatigue.  Medicines used to treat chest pain (nitroglycerine), birth control pills, estrogen, and some blood pressure medicines. SYMPTOMS   Pain on one or both sides of your head.  Pulsating or throbbing pain.  Severe pain that prevents daily activities.  Pain that is aggravated by any physical activity.  Nausea, vomiting, or both.  Dizziness.  Pain with exposure to bright lights, loud noises, or activity.  General sensitivity to bright lights, loud noises, or smells. Before you get a migraine, you may get warning signs that a migraine is coming (aura). An aura may include:  Seeing flashing lights.  Seeing bright spots, halos, or zigzag lines.  Having tunnel vision or blurred vision.  Having feelings of numbness or tingling.  Having trouble talking.  Having muscle weakness. DIAGNOSIS  A recurrent migraine headache is often diagnosed based on:  Symptoms.  Physical examination.  A CT scan or MRI of your head. These imaging tests cannot diagnose migraines but can help rule out other causes of headaches.  TREATMENT  Medicines may be given for pain and nausea. Medicines can also be given to help prevent recurrent migraines. Here in urgent care, following 2 injections given: Toradol and  Depo-Medrol IM Written prescriptions given: Promethazine 25 mg by mouth every 6 hours if needed for nausea. Relpax refill.  Follow-up with Dr. Randel Pigg or your PCP within one week. HOME CARE INSTRUCTIONS  Only take over-the-counter or prescription medicines for pain or discomfort as directed by your health care provider. The use of long-term narcotics is not recommended.  Lie down in a dark, quiet room when you have a migraine.  Keep a journal to find out what may trigger your migraine headaches. For example, write down:  What you eat and drink.  How much sleep you get.  Any change to your diet or medicines.  Limit alcohol consumption.  Quit smoking if you smoke.  Get 7-9 hours of sleep, or as recommended by your health care provider.  Limit stress.  Keep lights dim if bright lights bother you and make your migraines worse. SEEK MEDICAL CARE IF:   You do not get relief from the medicines given to you.  You have a recurrence of pain.  You have a fever. SEEK IMMEDIATE MEDICAL CARE IF:  Your migraine becomes severe.  You have a stiff neck.  You have loss of vision.  You have muscular weakness or loss of muscle control.  You start losing your balance or have trouble walking.  You feel faint or pass out.  You have severe symptoms that are different from your first symptoms. MAKE SURE YOU:   Understand these instructions.  Will watch your condition.  Will get help right away if you are not doing well or get worse. Document Released: 09/28/2000 Document Revised: 05/20/2013 Document Reviewed: 09/10/2012 [employer] Patient Information 2015  [employer]. This information is not intended to replace advice given to you by your health care provider. Make sure you discuss any questions you have with your health care provider.

## 2014-04-06 NOTE — ED Notes (Signed)
Pt has a history of migraines and was started on Propanalol in Dec and has not had one since, until yesterday.  Pain is 7/10 with light sensitivity.  Pt is still currently Nursing.

## 2014-04-07 ENCOUNTER — Telehealth: Payer: Self-pay | Admitting: Internal Medicine

## 2014-04-07 MED ORDER — TINIDAZOLE 500 MG PO TABS: 2.0000 g | ORAL_TABLET | Freq: Once | ORAL | Status: DC

## 2014-04-07 NOTE — Telephone Encounter (Signed)
Pt aware and script sent to pharmacy. 

## 2014-04-07 NOTE — Telephone Encounter (Signed)
Tinidazole 2 g single-dose (#4, 500 mg tabs). She should suspend breast feeding for 3 days after taking the medication.

## 2014-04-07 NOTE — Telephone Encounter (Signed)
Pt started Flagyl on Sat and reports she was very nauseated and within an hour of taking it developed a migraine. Pt states she took the second dose and was in the floor vomiting very sick. Pt was seen in urgent care yesterday and given a toradol injection and a steroid injection. Urgent care md wanted her to call today to see what Dr. Henrene Pastor wanted her to do. Pt was positive for giardia. Please advise.

## 2014-04-08 NOTE — ED Provider Notes (Signed)
CSN: [id number]     Arrival date & time 04/06/14  1217 History   First MD Initiated Contact with Patient 04/06/14 1246     Chief Complaint  Patient presents with  . Migraine    HPI Pt has a long history of migraines, and was started on Propanalol in Dec for prophylaxis, and has not had migraine since, until yesterday.  She does not recall specific trigger, onset of migraine, right parietal headache throbbing. Pain is 7/10 with light sensitivity. Has nausea but no vomiting. 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 Denies head trauma. No fever or chills or other HEENT symptoms. No cardiorespiratory symptoms. She tried a Relpax po yesterday and that helped a little. She has since run out Relpax and requests a refill.--In the past, this has been effective for migraines. I asked her if she's ever had injectable Imitrex, and she does not recall that. She declines Imitrex shot today.  She states Toradol and Phenergan shots have helped in the past. She denies chance of pregnancy. Past Medical History  Diagnosis Date  . Hyperlipidemia   . Migraines   . History of dental surgery     2007  . Gallstones     02/2012  . Anxiety     2011  . Obese   . Anemia 07/06/2013  . IBS (irritable bowel syndrome)   . Elevated blood pressure    Past Surgical History  Procedure Laterality Date  . Dental surgery     Family History  Problem Relation Age of Onset  . Cancer Father 60    prostate  . Hyperlipidemia Father   . Hypertension Father   . Alzheimer's disease Father   . Dementia Father   . Cancer Maternal Grandmother     ovarian  . Alcohol abuse Maternal Grandfather   . Cancer Maternal Grandfather     lung- smoker  . Stroke Paternal Grandmother   . Alzheimer's disease Paternal Grandmother   . Dementia Paternal Grandmother   . Heart disease Paternal Grandfather   . Hyperlipidemia Paternal Grandfather   . Hypertension Paternal  Grandfather   . Thyroid disease Mother   . Colon polyps Mother   . Hyperlipidemia Brother   . Cancer Paternal Aunt 73    colon  . Crohn's disease Paternal Uncle    History  Substance Use Topics  . Smoking status: Never Smoker   . Smokeless tobacco: Never Used  . Alcohol Use: No   OB History    Gravida Para Term Preterm AB TAB SAB Ectopic Multiple Living   2 2 2       2      Review of Systems  All other systems reviewed and are negative.   Allergies  Clindamycin/lincomycin and Latex  Home Medications   Prior to Admission medications   Medication Sig Start Date End Date Taking? Authorizing Provider  eletriptan (RELPAX) 40 MG tablet Take 1 tablet (40 mg total) by mouth as needed for migraine or headache. May repeat in 2 hours if headache persists or recurs. 04/06/14   Jacqulyn Cane, MD  Magnesium 400 MG TABS Take 1 tablet by mouth daily.    Historical Provider, MD  metroNIDAZOLE (FLAGYL) 250 MG tablet Take 1 tablet (250 mg total) by mouth 4 (four) times daily. 04/02/14   Irene Shipper, MD  Prenatal Vit-Fe Fumarate-FA (PRENATAL MULTIVITAMIN) TABS tablet Take 1 tablet by mouth daily at 12 noon.    Historical  Provider, MD  promethazine (PHENERGAN) 25 MG tablet Take 1 tablet (25 mg total) by mouth every 6 (six) hours as needed for nausea or vomiting. 11/06/13   Gregor Hams, MD  promethazine (PHENERGAN) 25 MG tablet Take 1 tablet (25 mg total) by mouth every 6 (six) hours as needed for nausea. 04/06/14   Jacqulyn Cane, MD  propranolol (INDERAL) 10 MG tablet Take 1 tablet (10 mg total) by mouth 2 (two) times daily. 12/20/13   Mosie Lukes, MD  tinidazole (TINDAMAX) 500 MG tablet Take 4 tablets (2,000 mg total) by mouth once. 04/07/14   Irene Shipper, MD   BP 112/75 mmHg  Pulse 83  Temp(Src) 98 F (36.7 C) (Oral)  Ht 5\' 4"  (1.626 m)  Wt 207 lb 8 oz (94.121 kg)  BMI 35.60 kg/m2  SpO2 100%  Breastfeeding? Yes Physical Exam  Constitutional: She appears well-developed and well-nourished.  She appears distressed (Uncomfortable from headache.).  HENT:  Head: Normocephalic and atraumatic. Head is without raccoon's eyes, without contusion, without right periorbital erythema and without left periorbital erythema.  Right Ear: External ear and ear canal normal. No drainage. Tympanic membrane is not erythematous.  Left Ear: External ear and ear canal normal. No drainage. Tympanic membrane is not erythematous.  Nose: Nose normal. Right sinus exhibits no maxillary sinus tenderness. Left sinus exhibits no maxillary sinus tenderness.  Mouth/Throat: Oropharynx is clear and moist and mucous membranes are normal. No oral lesions. No dental abscesses. No oropharyngeal exudate, posterior oropharyngeal edema or posterior oropharyngeal erythema.      No cranial bruits.  Normal temporal arteries.  Eyes: Conjunctivae, EOM and lids are normal. Pupils are equal, round, and reactive to light.  Fundoscopic exam:      The right eye shows no exudate, no hemorrhage and no papilledema.       The left eye shows no exudate, no hemorrhage and no papilledema.  Neck: Neck supple. Normal carotid pulses and no JVD present. Carotid bruit is not present. No tracheal deviation present. No thyromegaly present.  Cardiovascular: Regular rhythm and normal heart sounds.   Pulmonary/Chest: Effort normal and breath sounds normal. No respiratory distress. She has no wheezes. She has no rales.  Abdominal: Soft. There is no tenderness.  Musculoskeletal: She exhibits no edema or tenderness.  Neurological: She is alert. She has normal strength and normal reflexes. No cranial nerve deficit or sensory deficit. Coordination normal.  Reflex Scores:      Tricep reflexes are 2+ on the right side and 2+ on the left side.      Bicep reflexes are 2+ on the right side and 2+ on the left side.      Brachioradialis reflexes are 2+ on the right side and 2+ on the left side.      Patellar reflexes are 2+ on the right side and 2+ on the  left side.      Achilles reflexes are 2+ on the right side and 2+ on the left side. Skin: Skin is warm and dry. No rash noted.  Psychiatric: She has a normal mood and affect.  Nursing note and vitals reviewed.   ED Course  Procedures (including critical care time) Labs Review Labs Reviewed - No data to display  Imaging Review No results found.   MDM   1. Migraine with aura and with status migrainosus, not intractable    Treatment options discussed, as well as risks, benefits, alternatives. Patient voiced understanding and agreement with the following plans: Toradol  60 mg and Depo-Medrol 80 mg IM stat. She was observed after resting and lying on exam table for 20 minutes, reevaluated and headache had eased off somewhat. Discharge Medication List as of 04/06/2014  1:26 PM    START taking these medications   Details  eletriptan (RELPAX) 40 MG tablet Take 1 tablet (40 mg total) by mouth as needed for migraine or headache. May repeat in 2 hours if headache persists or recurs., Starting 04/06/2014, Print    promethazine (PHENERGAN) 25 MG tablet Take 1 tablet (25 mg total) by mouth every 6 (six) hours as needed for nausea., Starting 04/06/2014, Print        Other advice given. Follow-up with your headache specialist or primary care doctor in 2 days if not improving, or sooner if symptoms become worse. Precautions discussed. Red flags discussed.--Emergency room if any red flag She'll pump her breasts and not nurse while on these meds, for the short-term. Questions invited and answered. Patient voiced understanding and agreement.     Jacqulyn Cane, MD 04/08/14 [phone]

## 2014-04-10 ENCOUNTER — Telehealth: Payer: Self-pay | Admitting: Sports Medicine

## 2014-04-10 ENCOUNTER — Ambulatory Visit: Payer: 59 | Admitting: Sports Medicine

## 2014-04-10 NOTE — Telephone Encounter (Signed)
I will remove her no-show.

## 2014-04-10 NOTE — Telephone Encounter (Signed)
Patient came for her appointment today at 2:30 and I advised her it was at 2:15 today not 2:30. She said it was suppose to be 30 mins and I adv it was scheduled for 15 mins and it had past. I wanted to send a phone note that she attempted to come but had the wrong time and she would call back to reschedule the appt so I am canceling her appt off the 2:15pm. Thanks

## 2014-04-21 ENCOUNTER — Encounter: Payer: Self-pay | Admitting: Sports Medicine

## 2014-04-21 ENCOUNTER — Ambulatory Visit (INDEPENDENT_AMBULATORY_CARE_PROVIDER_SITE_OTHER): Payer: 59 | Admitting: Sports Medicine

## 2014-04-21 DIAGNOSIS — D234 Other benign neoplasm of skin of scalp and neck: Secondary | ICD-10-CM

## 2014-04-21 MED ORDER — HYDROCODONE-ACETAMINOPHEN 5-325 MG PO TABS: 1.0000 | ORAL_TABLET | Freq: Three times a day (TID) | ORAL | Status: DC | PRN

## 2014-04-21 NOTE — Progress Notes (Signed)
  Subjective:    CC: Masses on scalp  HPI: Anairis returns, we have done several dermoid cyst excisions previously on her scalp, she comes intermittently and we take a few at a time. She has noted a few new lesions on the right side of her scalp and would like them checked out. Symptoms are moderate, persistent without radiation.  Past medical history, Surgical history, Family history not pertinant except as noted below, Social history, Allergies, and medications have been entered into the medical record, reviewed, and no changes needed.   Review of Systems: No fevers, chills, night sweats, weight loss, chest pain, or shortness of breath.   Objective:    General: Well Developed, well nourished, and in no acute distress.  Neuro: Alert and oriented x3, extra-ocular muscles intact, sensation grossly intact.  HEENT: Normocephalic, atraumatic, pupils equal round reactive to light, neck supple, no masses, no lymphadenopathy, thyroid nonpalpable. There are 3 masses on the right side of her scalp, the largest of which is approximately 3 cm across, the smaller ones are approximately 2.2 cm across. Skin: Warm and dry, no rashes. Cardiac: Regular rate and rhythm, no murmurs rubs or gallops, no lower extremity edema.  Respiratory: Clear to auscultation bilaterally. Not using accessory muscles, speaking in full sentences.  Procedure: Excision of three right sided scalp sebaceous cysts, 3 cm, 2.1 cm, 2.1 cm. Risks, benefits, and alternatives explained and consent obtained. Time out conducted. Surface prepped with chlorhexidine. A total of 10cc lidocaine with epinephine infiltrated in a field block around each one of the sebaceous cysts. Adequate anesthesia ensured. Area prepped and draped in a sterile fashion. Incision made with #15 blade. Using blunt dissection and sharp dissection, each of the sebaceous cysts were removed en bloc. A single 4-0 Ethilon horizontal mattress suture was placed to close  each of the 3 incisions. Hemostasis achieved. Patient stable and head strapped with compressive dressing.  Impression and Recommendations:    I spent 40 minutes with this patient, greater than 50% was face-to-face time with counseling regarding the above diagnoses.

## 2014-04-21 NOTE — Assessment & Plan Note (Signed)
Removal of 3 sebaceous cysts from the right scalp in block with primary closure. Return in a week for removal of sutures. Hydrocodone for pain. At future visits we will take her other sebaceous cysts.

## 2014-04-28 ENCOUNTER — Ambulatory Visit (INDEPENDENT_AMBULATORY_CARE_PROVIDER_SITE_OTHER): Payer: 59 | Admitting: Sports Medicine

## 2014-04-28 ENCOUNTER — Encounter: Payer: Self-pay | Admitting: Sports Medicine

## 2014-04-28 VITALS — BP 101/66 | HR 69 | Ht 64.0 in | Wt 210.0 lb

## 2014-04-28 DIAGNOSIS — D234 Other benign neoplasm of skin of scalp and neck: Secondary | ICD-10-CM

## 2014-04-28 NOTE — Assessment & Plan Note (Signed)
Surgical removal previously with stitch removal today. Return for future scalp sebaceous cyst removal, she will shave her head and corresponding locations prior to removal.

## 2014-04-28 NOTE — Progress Notes (Signed)
.   Subjective: This pleasant 29 year old female returns approximately one week post surgical excision of 3 sebaceous cysts from her scalp, she is doing extremely well. She does have several more that will be targets for excision at a future visit.  Objective: General: Well-developed, well-nourished, and in no acute distress. Scalp: Incisions are clean, dry, intact, sutures are in place, I removed all 3 sutures, there is no sign of bacterial superinfection. She does have 4-5 additional sebaceous cysts, 2 on the posterior scalp and one on the left side of the scalp that will be surgical targets at the next visit. She will shave these.  Assessment/plan:

## 2014-05-06 ENCOUNTER — Ambulatory Visit (INDEPENDENT_AMBULATORY_CARE_PROVIDER_SITE_OTHER): Payer: 59 | Admitting: Internal Medicine

## 2014-05-06 ENCOUNTER — Encounter: Payer: Self-pay | Admitting: Internal Medicine

## 2014-05-06 ENCOUNTER — Other Ambulatory Visit: Payer: 59

## 2014-05-06 VITALS — BP 102/84 | HR 88 | Ht 64.0 in | Wt 210.0 lb

## 2014-05-06 DIAGNOSIS — R197 Diarrhea, unspecified: Secondary | ICD-10-CM

## 2014-05-06 DIAGNOSIS — R1030 Lower abdominal pain, unspecified: Secondary | ICD-10-CM | POA: Diagnosis not present

## 2014-05-06 DIAGNOSIS — A071 Giardiasis [lambliasis]: Secondary | ICD-10-CM

## 2014-05-06 DIAGNOSIS — K529 Noninfective gastroenteritis and colitis, unspecified: Secondary | ICD-10-CM | POA: Diagnosis not present

## 2014-05-06 NOTE — Patient Instructions (Signed)
Your physician has requested that you go to the basement for the following lab work before leaving today: Stool pathogen panel   Please follow up with Dr. Henrene Pastor in 3 months

## 2014-05-06 NOTE — Progress Notes (Signed)
HISTORY OF PRESENT ILLNESS:  Michelle Moody is a 29 y.o. female with hyperlipidemia, hypertension, and chronic migraine headaches who was evaluated initially 03/25/2014 for abnormal bowel habits and diarrhea. The patient has a long history of intestinal issues with tendency toward constipation. Evaluated in [facility] April 2011 with colonoscopy at which time she was said to have "mild active ileitis". See the previous office note for details. Her current problems were diarrhea. Had received multiple antibiotics for mastitis. Stool enteric pathogen panel was obtained. No evidence for Clostridium difficile. However, was positive for Giardia. She was treated with metronidazole but had severe nausea with vomiting. This was subsequently changed to Tinidazole 2 g 1 time dose. She presents today for follow-up. She has had improvement in her bowels. Previously a loose bowel movement once daily. Now a loose bowel movement every 2 or 3 days. Still with some abdominal bloating. No new problems. She has lost no weight since her last visit. She does take magnesium chronically for migraine headaches . She continues to breast-feed. We did obtain CRP which was elevated at 5.5. Testing for celiac disease was negative.  REVIEW OF SYSTEMS:  All non-GI ROS negative except for headaches  Past Medical History  Diagnosis Date  . Hyperlipidemia   . Migraines   . History of dental surgery     2007  . Gallstones     02/2012  . Anxiety     2011  . Obese   . Anemia 07/06/2013  . IBS (irritable bowel syndrome)   . Elevated blood pressure     Past Surgical History  Procedure Laterality Date  . Dental surgery      Social History Michelle Moody  reports that she has never smoked. She has never used smokeless tobacco. She reports that she does not drink alcohol or use illicit drugs.  family history includes Alcohol abuse in her maternal grandfather; Alzheimer's disease in her father and paternal grandmother;  Cancer in her maternal grandfather and maternal grandmother; Cancer (age of onset: 29) in her paternal aunt; Cancer (age of onset: 21) in her father; Colon polyps in her mother; Crohn's disease in her paternal uncle; Dementia in her father and paternal grandmother; Heart disease in her paternal grandfather; Hyperlipidemia in her brother, father, and paternal grandfather; Hypertension in her father and paternal grandfather; Stroke in her paternal grandmother; Thyroid disease in her mother.  Allergies  Allergen Reactions  . Clindamycin/Lincomycin Nausea And Vomiting  . Latex Rash       PHYSICAL EXAMINATION: Vital signs: BP 102/84 mmHg  Pulse 88  Ht 5\' 4"  (1.626 m)  Wt 210 lb (95.255 kg)  BMI 36.03 kg/m2  LMP 04/16/2014 (Approximate) General: Well-developed, well-nourished, no acute distress HEENT: Sclerae are anicteric, conjunctiva pink. Oral mucosa intact Lungs: Clear Heart: Regular Abdomen: soft, nontender, nondistended, no obvious ascites, no peritoneal signs, normal bowel sounds. No organomegaly. Extremities: No edema Psychiatric: alert and oriented x3. Cooperative   ASSESSMENT:  #1. Recent problems with diarrhea secondary to Giardia. Intolerant to metronidazole. Subsequently treated with Tinidazole. Symptoms have improved, though not resolved. We discussed that magnesium a loose stools #2. Previous colonoscopy elsewhere 2011 suggesting "ileitis". Query IBD. Previously obtained CRP elevated, but active infection identified   PLAN:  #1. Recheck stools to assure eradication of Giardia #2. If negative, routine GI follow-up in 3 months. Depending upon clinical symptoms, she may benefit from repeat colonoscopy with ileal intubation and biopsies. We discussed this in detail again today. Contact the office in the  interim as needed  A copy sent to Dr. Charlett Blake

## 2014-05-09 LAB — GASTROINTESTINAL PATHOGEN PANEL PCR
C. difficile Tox A/B, PCR: NEGATIVE
CAMPYLOBACTER, PCR: NEGATIVE
CRYPTOSPORIDIUM, PCR: NEGATIVE
E coli (ETEC) LT/ST PCR: NEGATIVE
E coli (STEC) stx1/stx2, PCR: NEGATIVE
E coli 0157, PCR: NEGATIVE
Giardia lamblia, PCR: NEGATIVE
NOROVIRUS, PCR: NEGATIVE
Rotavirus A, PCR: NEGATIVE
Salmonella, PCR: NEGATIVE
Shigella, PCR: NEGATIVE

## 2014-05-15 ENCOUNTER — Encounter: Payer: Self-pay | Admitting: Sports Medicine

## 2014-05-15 ENCOUNTER — Ambulatory Visit (INDEPENDENT_AMBULATORY_CARE_PROVIDER_SITE_OTHER): Payer: 59 | Admitting: Sports Medicine

## 2014-05-15 VITALS — BP 102/66 | HR 75 | Ht 64.0 in | Wt 209.0 lb

## 2014-05-15 DIAGNOSIS — D234 Other benign neoplasm of skin of scalp and neck: Secondary | ICD-10-CM

## 2014-05-15 MED ORDER — HYDROCODONE-ACETAMINOPHEN 5-325 MG PO TABS: 1.0000 | ORAL_TABLET | Freq: Three times a day (TID) | ORAL | Status: DC | PRN

## 2014-05-15 NOTE — Assessment & Plan Note (Addendum)
Removal of #5 sebaceous cysts from the scalp with primary closure. Return in one week for suture removal Vicodin for pain.

## 2014-05-15 NOTE — Progress Notes (Signed)
  Subjective:    CC: Masses on scalp  HPI: [name] returns, we have done several dermoid cyst excisions previously on her scalp, she comes intermittently and we take a few at a time. She has noted a few new lesions on the right side of her scalp as well as the top and would like them checked out. Symptoms are moderate, persistent without radiation.  Past medical history, Surgical history, Family history not pertinant except as noted below, Social history, Allergies, and medications have been entered into the medical record, reviewed, and no changes needed.   Review of Systems: No fevers, chills, night sweats, weight loss, chest pain, or shortness of breath.   Objective:    General: Well Developed, well nourished, and in no acute distress.  Neuro: Alert and oriented x3, extra-ocular muscles intact, sensation grossly intact.  HEENT: Normocephalic, atraumatic, pupils equal round reactive to light, neck supple, no masses, no lymphadenopathy, thyroid nonpalpable. There are 3 masses on the right side and top of her scalp, the largest of which is approximately 3 cm across, the smaller ones are approximately 2.2 cm across. Skin: Warm and dry, no rashes. Cardiac: Regular rate and rhythm, no murmurs rubs or gallops, no lower extremity edema.  Respiratory: Clear to auscultation bilaterally. Not using accessory muscles, speaking in full sentences.  Procedure: Excision of 5 scalp sebaceous cysts, all from 2.1-3 centimeters Risks, benefits, and alternatives explained and consent obtained. Time out conducted. Surface prepped with chlorhexidine. A total of 10cc lidocaine with epinephine infiltrated in a field block around each one of the sebaceous cysts. Adequate anesthesia ensured. Area prepped and draped in a sterile fashion. Incision made with #15 blade. Using blunt dissection and sharp dissection, each of the sebaceous cysts were removed en bloc. Into the incisions I noted that there were 2 separate  cysts, so that a total of 5 sebaceous cysts were removed. A single 4-0 Ethilon horizontal mattress suture was placed to close each of the 3 incisions. Hemostasis achieved. Patient stable and head strapped with compressive dressing.  Impression and Recommendations:    I spent 40 minutes with this patient, greater than 50% was face-to-face time with counseling regarding the above diagnoses.

## 2014-05-22 ENCOUNTER — Ambulatory Visit (INDEPENDENT_AMBULATORY_CARE_PROVIDER_SITE_OTHER): Payer: 59 | Admitting: Sports Medicine

## 2014-05-22 ENCOUNTER — Encounter: Payer: Self-pay | Admitting: Sports Medicine

## 2014-05-22 VITALS — BP 106/70 | HR 88 | Wt 213.0 lb

## 2014-05-22 DIAGNOSIS — D234 Other benign neoplasm of skin of scalp and neck: Secondary | ICD-10-CM

## 2014-05-22 NOTE — Assessment & Plan Note (Signed)
Clean, dry, intact, incisions are well healed, sutures removed today.

## 2014-05-22 NOTE — Progress Notes (Signed)
  Subjective: Approximately one week from surgical removal of 5 sebaceous cysts from the scalp, doing extremely well.   Objective: General: Well-developed, well-nourished, and in no acute distress. Scalp: Incisions are clean, dry, intact and well-healed, all 3 Prolene sutures were removed today.  Assessment/plan:

## 2014-07-16 ENCOUNTER — Other Ambulatory Visit: Payer: Self-pay | Admitting: Family Medicine

## 2014-08-09 ENCOUNTER — Encounter: Payer: Self-pay | Admitting: Family Medicine

## 2014-08-11 ENCOUNTER — Other Ambulatory Visit: Payer: Self-pay | Admitting: Family Medicine

## 2014-08-11 DIAGNOSIS — G43809 Other migraine, not intractable, without status migrainosus: Secondary | ICD-10-CM

## 2014-08-11 NOTE — Telephone Encounter (Signed)
Ok to place referral.

## 2014-08-20 ENCOUNTER — Ambulatory Visit (INDEPENDENT_AMBULATORY_CARE_PROVIDER_SITE_OTHER): Payer: 59 | Admitting: Family

## 2014-08-20 ENCOUNTER — Encounter: Payer: Self-pay | Admitting: Family

## 2014-08-20 VITALS — BP 118/78 | HR 84 | Temp 98.1°F | Resp 16 | Ht 64.0 in | Wt 223.8 lb

## 2014-08-20 DIAGNOSIS — J0100 Acute maxillary sinusitis, unspecified: Secondary | ICD-10-CM

## 2014-08-20 DIAGNOSIS — R635 Abnormal weight gain: Secondary | ICD-10-CM | POA: Diagnosis not present

## 2014-08-20 LAB — TSH: TSH: 4.21 u[IU]/mL (ref 0.35–4.50)

## 2014-08-20 MED ORDER — AMOXICILLIN-POT CLAVULANATE 875-125 MG PO TABS: 1.0000 | ORAL_TABLET | Freq: Two times a day (BID) | ORAL | Status: DC

## 2014-08-20 NOTE — Progress Notes (Signed)
Pre visit review using our clinic review tool, if applicable. No additional management support is needed unless otherwise documented below in the visit note. 

## 2014-08-20 NOTE — Patient Instructions (Signed)
Please complete lab work prior to leaving. Call if sinus symptoms worsen or do not improve.

## 2014-08-20 NOTE — Progress Notes (Signed)
Subjective:    Patient ID: Michelle Moody, female    DOB: 1985/12/31, 29 y.o.   MRN: [id number]  HPI  Ms. Michelle Moody is a 29 yr old female who presents today to discuss sinus infection. Reports pain /pressure in her face. Started 10 days ago.  No improvement with sudafed or sinus max.  Tried advil cold/sinus which helped "tremendously."  Denies fever. No fever.  + maxillary tenderness.    LMP a few weeks ago.    Has gained 20 pounds in 6-8 weeks.    Review of Systems See HPI   Past Medical History  Diagnosis Date  . Hyperlipidemia   . Migraines   . History of dental surgery     2007  . Gallstones     02/2012  . Anxiety     2011  . Obese   . Anemia 07/06/2013  . IBS (irritable bowel syndrome)   . Elevated blood pressure     History   Social History  . Marital Status: Married    Spouse Name: N/A  . Number of Children: 2  . Years of Education: N/A   Occupational History  . Not on file.   Social History Main Topics  . Smoking status: Never Smoker   . Smokeless tobacco: Never Used  . Alcohol Use: No  . Drug Use: No  . Sexual Activity: Yes    Birth Control/ Protection: Condom     Comment: lives with husband and kids, works for [employer]   Other Topics Concern  . Not on file   Social History Narrative    Past Surgical History  Procedure Laterality Date  . Dental surgery      Family History  Problem Relation Age of Onset  . Cancer Father 38    prostate  . Hyperlipidemia Father   . Hypertension Father   . Alzheimer's disease Father   . Dementia Father   . Cancer Maternal Grandmother     ovarian  . Alcohol abuse Maternal Grandfather   . Cancer Maternal Grandfather     lung- smoker  . Stroke Paternal Grandmother   . Alzheimer's disease Paternal Grandmother   . Dementia Paternal Grandmother   . Heart disease Paternal Grandfather   . Hyperlipidemia Paternal Grandfather   . Hypertension Paternal Grandfather   . Thyroid disease Mother   . Colon polyps  Mother   . Hyperlipidemia Brother   . Cancer Paternal Aunt 48    colon  . Crohn's disease Paternal Uncle     Allergies  Allergen Reactions  . Clindamycin/Lincomycin Nausea And Vomiting  . Latex Rash    Current Outpatient Prescriptions on File Prior to Visit  Medication Sig Dispense Refill  . cetirizine (ZYRTEC) 10 MG tablet Take 10 mg by mouth daily.    . cholecalciferol (VITAMIN D) 1000 UNITS tablet Take 1,000 Units by mouth daily.    Marland Kitchen eletriptan (RELPAX) 40 MG tablet Take 1 tablet (40 mg total) by mouth as needed for migraine or headache. May repeat in 2 hours if headache persists or recurs. 10 tablet 1  . Ferrous Sulfate (IRON) 325 (65 FE) MG TABS Take 1 tablet by mouth daily.    Marland Kitchen HYDROcodone-acetaminophen (NORCO/VICODIN) 5-325 MG per tablet Take 1 tablet by mouth every 8 (eight) hours as needed for moderate pain. 40 tablet 0  . Magnesium 400 MG TABS Take 1 tablet by mouth daily.    . Prenatal Vit-Fe Fumarate-FA (PRENATAL MULTIVITAMIN) TABS tablet Take 1 tablet by mouth  daily at 12 noon.    . promethazine (PHENERGAN) 25 MG tablet Take 1 tablet (25 mg total) by mouth every 6 (six) hours as needed for nausea. 15 tablet 1  . propranolol (INDERAL) 10 MG tablet TAKE 1 TABLET BY MOUTH 2 TIMES A DAY 60 tablet 1   No current facility-administered medications on file prior to visit.    BP 118/78 mmHg  Pulse 84  Temp(Src) 98.1 F (36.7 C) (Oral)  Resp 16  Ht 5\' 4"  (1.626 m)  Wt 223 lb 12.8 oz (101.515 kg)  BMI 38.40 kg/m2  SpO2 97%  LMP 08/01/2014       Objective:   Physical Exam  Constitutional: She appears well-developed and well-nourished.  HENT:  Right Ear: Tympanic membrane and ear canal normal.  Left Ear: Tympanic membrane and ear canal normal.  Nose: Right sinus exhibits maxillary sinus tenderness. Right sinus exhibits no frontal sinus tenderness. Left sinus exhibits maxillary sinus tenderness. Left sinus exhibits no frontal sinus tenderness.  Mouth/Throat: No  oropharyngeal exudate, posterior oropharyngeal edema or posterior oropharyngeal erythema.  Cardiovascular: Normal rate, regular rhythm and normal heart sounds.   No murmur heard. Pulmonary/Chest: Effort normal and breath sounds normal. No respiratory distress. She has no wheezes.  Neurological: She is alert.  Psychiatric: She has a normal mood and affect. Her behavior is normal. Judgment and thought content normal.          Assessment & Plan:

## 2014-08-21 DIAGNOSIS — R635 Abnormal weight gain: Secondary | ICD-10-CM | POA: Insufficient documentation

## 2014-08-21 DIAGNOSIS — J0100 Acute maxillary sinusitis, unspecified: Secondary | ICD-10-CM | POA: Insufficient documentation

## 2014-08-21 NOTE — Assessment & Plan Note (Signed)
Obtain TSH to rule out hypothyroid as a contributing factor in her recent weight gain.

## 2014-08-21 NOTE — Assessment & Plan Note (Signed)
Symptoms most consistent with sinusitis. Will rx with augmentin.  I do think that she may also have been experiencing some migraines and I have advised her to keep her upcoming apt with neurology and continue beta blocker.

## 2014-09-04 ENCOUNTER — Ambulatory Visit (INDEPENDENT_AMBULATORY_CARE_PROVIDER_SITE_OTHER): Payer: 59 | Admitting: Otolaryngology

## 2014-09-04 DIAGNOSIS — J343 Hypertrophy of nasal turbinates: Secondary | ICD-10-CM

## 2014-09-04 DIAGNOSIS — J342 Deviated nasal septum: Secondary | ICD-10-CM

## 2014-09-04 DIAGNOSIS — J310 Chronic rhinitis: Secondary | ICD-10-CM | POA: Diagnosis not present

## 2014-09-16 ENCOUNTER — Encounter: Payer: Self-pay | Admitting: *Deleted

## 2014-09-16 ENCOUNTER — Encounter: Payer: 59 | Attending: Family Medicine | Admitting: *Deleted

## 2014-09-16 DIAGNOSIS — Z6838 Body mass index (BMI) 38.0-38.9, adult: Secondary | ICD-10-CM | POA: Diagnosis not present

## 2014-09-16 DIAGNOSIS — E669 Obesity, unspecified: Secondary | ICD-10-CM | POA: Insufficient documentation

## 2014-09-16 DIAGNOSIS — Z713 Dietary counseling and surveillance: Secondary | ICD-10-CM | POA: Diagnosis not present

## 2014-09-16 NOTE — Progress Notes (Signed)
Medical Nutrition Therapy:  Appt start time: 0900 end time:  1000.   Assessment:  Primary concerns today: Michelle Moody is here for nutrition counseling pertaining to a desire to lose weight.  She became tearful at times during her visit.  She has tried multiple times to lose weight and is frustrated and discouraged.  heaviest weight outside of pregnancy: currently: 230 lb Lowest weight as adult: 145 lb Most consistent weight: 180 lb Would like to weigh: 165 lb.   Her brother is heavy; dad is skinny; mom has been yo-yo Biomedical scientist and has gained and lost.  Michelle Moody states she has tried various things to lose weight: Michelle Moody, history of AN (didn't get treatment); done so many diets to even count.    Michelle Moody does the grocery shopping and cooking for her household.  She does not fry foods an and usually baked or grills.  Rarely eats out.  When at home she eats at the kitchen as a family.  She eats without distractions.  She thinks she is a fast eater.  She tries to finish her meal before she feeds her 81 month old.   Preferred Learning Style:  No preference indicated   Learning Readiness:   Ready   MEDICATIONS: see list   DIETARY INTAKE:  Usual eating pattern includes 2-3 meals and 0 snacks per day.  Avoided foods include low carb, dairy (causes GI distress).    24-hr recall:  B ( AM): granola bar with peanut butter  Snk ( AM): none  L ( PM): taco salad: Michelle Moody with taco seasoning, lettuce, cheese, pico.  Eats at her desk Snk ( PM): none D ( PM): pizza Snk ( PM): none Beverages: water, coffee, gatorade  Usual physical activity: not much.  Has more time on weekends to walk or do elliptical or play soccer with her 30 year old  Estimated energy needs: 2000-2400 calories   Nutritional Diagnosis:  NB-1.1 Food and nutrition-related knowledge deficit As related to proper balance of fats, carbohydrates, and proteins.  As evidenced by restriction and diet approach.    Intervention:  Nutrition  counseling provided.  Encouraged patient to reject traditional diet mentality of "good" vs "bad" foods.  There are no good and bad foods, but rather food is fuel that we needs for our bodies.  When we don't get enough fuel, our bodies suffer the metabolic consequences.  Encouraged patient to eat whatever foods will satisfy them, regardless of their nutritional value.  We will discuss nutritional values of foods at a subsequent appointment.  Encouraged patient to honor their body's internal hunger and fullness cues.  Throughout the day, check in mentally and rate hunger.  Try not to eat when ravenous, but instead when slightly hungry.  Then choose food(s) that will be satisfying regardless of nutritional content.  Sit down to enjoy those foods.  Minimize distractions: turn off tv, put away books, work, Michelle Moody.  Make the meal last at least 20 minutes in order to give time to experience and register satiety.  Stop eating when full regardless of how much food is left on the plate.  Get more if still hungry.  The key is to honor fullness so throughout the meal, rate fullness factor and stop when comfortably full, but not stuffed.  Reminded patient that they can have any food they want, whenever they want, and however much they want.  Eventually the novelty will wear out and each food will be equal in terms of its emotional appeal.  This  will be a learning process and some days more food will be eaten, some days less.  The key is to honor hunger and fullness without any feelings of guilt.  Pay attention to what the internal cues are, rather than any external factors. Discused HAEs principles  Goals:  Put away the scale.  Refrain from weighing self between nutrition appointments Reject diet mentality- there are no good or bad foods Listen to internal hunger cues and honor those cues; don't wait until you're ravenous to eat Choose the food(s) you want Enjoy those foods.  Try to make meal last 20 minutes Stop  eating when full.  Honor fullness cues too Do these things without any guilt or regret Put your needs higher up- get help with feeding the kids Use MyPlate as a tool, not a rule Plan ahead with meals and snack if you need to   Teaching Method Utilized:  Visual Auditory   Barriers to learning/adherence to lifestyle change: planning  Demonstrated degree of understanding via:  Teach Back   Monitoring/Evaluation:  Dietary intake, exercise, and body weight prn.

## 2014-10-14 ENCOUNTER — Encounter: Payer: Self-pay | Admitting: Neurology

## 2014-10-14 ENCOUNTER — Ambulatory Visit (INDEPENDENT_AMBULATORY_CARE_PROVIDER_SITE_OTHER): Payer: 59 | Admitting: Neurology

## 2014-10-14 VITALS — BP 122/76 | HR 64 | Temp 98.6°F | Ht 64.0 in | Wt 231.0 lb

## 2014-10-14 DIAGNOSIS — G44229 Chronic tension-type headache, not intractable: Secondary | ICD-10-CM | POA: Diagnosis not present

## 2014-10-14 DIAGNOSIS — G43019 Migraine without aura, intractable, without status migrainosus: Secondary | ICD-10-CM

## 2014-10-14 DIAGNOSIS — M542 Cervicalgia: Secondary | ICD-10-CM

## 2014-10-14 MED ORDER — PROPRANOLOL HCL 40 MG PO TABS: 40.0000 mg | ORAL_TABLET | Freq: Two times a day (BID) | ORAL | Status: DC

## 2014-10-14 MED ORDER — ELETRIPTAN HYDROBROMIDE 40 MG PO TABS: 40.0000 mg | ORAL_TABLET | ORAL | Status: DC | PRN

## 2014-10-14 MED ORDER — NAPROXEN SODIUM 550 MG PO TABS: 550.0000 mg | ORAL_TABLET | Freq: Two times a day (BID) | ORAL | Status: DC | PRN
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
## 2014-10-14 NOTE — Patient Instructions (Signed)
Migraine Recommendations: 1.  Start propranolol 40mg  twice daily.  Call in 4 weeks with update and we can adjust dose if needed. 2.  Take relpax 40mg  WITH naproxen 550mg  at earliest onset of headache.  May repeat Relpax (without naproxen) once in 2 hours if needed.  Do not exceed two tablets in 24 hours. 3.  Limit use of pain relievers to no more than 2 days out of the week.  These medications include acetaminophen, ibuprofen, triptans and narcotics.  This will help reduce risk of rebound headaches. 4.  Be aware of common food triggers such as processed sweets, processed foods with nitrites (such as deli meat, hot dogs, sausages), foods with MSG, alcohol (such as wine), chocolate, certain cheeses, certain fruits (dried fruits, some citrus fruit), vinegar, diet soda. 4.  Avoid caffeine 5.  Routine exercise 6.  Proper sleep hygiene 7.  Stay adequately hydrated with water 8.  Keep a headache diary. 9.  Maintain proper stress management. 10.  Do not skip meals. 11.  Consider supplements:  Magnesium oxide 400mg  to 600mg  daily, riboflavin 400mg , Coenzyme Q 10 100mg  three times daily 12.  CALL IN 4 WEEKS WITH UPDATE.  Follow up in 3 months.

## 2014-11-23 ENCOUNTER — Encounter: Payer: Self-pay | Admitting: Sports Medicine

## 2014-11-24 ENCOUNTER — Other Ambulatory Visit: Payer: Self-pay

## 2014-11-24 ENCOUNTER — Encounter: Payer: Self-pay | Admitting: Internal Medicine

## 2014-11-24 DIAGNOSIS — R197 Diarrhea, unspecified: Secondary | ICD-10-CM

## 2014-11-25 ENCOUNTER — Other Ambulatory Visit: Payer: 59

## 2014-11-25 ENCOUNTER — Ambulatory Visit (INDEPENDENT_AMBULATORY_CARE_PROVIDER_SITE_OTHER): Payer: 59 | Admitting: Sports Medicine

## 2014-11-25 DIAGNOSIS — D234 Other benign neoplasm of skin of scalp and neck: Secondary | ICD-10-CM

## 2014-11-25 DIAGNOSIS — R197 Diarrhea, unspecified: Secondary | ICD-10-CM

## 2014-11-25 MED ORDER — HYDROCODONE-ACETAMINOPHEN 5-325 MG PO TABS: 1.0000 | ORAL_TABLET | Freq: Three times a day (TID) | ORAL | Status: DC | PRN

## 2014-11-25 NOTE — Assessment & Plan Note (Signed)
Sebaceous cyst excision 2. Hydrocodone for pain. Return in one week for suture removal.

## 2014-11-25 NOTE — Progress Notes (Signed)
  Subjective:    CC: Scalp cysts  HPI: This pleasant 29 year old female returns, we've removed approximately 20 cysts from her scalp, she has a couple more that she would like me to surgically remove today.  Past medical history, Surgical history, Family history not pertinant except as noted below, Social history, Allergies, and medications have been entered into the medical record, reviewed, and no changes needed.   Review of Systems: No fevers, chills, night sweats, weight loss, chest pain, or shortness of breath.   Objective:    General: Well Developed, well nourished, and in no acute distress.  Neuro: Alert and oriented x3, extra-ocular muscles intact, sensation grossly intact.  HEENT: Normocephalic, atraumatic, pupils equal round reactive to light, neck supple, no masses, no lymphadenopathy, thyroid nonpalpable.  Skin: Warm and dry, no rashes. Cardiac: Regular rate and rhythm, no murmurs rubs or gallops, no lower extremity edema.  Respiratory: Clear to auscultation bilaterally. Not using accessory muscles, speaking in full sentences.  Procedure: Excision of two scalp sebaceous cysts, 3 cm, 3cm. Risks, benefits, and alternatives explained and consent obtained. Time out conducted. Surface prepped with chlorhexidine. A total of 5cc lidocaine with epinephine infiltrated in a field block around each one of the sebaceous cysts. Adequate anesthesia ensured. Area prepped and draped in a sterile fashion. Incision made with #15 blade. Using blunt dissection and sharp dissection, each of the sebaceous cysts were removed en bloc. A single 4-0 Ethilon horizontal mattress suture was placed to close each of the 2 incisions. Hemostasis achieved. Patient stable and head strapped with compressive dressing.  Impression and Recommendations:

## 2014-11-27 LAB — GASTROINTESTINAL PATHOGEN PANEL PCR
C. DIFFICILE TOX A/B, PCR: NEGATIVE
CAMPYLOBACTER, PCR: NEGATIVE
CRYPTOSPORIDIUM, PCR: NEGATIVE
E COLI (ETEC) LT/ST, PCR: NEGATIVE
E COLI 0157, PCR: NEGATIVE
E coli (STEC) stx1/stx2, PCR: NEGATIVE
GIARDIA LAMBLIA, PCR: NEGATIVE
Norovirus, PCR: NEGATIVE
Rotavirus A, PCR: NEGATIVE
Salmonella, PCR: NEGATIVE
Shigella, PCR: NEGATIVE

## 2014-12-02 ENCOUNTER — Ambulatory Visit (AMBULATORY_SURGERY_CENTER): Payer: Self-pay

## 2014-12-02 VITALS — Ht 64.0 in | Wt 232.2 lb

## 2014-12-02 DIAGNOSIS — R197 Diarrhea, unspecified: Secondary | ICD-10-CM

## 2014-12-02 MED ORDER — NA SULFATE-K SULFATE-MG SULF 17.5-3.13-1.6 GM/177ML PO SOLN: ORAL | Status: DC

## 2014-12-02 NOTE — Progress Notes (Signed)
Per pt, no allergies  to egg products." SOY" products causes diarrhea. Pt not taking any weight loss meds or using  O2 at home.

## 2014-12-04 ENCOUNTER — Ambulatory Visit (AMBULATORY_SURGERY_CENTER): Payer: 59 | Admitting: Internal Medicine

## 2014-12-04 ENCOUNTER — Encounter: Payer: Self-pay | Admitting: Internal Medicine

## 2014-12-04 VITALS — BP 106/59 | HR 64 | Temp 98.4°F | Resp 23 | Ht 64.0 in | Wt 232.0 lb

## 2014-12-04 DIAGNOSIS — R197 Diarrhea, unspecified: Secondary | ICD-10-CM

## 2014-12-04 MED ORDER — SODIUM CHLORIDE 0.9 % IV SOLN: 500.0000 mL | INTRAVENOUS | Status: DC

## 2014-12-04 NOTE — Op Note (Signed)
[facility]  [address] [place], [address]   COLONOSCOPY PROCEDURE REPORT  PATIENT: Michelle, Moody  MR#: [id number] BIRTHDATE: 04-17-1985 , 29  yrs. old GENDER: female ENDOSCOPIST: Eustace Quail, MD REFERRED [id number] Charlett Blake, M.D. PROCEDURE DATE:  12/04/2014 PROCEDURE:   Colonoscopy, diagnostic and Colonoscopy with biopsy First Screening Colonoscopy - Avg.  risk and is 50 yrs.  old or older - No.  Prior Negative Screening - Now for repeat screening. N/A  History of Adenoma - Now for follow-up colonoscopy & has been > or = to 3 yrs.  N/A  Polyps removed today? No Recommend repeat exam, <10 yrs? No ASA CLASS:   Class II INDICATIONS:Clinically significant diarrhea of unexplained origin and Patient is not applicable for Colorectal Neoplasm Risk Assessment for this procedure. MEDICATIONS: Monitored anesthesia care and Propofol 250 mg IV  DESCRIPTION OF PROCEDURE:   After the risks benefits and alternatives of the procedure were thoroughly explained, informed consent was obtained.  The digital rectal exam revealed no abnormalities of the rectum.   The [id number]  endoscope was introduced through the anus and advanced to the cecum, which was identified by both the appendix and ileocecal valve. No adverse events experienced.   The quality of the prep was excellent. (Suprep was used)  The instrument was then slowly withdrawn as the colon was fully examined. Estimated blood loss is zero unless otherwise noted in this procedure report.   COLON FINDINGS: The examined terminal ileum appeared to be normal. There was a rare diverticulum  noted in the right colon.   The examination was otherwise normal. Random colon biopsies (8) were taken..  Retroflexed views revealed no abnormalities. The time to cecum = 3.3 Withdrawal time = 7.5   The scope was withdrawn and the procedure completed. COMPLICATIONS: There were no immediate complications.  ENDOSCOPIC  IMPRESSION: 1.   The examined terminal ileum appeared normal 2.   Mild diverticulosis noted in the right colon 3.   The examination was otherwise normal  RECOMMENDATIONS: 1. Await biopsy results 2. Try daily fiber supplement such as Metamucil or Citrucel one to 2 tablespoons 3. Schedule office follow-up with Dr. Henrene Pastor in 4-6 weeks  eSigned:  Eustace Quail, MD 12/04/2014 8:38 AM   cc: The Patient and Willette Alma, MD

## 2014-12-04 NOTE — Progress Notes (Signed)
Called to room to assist during endoscopic procedure.  Patient ID and intended procedure confirmed with present staff. Received instructions for my participation in the procedure from the performing physician.  

## 2014-12-04 NOTE — Patient Instructions (Signed)
YOU HAD AN ENDOSCOPIC PROCEDURE TODAY AT [facility]:   Refer to the procedure report that was given to you for any specific questions about what was found during the examination.  If the procedure report does not answer your questions, please call your gastroenterologist to clarify.  If you requested that your care partner not be given the details of your procedure findings, then the procedure report has been included in a sealed envelope for you to review at your convenience later.  YOU SHOULD EXPECT: Some feelings of bloating in the abdomen. Passage of more gas than usual.  Walking can help get rid of the air that was put into your GI tract during the procedure and reduce the bloating. If you had a lower endoscopy (such as a colonoscopy or flexible sigmoidoscopy) you may notice spotting of blood in your stool or on the toilet paper. If you underwent a bowel prep for your procedure, you may not have a normal bowel movement for a few days.  Please Note:  You might notice some irritation and congestion in your nose or some drainage.  This is from the oxygen used during your procedure.  There is no need for concern and it should clear up in a day or so.  SYMPTOMS TO REPORT IMMEDIATELY:   Following lower endoscopy (colonoscopy or flexible sigmoidoscopy):  Excessive amounts of blood in the stool  Significant tenderness or worsening of abdominal pains  Swelling of the abdomen that is new, acute  Fever of 100F or higher    For urgent or emergent issues, a gastroenterologist can be reached at any hour by calling [phone].   DIET: Your first meal following the procedure should be a small meal and then it is ok to progress to your normal diet. Heavy or fried foods are harder to digest and may make you feel nauseous or bloated.  Likewise, meals heavy in dairy and vegetables can increase bloating.  Drink plenty of fluids but you should avoid alcoholic beverages for 24  hours.  Diverticulosis information given. Try fiber supplement like citrucel or metamucil 2 tablespoons daily. Schedule office visit with Dr. Henrene Pastor in 4-6 weeks.  ACTIVITY:  You should plan to take it easy for the rest of today and you should NOT DRIVE or use heavy machinery until tomorrow (because of the sedation medicines used during the test).    FOLLOW UP: Our staff will call the number listed on your records the next business day following your procedure to check on you and address any questions or concerns that you may have regarding the information given to you following your procedure. If we do not reach you, we will leave a message.  However, if you are feeling well and you are not experiencing any problems, there is no need to return our call.  We will assume that you have returned to your regular daily activities without incident.  If any biopsies were taken you will be contacted by phone or by letter within the next 1-3 weeks.  Please call us at [phone] if you have not heard about the biopsies in 3 weeks.    SIGNATURES/CONFIDENTIALITY: You and/or your care partner have signed paperwork which will be entered into your electronic medical record.  These signatures attest to the fact that that the information above on your After Visit Summary has been reviewed and is understood.  Full responsibility of the confidentiality of this discharge information lies with you and/or your care-partner.

## 2014-12-04 NOTE — Progress Notes (Signed)
A/ox3 pleased with MAC, report to Jane RN 

## 2014-12-05 ENCOUNTER — Telehealth: Payer: Self-pay | Admitting: Internal Medicine

## 2014-12-05 NOTE — Telephone Encounter (Signed)
°  Follow up Call-  Call back number 12/04/2014  Post procedure Call Back phone  # [phone]  Permission to leave phone message Yes     Patient questions:  Do you have a fever, pain , or abdominal swelling? No. Pain Score  0 *  Have you tolerated food without any problems? Yes.    Have you been able to return to your normal activities? Yes.    Do you have any questions about your discharge instructions: Diet   No. Medications  No. Follow up visit  No.  Do you have questions or concerns about your Care? No.  Actions: * If pain score is 4 or above: No action needed, pain <4.

## 2014-12-09 ENCOUNTER — Encounter: Payer: Self-pay | Admitting: Internal Medicine

## 2015-01-15 ENCOUNTER — Encounter: Payer: Self-pay | Admitting: Internal Medicine

## 2015-01-15 ENCOUNTER — Ambulatory Visit (INDEPENDENT_AMBULATORY_CARE_PROVIDER_SITE_OTHER): Payer: 59 | Admitting: Internal Medicine

## 2015-01-15 ENCOUNTER — Encounter: Payer: Self-pay | Admitting: Neurology

## 2015-01-15 ENCOUNTER — Telehealth: Payer: Self-pay | Admitting: Neurology

## 2015-01-15 VITALS — BP 108/72 | HR 72 | Ht 63.5 in | Wt 237.0 lb

## 2015-01-15 DIAGNOSIS — R197 Diarrhea, unspecified: Secondary | ICD-10-CM | POA: Diagnosis not present

## 2015-01-15 DIAGNOSIS — K219 Gastro-esophageal reflux disease without esophagitis: Secondary | ICD-10-CM | POA: Diagnosis not present

## 2015-01-15 NOTE — Progress Notes (Signed)
HISTORY OF PRESENT ILLNESS:  Michelle Moody is a 29 y.o. female with hypertension, hyperlipidemia, obesity, and chronic migraine headaches who was initially evaluated 03/25/2014 for abnormal bowel habits and diarrhea. See that dictation for details. She does have a history of chronic abdominal complaints as outlined. Question of ileitis elsewhere. 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 Intolerant to metronidazole. Subsequently treated with Tinidazole. Seen in follow-up April 2016 seemingly improved. Subsequent contacted the office complaining of severe issues with diarrhea. Thus, scheduled for a complete colonoscopy with ileoscopy 12/04/2014. The ileum was normal. The colonic mucosa was normal grossly and microscopically on biopsy. There was incidental diverticulosis. It was recommended that she initiate fiber. As well, discontinue magnesium supplements. Since that time she does report improvement in her symptoms. She describes 3 episodes of loose stools which lasts for one day only. She is concerned that propranolol may have caused her diarrhea. I'm not certain of that. She does have a new complaint of worsening indigestion and heartburn. She has not had that since pregnancy. However, she has gained 25 pounds over the past 6 months.  REVIEW OF SYSTEMS:  All non-GI ROS negative except for visual change, itching, sore throat  Past Medical History  Diagnosis Date  . Hyperlipidemia   . Migraines   . History of dental surgery     2007  . Gallstones     02/2012  . Anxiety     2011  . Obese   . Anemia 07/06/2013  . IBS (irritable bowel syndrome)   . Elevated blood pressure   . Post-operative nausea and vomiting   . Rectal bleeding   . Diverticulosis     Past Surgical History  Procedure Laterality Date  . Dental surgery    . Cyst excision      14 removed from scalp    Social History Michelle Moody  reports that she has never smoked. She has never used smokeless tobacco. She reports that she does not  drink alcohol or use illicit drugs.  family history includes Alcohol abuse in her maternal grandfather; Alzheimer's disease in her father and paternal grandmother; Cancer in her maternal grandfather and maternal grandmother; Cancer (age of onset: 25) in her paternal aunt; Cancer (age of onset: 59) in her father; Colon polyps in her mother; Crohn's disease in her paternal uncle; Dementia in her father and paternal grandmother; Heart disease in her paternal grandfather; Hyperlipidemia in her brother, father, and paternal grandfather; Hypertension in her father and paternal grandfather; Stroke in her paternal grandmother; Thyroid disease in her mother.  Allergies  Allergen Reactions  . Soy Allergy     Causes diarrhea  . Clindamycin/Lincomycin Nausea And Vomiting  . Latex Rash       PHYSICAL EXAMINATION: Vital signs: BP 108/72 mmHg  Pulse 72  Ht 5' 3.5" (1.613 m)  Wt 237 lb (107.502 kg)  BMI 41.32 kg/m2  LMP 01/08/2015  Breastfeeding? No General: Pleasant, Well-developed, well-nourished, no acute distress. Obese HEENT: Sclerae are anicteric, conjunctiva pink. Oral mucosa intact Lungs: Clear Heart: Regular Abdomen: soft, obese, nontender, nondistended, no obvious ascites, no peritoneal signs, normal bowel sounds. No organomegaly. Extremities: No clubbing cyanosis or edema Psychiatric: alert and oriented x3. Cooperative   ASSESSMENT:  #1. Recent problems with diarrhea. Certainly had Giardia which is been treated without recurrence documented. 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 Fairly magnesium can cause diarrhea. Improved with fiber and magnesium withdrawal. Negative colonoscopy macroscopically and microscopically #2. GERD. Exacerbated. Likely exacerbated due to weight gain #  3. Obesity. Worsening  PLAN:  #1. Continue fiber supplementation #2. Reflux precautions with attention to weight loss. Discussed #3. Recommended Prilosec OTC once daily for a few weeks, down active  reflux symptoms. On demand use thereafter okay #4. Resume general medical care with PCP. GI follow-up as needed

## 2015-01-15 NOTE — Telephone Encounter (Signed)
Michelle Moody left message that she is experiencing more side effects with the medication Propranolol/Dawn CB# [phone]

## 2015-01-15 NOTE — Telephone Encounter (Signed)
Please see pt's mychart message.   "  Dr. Tomi Likens,        I am scheduled to see you next week. However, I want to begin coming off of the Propranolol asap. I do not believe this is a good long term solution for my migraines. While this reduced the amount of migraines I get by at least a third I have been noticing side effects to the medication. I had been keeping a diary of issues for a month or so. I have been experiencing blurred/changing vision, swelling from my knees to my feet, slight chest pains (not frequently, but enough to be un-nerving, nausea (no vomiting), diarrhea, unexplained weight gain, etc. I would like to tapper off the Propranolol and try the supplements you suggested at my first visit. Can you either call me, have a nurse call me, or let me know how to begin tapering off the Propranolol?   "

## 2015-01-15 NOTE — Patient Instructions (Signed)
Please follow up with Dr. Perry as needed 

## 2015-01-16 NOTE — Telephone Encounter (Signed)
See next note

## 2015-01-16 NOTE — Telephone Encounter (Signed)
Patient given instructions per Dr. Jaffe. 

## 2015-01-16 NOTE — Telephone Encounter (Signed)
She should currently be taking 40mg  twice daily of the propranolol.  She may just stop it (no taper) since it is a typical starting dose.  The supplements/vitamins to try are: magnesium oxide 400mg  to 600mg  daily, riboflavin 400mg , and coenzyme Q 10 100mg  three times daily

## 2015-01-21 ENCOUNTER — Encounter: Payer: Self-pay | Admitting: Neurology

## 2015-01-21 ENCOUNTER — Ambulatory Visit (INDEPENDENT_AMBULATORY_CARE_PROVIDER_SITE_OTHER): Payer: 59 | Admitting: Neurology

## 2015-01-21 ENCOUNTER — Other Ambulatory Visit (INDEPENDENT_AMBULATORY_CARE_PROVIDER_SITE_OTHER): Payer: 59

## 2015-01-21 VITALS — BP 122/74 | HR 106 | Ht 63.5 in | Wt 240.0 lb

## 2015-01-21 DIAGNOSIS — Z79899 Other long term (current) drug therapy: Secondary | ICD-10-CM

## 2015-01-21 DIAGNOSIS — G43009 Migraine without aura, not intractable, without status migrainosus: Secondary | ICD-10-CM | POA: Diagnosis not present

## 2015-01-21 LAB — HEPATIC FUNCTION PANEL
ALK PHOS: 65 U/L (ref 39–117)
ALT: 20 U/L (ref 0–35)
AST: 18 U/L (ref 0–37)
Albumin: 4.1 g/dL (ref 3.5–5.2)
BILIRUBIN DIRECT: 0.1 mg/dL (ref 0.0–0.3)
BILIRUBIN TOTAL: 0.3 mg/dL (ref 0.2–1.2)
Total Protein: 6.9 g/dL (ref 6.0–8.3)

## 2015-01-21 MED ORDER — SUMATRIPTAN-NAPROXEN SODIUM 85-500 MG PO TABS: ORAL_TABLET | ORAL | Status: DC

## 2015-01-21 MED FILL — TREXIMET 85-500 MG TABLET: 85-500 | 30 days supply | Qty: 9 | Fill #0
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
## 2015-01-21 NOTE — Patient Instructions (Signed)
1.  Take supplements magnesium, riboflavin, coenzyme q10 and butterbur.  Since butterbur may affect the liver, will check baseline liver function tests and repeat test in 3 months prior to follow up. 2.  Stop Relpax.  Instead will start Treximet.  Take one tablet at earliest onset of headache and may repeat once in 2 hours if needed.  Do not take naproxen with it because the medication already includes naproxen 3.  Call with questions or concerns 4.  Follow up in 3 months.

## 2015-01-22 ENCOUNTER — Telehealth: Payer: Self-pay

## 2015-01-22 NOTE — Telephone Encounter (Signed)
-----   Message from Pieter Partridge, DO sent at 01/22/2015  8:26 AM EST ----- LFT normal

## 2015-01-22 NOTE — Telephone Encounter (Signed)
Mychart message sent to patient.

## 2015-02-06 DIAGNOSIS — M546 Pain in thoracic spine: Secondary | ICD-10-CM | POA: Diagnosis not present

## 2015-02-06 DIAGNOSIS — M545 Low back pain: Secondary | ICD-10-CM | POA: Diagnosis not present

## 2015-02-06 DIAGNOSIS — M542 Cervicalgia: Secondary | ICD-10-CM | POA: Diagnosis not present

## 2015-02-06 DIAGNOSIS — M9902 Segmental and somatic dysfunction of thoracic region: Secondary | ICD-10-CM | POA: Diagnosis not present

## 2015-02-06 DIAGNOSIS — M9903 Segmental and somatic dysfunction of lumbar region: Secondary | ICD-10-CM | POA: Diagnosis not present

## 2015-02-06 DIAGNOSIS — M9901 Segmental and somatic dysfunction of cervical region: Secondary | ICD-10-CM | POA: Diagnosis not present

## 2015-02-13 DIAGNOSIS — M545 Low back pain: Secondary | ICD-10-CM | POA: Diagnosis not present

## 2015-02-13 DIAGNOSIS — M542 Cervicalgia: Secondary | ICD-10-CM | POA: Diagnosis not present

## 2015-02-13 DIAGNOSIS — M9903 Segmental and somatic dysfunction of lumbar region: Secondary | ICD-10-CM | POA: Diagnosis not present

## 2015-02-13 DIAGNOSIS — M546 Pain in thoracic spine: Secondary | ICD-10-CM | POA: Diagnosis not present

## 2015-02-13 DIAGNOSIS — M9901 Segmental and somatic dysfunction of cervical region: Secondary | ICD-10-CM | POA: Diagnosis not present

## 2015-02-13 DIAGNOSIS — M9902 Segmental and somatic dysfunction of thoracic region: Secondary | ICD-10-CM | POA: Diagnosis not present

## 2015-03-02 ENCOUNTER — Telehealth: Payer: Self-pay

## 2015-03-02 MED ORDER — PREDNISONE 10 MG PO TABS: ORAL_TABLET | ORAL | Status: DC

## 2015-03-02 MED FILL — predniSONE 10 MG TABS: 10 | 6 days supply | Qty: 21 | Fill #0

## 2015-03-02 NOTE — Telephone Encounter (Addendum)
On Thursday pt developed a migraine, tried treximet for the first time, she did take it with food. Made her incredibly sick to her stomach, however did relieve migraine. Migraine returned approx. 12 hours later. Pt did take another treximet, and again relieved headache, but returned in about 12 hours. Patient finally got rid of migraine for good last night. Woke this morning feeling well, went to work and after ~2 hours developed pain running up the back of her neck and into her temple on the R side. Pt states this is the side she always has her migraines on. Pt took 800 mg of ibfrophen, as this is not how her migraines normally present. Then developed a runny nose and eye on the right side, took some allergy mediation w/o help. Patient is very worried about this new pain. Would like to know if this is a reaction to the Treximet? Or progression of her migraine? What should she do? Please advise.

## 2015-03-02 NOTE — Telephone Encounter (Signed)
Per notes, it looks like she has had neck pain in the past.  Is there any new weakness, vision changes, droopy eye, numbness/tingling on the right side?  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  How bad is her headache?  We can offer her a course of prednisone taper over one week, if her pain is still severe.   Roque Schill K. Posey Pronto, DO

## 2015-03-02 NOTE — Telephone Encounter (Signed)
No other neurological symptoms. Patient rates pain level as an 8/10. RX for prednisone sent in to [facility].

## 2015-03-03 ENCOUNTER — Ambulatory Visit: Payer: Self-pay | Admitting: Family Medicine

## 2015-03-06 IMAGING — CR DG CERVICAL SPINE COMPLETE 4+V
5 series · 5 of 5 positions shown · non-contrast
Comparison: November 23, 2011

CLINICAL DATA: Neck pain radiating into right arm for several
months

EXAM:
CERVICAL SPINE  4+ VIEWS

[view not recorded (1 of 5)]
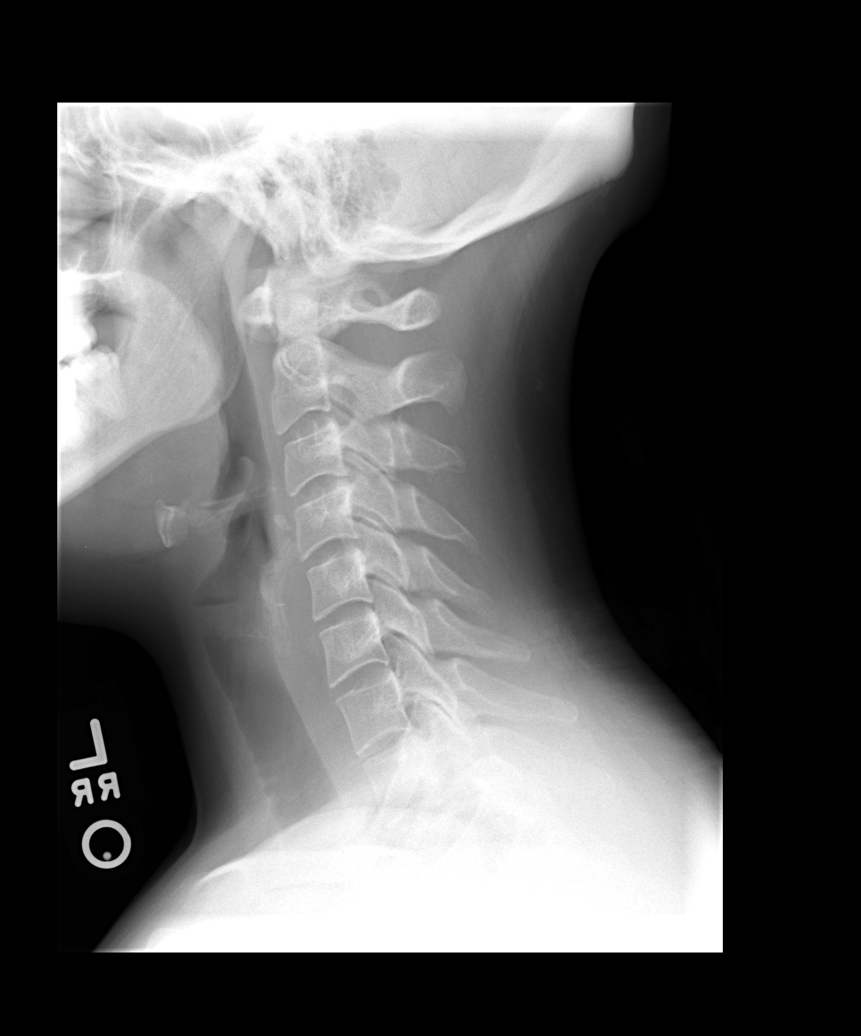

[view not recorded (2 of 5)]
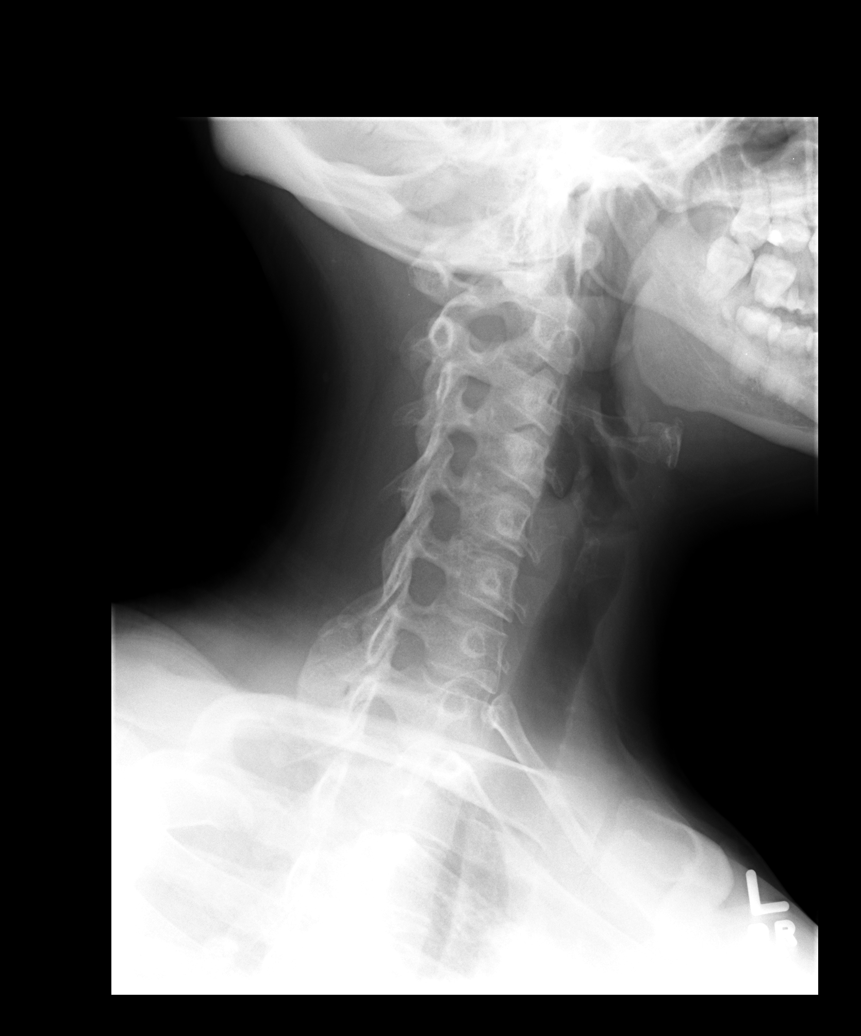

[view not recorded (3 of 5)]
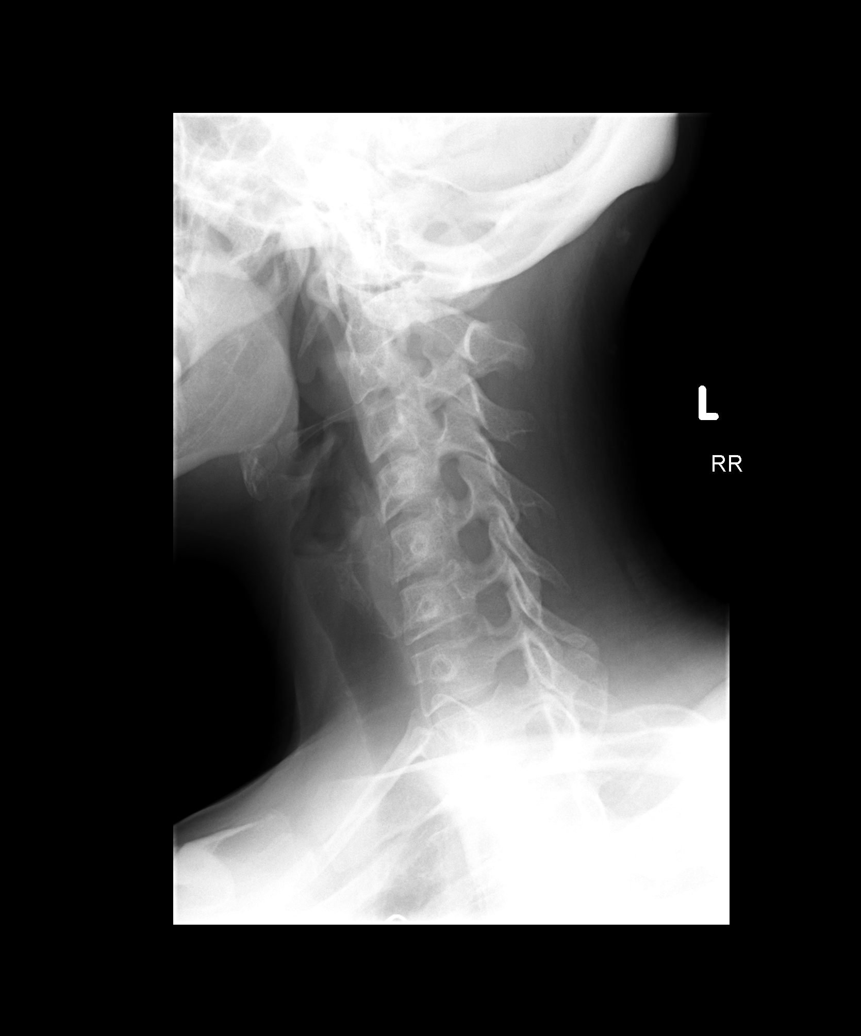

[view not recorded (4 of 5)]
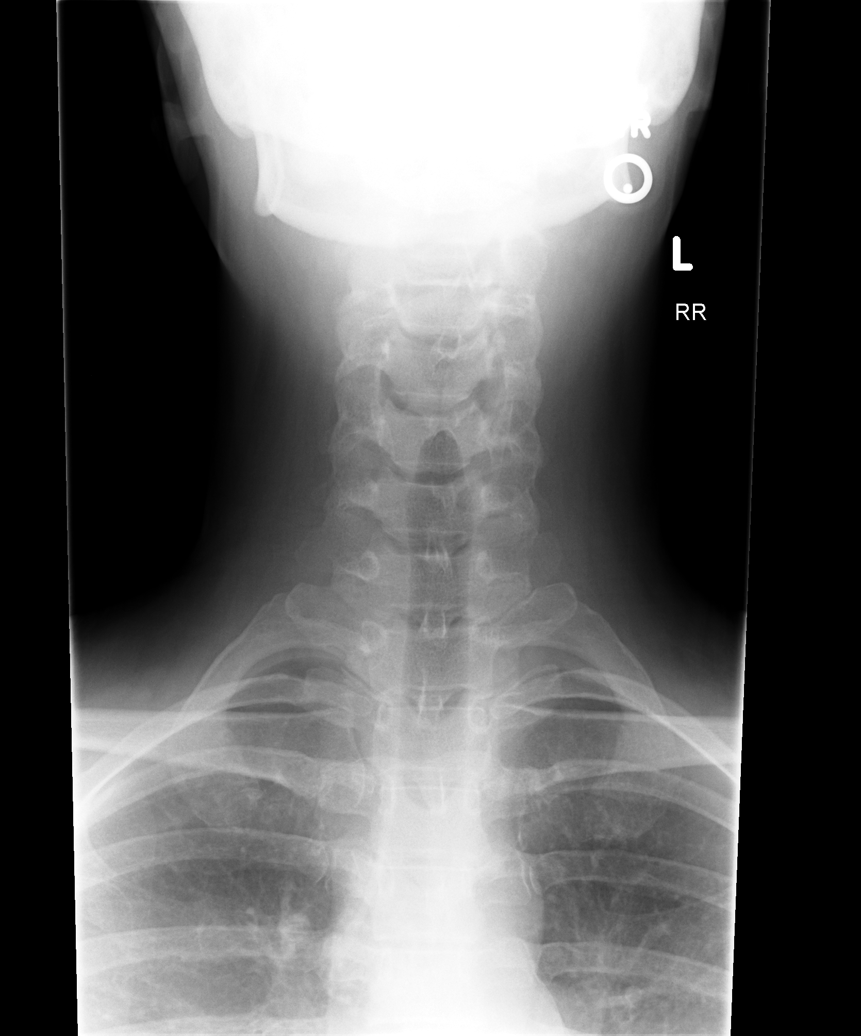

[view not recorded (5 of 5)]
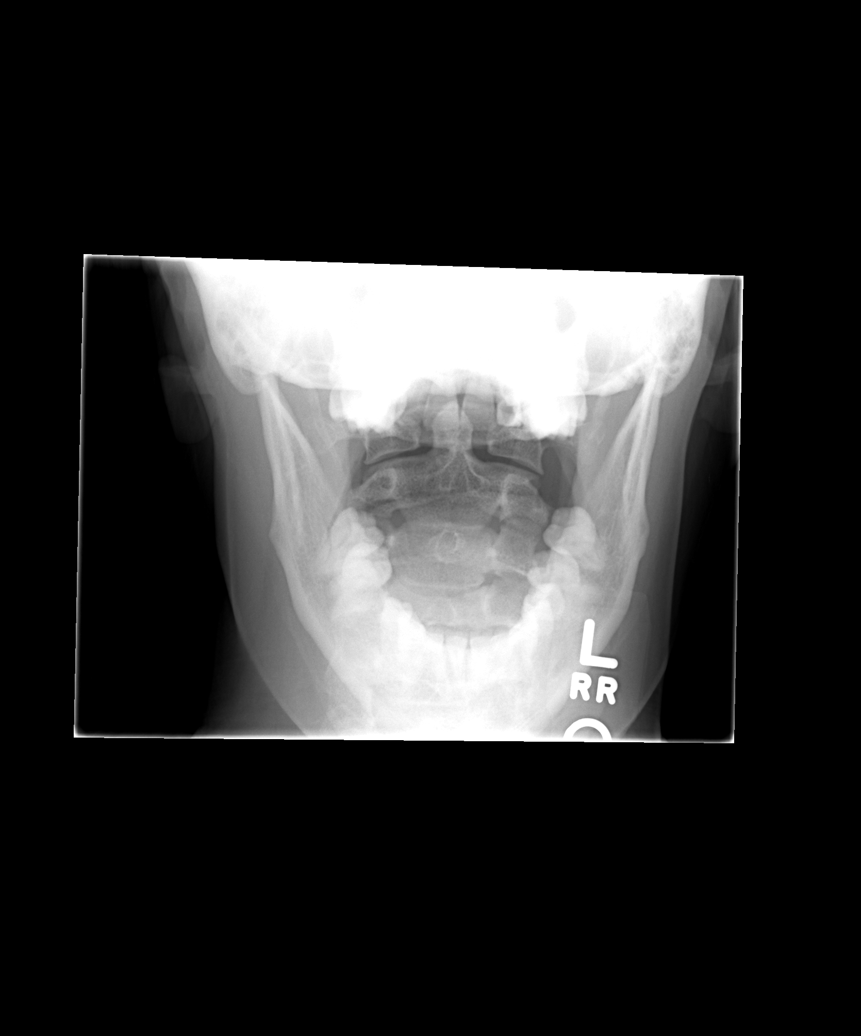

[5 of 5 positions shown; findings below may reference images not displayed]

FINDINGS: Frontal, lateral, open-mouth odontoid, and bilateral oblique views
were obtained. There is no fracture or spondylolisthesis.
Prevertebral soft tissues and predental space regions are normal.
Disc spaces appear intact. There is mild facet hypertrophy at C4-5
and C5-6 on the left and right sides. No erosive change.
IMPRESSION: Slight osteoarthritic change.  No fracture or spondylolisthesis.

## 2015-04-13 ENCOUNTER — Telehealth: Payer: Self-pay

## 2015-04-13 NOTE — Telephone Encounter (Signed)
Pt will come in to facility to have labs drawn for LFT.

## 2015-04-13 NOTE — Telephone Encounter (Signed)
-----   Message from Michelle Moody, [place] sent at 03/27/2015  8:03 AM EST ----- LFTS needed for 04/28/15

## 2015-04-20 ENCOUNTER — Ambulatory Visit (INDEPENDENT_AMBULATORY_CARE_PROVIDER_SITE_OTHER): Payer: 59 | Admitting: Family

## 2015-04-20 ENCOUNTER — Telehealth: Payer: Self-pay | Admitting: Family Medicine

## 2015-04-20 ENCOUNTER — Encounter: Payer: Self-pay | Admitting: Family

## 2015-04-20 ENCOUNTER — Telehealth: Payer: Self-pay | Admitting: *Deleted

## 2015-04-20 VITALS — BP 120/84 | HR 72 | Temp 98.4°F | Resp 18 | Ht 64.0 in | Wt 217.6 lb

## 2015-04-20 DIAGNOSIS — R079 Chest pain, unspecified: Secondary | ICD-10-CM | POA: Diagnosis not present

## 2015-04-20 DIAGNOSIS — Z79899 Other long term (current) drug therapy: Secondary | ICD-10-CM

## 2015-04-20 DIAGNOSIS — E785 Hyperlipidemia, unspecified: Secondary | ICD-10-CM

## 2015-04-20 DIAGNOSIS — M5412 Radiculopathy, cervical region: Secondary | ICD-10-CM | POA: Diagnosis not present

## 2015-04-20 LAB — LIPID PANEL
Cholesterol: 153 mg/dL (ref 0–200)
HDL: 34 mg/dL — ABNORMAL LOW (ref >39.00)
LDL Cholesterol: 99 mg/dL (ref 0–99)
NONHDL: 119.38
Total CHOL/HDL Ratio: 5
Triglycerides: 104 mg/dL (ref 0.0–149.0)
VLDL: 20.8 mg/dL (ref 0.0–40.0)

## 2015-04-20 LAB — D-DIMER, QUANTITATIVE (NOT AT ARMC): D DIMER QUANT: 0.22 ug{FEU}/mL (ref 0.00–0.48)

## 2015-04-20 MED ORDER — MELOXICAM 7.5 MG PO TABS: 7.5000 mg | ORAL_TABLET | Freq: Every day | ORAL | Status: DC

## 2015-04-20 NOTE — Progress Notes (Signed)
Pre visit review using our clinic review tool, if applicable. No additional management support is needed unless otherwise documented below in the visit note. 

## 2015-04-20 NOTE — Telephone Encounter (Signed)
Normal value.

## 2015-04-20 NOTE — Telephone Encounter (Signed)
Patient Name: Michelle Moody DOB: 10-25-1985 Initial Comment Caller states having light chest pain and pain in both arms Nurse Assessment Nurse: Verlin Fester RN, Stanton Kidney Date/Time (Eastern Time): 04/20/2015 9:25:01 AM Confirm and document reason for call. If symptomatic, describe symptoms. You must click the next button to save text entered. ---Patient states she is having chest pain and pain in both arms, She has a pinched nerve in her neck as well. Has the patient traveled out of the country within the last 30 days? ---No Does the patient have any new or worsening symptoms? ---Yes Will a triage be completed? ---Yes Related visit to physician within the last 2 weeks? ---No Does the PT have any chronic conditions? (i.e. diabetes, asthma, etc.) ---No Is the patient pregnant or possibly pregnant? (Ask all females between the ages of 64-55) ---No Is this a behavioral health or substance abuse call? ---No Guidelines Guideline Title Affirmed Question Affirmed Notes Chest Pain Pain also present in shoulder(s) or arm(s) or jaw (Exception: pain is clearly made worse by movement) Final Disposition User Go to ED Now Verlin Fester, RN, Stanton Kidney Comments After outcome patient states she doesn't want to go to Ed, asks if she can be seen in the office Referrals [place] REFUSED Disagree/Comply: Disagree Disagree/Comply Reason: Disagree with instructions

## 2015-04-20 NOTE — Telephone Encounter (Signed)
Caller: Janett Billow at [place] Result: D-dimer 0.22  Results read back w/ caller.
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
## 2015-04-20 NOTE — Telephone Encounter (Signed)
Noted  

## 2015-04-20 NOTE — Progress Notes (Signed)
Subjective:    Patient ID: Michelle Moody, female    DOB: Aug 14, 1985, 30 y.o.   MRN: [id number]  HPI  Michelle Moody is a 30 yr old female who presents today with chief complaint of burning sensation in her chest and esophagus x 1 week. She does have a family hx of heart disease.  Pain is worse with a deep breath. Denies fever or cough.  Had some nasal congestion last week.  Dad and 2 parents recently passed away.  She has been back and forth to [place] twice.  She denies calf pain/swelling. LMP 03/27/15.    She also reports pain in the right arm from shoulder to thumb x 2 weeks with burning sensation in the left forearm.  She does report some neck pain.   She reports that her HR was irregular 130-180 while walking on treadmill last week.    Review of Systems See HPI  Past Medical History  Diagnosis Date  . Hyperlipidemia   . Migraines   . History of dental surgery     2007  . Gallstones     02/2012  . Anxiety     2011  . Obese   . Anemia 07/06/2013  . IBS (irritable bowel syndrome)   . Elevated blood pressure   . Post-operative nausea and vomiting   . Rectal bleeding   . Diverticulosis     Social History   Social History  . Marital Status: Married    Spouse Name: N/A  . Number of Children: 2  . Years of Education: N/A   Occupational History  . Not on file.   Social History Main Topics  . Smoking status: Never Smoker   . Smokeless tobacco: Never Used  . Alcohol Use: No  . Drug Use: No  . Sexual Activity: Yes    Birth Control/ Protection: Condom     Comment: lives with husband and kids, works for [employer]   Other Topics Concern  . Not on file   Social History Narrative    Past Surgical History  Procedure Laterality Date  . Dental surgery    . Cyst excision      14 removed from scalp    Family History  Problem Relation Age of Onset  . Cancer Father 19    prostate  . Hyperlipidemia Father   . Hypertension Father   . Alzheimer's disease Father   . Dementia  Father   . Cancer Maternal Grandmother     ovarian  . Alcohol abuse Maternal Grandfather   . Cancer Maternal Grandfather     lung- smoker  . Stroke Paternal Grandmother   . Alzheimer's disease Paternal Grandmother   . Dementia Paternal Grandmother   . Heart disease Paternal Grandfather   . Hyperlipidemia Paternal Grandfather   . Hypertension Paternal Grandfather   . Thyroid disease Mother   . Colon polyps Mother   . Hyperlipidemia Brother   . Cancer Paternal Aunt 84    colon  . Crohn's disease Paternal Uncle     Allergies  Allergen Reactions  . Soy Allergy     Causes diarrhea  . Clindamycin/Lincomycin Nausea And Vomiting  . Latex Rash    Current Outpatient Prescriptions on File Prior to Visit  Medication Sig Dispense Refill  . cholecalciferol (VITAMIN D) 1000 UNITS tablet Take 1,000 Units by mouth daily.    . Coenzyme Q10 (CO Q 10 PO) Take by mouth.    . Ferrous Sulfate (IRON) 325 (65 FE) MG TABS  Take 1 tablet by mouth daily.    . Fish Oil-Cholecalciferol (FISH OIL + D3 PO) Take by mouth.    . Magnesium 400 MG TABS Take 1 tablet by mouth daily.    . Omega-3 Fatty Acids (FISH OIL CONCENTRATE PO) Take by mouth.    . ondansetron (ZOFRAN-ODT) 4 MG disintegrating tablet Take 4 mg by mouth every 8 (eight) hours as needed for nausea or vomiting.    . predniSONE (DELTASONE) 10 MG tablet Take 6 tablets day 1, 5 tabs day 2, 4 tabs day 3, 3 tabs day 4, 2 tabs day 5, 1 tabs day 6 21 tablet 0  . Prenatal Vit-Fe Fumarate-FA (PRENATAL MULTIVITAMIN) TABS tablet Take 1 tablet by mouth daily at 12 noon.    . promethazine (PHENERGAN) 25 MG tablet Take 1 tablet (25 mg total) by mouth every 6 (six) hours as needed for nausea. 15 tablet 1  . SUMAtriptan-naproxen (TREXIMET) 85-500 MG tablet Take 1tab at earliest onset of headache.  May repeat once in 2 hours if needed.  Do not exceed 2 tablets in 24 hours 10 tablet 0   No current facility-administered medications on file prior to visit.    BP  120/84 mmHg  Pulse 72  Temp(Src) 98.4 F (36.9 C) (Oral)  Resp 18  Ht 5\' 4"  (1.626 m)  Wt 217 lb 9.6 oz (98.703 kg)  BMI 37.33 kg/m2  SpO2 98%  LMP 03/28/2015       Objective:   Physical Exam  Constitutional: She is oriented to person, place, and time. She appears well-developed and well-nourished.  HENT:  Head: Normocephalic and atraumatic.  Cardiovascular: Normal rate, regular rhythm and normal heart sounds.   No murmur heard. Pulmonary/Chest: Effort normal and breath sounds normal. No respiratory distress. She has no wheezes.  Musculoskeletal: She exhibits no edema.  Neurological: She is alert and oriented to person, place, and time.  Bilateral UE strength is 5/5  Skin: Skin is warm and dry.  Psychiatric: She has a normal mood and affect. Her behavior is normal. Judgment and thought content normal.          Assessment & Plan:  Atypical CP- D. Dimer is negative.   EKG tracing is personally reviewed.  EKG notes NSR with some non-specific T wave changes.  No acute ischemic changes noted.  Suspect Viral etiology, however will refer for ETT to further assess.  Advised pt to let us know if symptoms worsen or do not improve.   C6 Radiculopathy- arm and thumb pain described most consistent with a C6 radiculopathy.  We discussed trial of meloxicam.  If no improvement with meloxicam consider further imaging of Cspine.

## 2015-04-20 NOTE — Patient Instructions (Addendum)
Please complete lab work prior to leaving. Start meloxicam once daily for next 1-2 weeks to see if it helps with your arm/thumb numbness. If symptoms do not improve please let us know.  You will be contacted about your stress test appointment. Please go to the ER if you develop severe/worsening chest pain.

## 2015-04-20 NOTE — Telephone Encounter (Signed)
Pt called in stating chest in arms and light chest pains. Transferred to [facility] with [facility].

## 2015-04-21 LAB — HEPATIC FUNCTION PANEL
ALK PHOS: 53 U/L (ref 39–117)
ALT: 20 U/L (ref 0–35)
AST: 17 U/L (ref 0–37)
Albumin: 4.5 g/dL (ref 3.5–5.2)
BILIRUBIN DIRECT: 0.1 mg/dL (ref 0.0–0.3)
TOTAL PROTEIN: 7.2 g/dL (ref 6.0–8.3)
Total Bilirubin: 0.3 mg/dL (ref 0.2–1.2)

## 2015-04-21 NOTE — Addendum Note (Signed)
Addended by: Caffie Pinto on: 04/21/2015 03:31 PM   Modules accepted: Orders

## 2015-04-28 ENCOUNTER — Ambulatory Visit: Payer: 59 | Admitting: Neurology

## 2015-05-06 ENCOUNTER — Ambulatory Visit (INDEPENDENT_AMBULATORY_CARE_PROVIDER_SITE_OTHER): Payer: 59

## 2015-05-06 ENCOUNTER — Encounter: Payer: Self-pay | Admitting: Family

## 2015-05-06 DIAGNOSIS — R079 Chest pain, unspecified: Secondary | ICD-10-CM

## 2015-05-06 LAB — EXERCISE TOLERANCE TEST
CHL CUP MPHR: 190 {beats}/min
CHL CUP RESTING HR STRESS: 80 {beats}/min
CHL RATE OF PERCEIVED EXERTION: 17
CSEPED: 10 min
CSEPEDS: 0 s
Estimated workload: 11.7 METS
Peak HR: 179 {beats}/min
Percent HR: 94 %

## 2015-05-14 ENCOUNTER — Encounter: Payer: Self-pay | Admitting: Family

## 2015-05-15 ENCOUNTER — Ambulatory Visit (INDEPENDENT_AMBULATORY_CARE_PROVIDER_SITE_OTHER): Payer: 59 | Admitting: Medical

## 2015-05-15 ENCOUNTER — Ambulatory Visit (HOSPITAL_BASED_OUTPATIENT_CLINIC_OR_DEPARTMENT_OTHER)
Admission: RE | Admit: 2015-05-15 | Discharge: 2015-05-15 | Disposition: A | Discharge status: Home / Self Care | Payer: 59 | Source: Ambulatory Visit | Attending: Medical | Admitting: Medical

## 2015-05-15 ENCOUNTER — Encounter: Payer: Self-pay | Admitting: Medical

## 2015-05-15 VITALS — BP 120/80 | HR 66 | Temp 98.0°F | Ht 63.5 in | Wt 216.2 lb

## 2015-05-15 DIAGNOSIS — S29011A Strain of muscle and tendon of front wall of thorax, initial encounter: Secondary | ICD-10-CM

## 2015-05-15 DIAGNOSIS — R0789 Other chest pain: Secondary | ICD-10-CM | POA: Insufficient documentation

## 2015-05-15 DIAGNOSIS — M898X1 Other specified disorders of bone, shoulder: Secondary | ICD-10-CM

## 2015-05-15 DIAGNOSIS — S29091A Other injury of muscle and tendon of front wall of thorax, initial encounter: Secondary | ICD-10-CM

## 2015-05-15 DIAGNOSIS — R0602 Shortness of breath: Secondary | ICD-10-CM | POA: Diagnosis not present

## 2015-05-15 LAB — D-DIMER, QUANTITATIVE (NOT AT ARMC): D DIMER QUANT: 0.23 ug{FEU}/mL (ref 0.00–0.48)

## 2015-05-15 LAB — TROPONIN I: TNIDX: 0 ug/l (ref 0.00–0.06)

## 2015-05-15 MED ORDER — KETOROLAC TROMETHAMINE 60 MG/2ML IM SOLN
60.0000 mg | Freq: Once | INTRAMUSCULAR | Status: AC
  Administered 2015-05-15: 60 mg via INTRAMUSCULAR

## 2015-05-15 MED ORDER — MELOXICAM 7.5 MG PO TABS: 7.5000 mg | ORAL_TABLET | Freq: Every day | ORAL | Status: DC

## 2015-05-15 MED FILL — MELOXICAM 7.5 MG TABLET: 7.5 | 14 days supply | Qty: 14 | Fill #0

## 2015-05-15 NOTE — Addendum Note (Signed)
Addended by: Tasia Catchings on: 05/15/2015 11:31 AM   Modules accepted: Orders

## 2015-05-15 NOTE — Patient Instructions (Addendum)
For your atypical chest pain we got ekg today. Will get troponin and d-dimer today again. Good to know these results before weekend. Also will get chest xray today.  For possible muscle pain we gave toradol 60 mg im. I will rmeloxican for pain and inflammation.  You mentioned briefly possilble left breast pain. I offered breast exam and was declined. But with understanding you would follow up with your pcp in 10-14 days.

## 2015-05-15 NOTE — Progress Notes (Signed)
Pre visit review using our clinic review tool, if applicable. No additional management support is needed unless otherwise documented below in the visit note. 

## 2015-05-15 NOTE — Progress Notes (Signed)
Subjective:    Patient ID: Michelle Moody, female    DOB: 1985-07-26, 30 y.o.   MRN: [id number]  HPI  Pt in for follow up. I reviewed last provider note. Pain still present. Comes and goes. Pt had negative d-dimer. Negative stress test with [address].   Pt pain varies in locations varies at times. She describes sometimes pain in upper pectoral areas and some pain in back. Pt denies wheezing. No cough. No chest xray done. No leg pain. Pt had normal stress test.   Pt feels 2-3 level pain presently. Notices is worse with breathing deep. No pain difference if she leans forward. Points to upper pectoral areas that radiates to shoulder blades.    Pt lost dad last 2 months ago.   Pt has not been on nsaid. Melissa NP did rx meloxicam and pt never got med filled.  LMP- April 27, 2015.   Review of Systems  Constitutional: Negative for chills, diaphoresis and fatigue.  Respiratory: Negative for cough, chest tightness, shortness of breath and wheezing.   Cardiovascular: Negative for chest pain and palpitations.  Gastrointestinal: Negative for abdominal pain.  Musculoskeletal: Positive for back pain.  Hematological: Negative for adenopathy. Does not bruise/bleed easily.    Past Medical History  Diagnosis Date  . Hyperlipidemia   . Migraines   . History of dental surgery     2007  . Gallstones     02/2012  . Anxiety     2011  . Obese   . Anemia 07/06/2013  . IBS (irritable bowel syndrome)   . Elevated blood pressure   . Post-operative nausea and vomiting   . Rectal bleeding   . Diverticulosis      Social History   Social History  . Marital Status: Married    Spouse Name: N/A  . Number of Children: 2  . Years of Education: N/A   Occupational History  . Not on file.   Social History Main Topics  . Smoking status: Never Smoker   . Smokeless tobacco: Never Used  . Alcohol Use: No  . Drug Use: No  . Sexual Activity: Yes    Birth Control/ Protection:  Condom     Comment: lives with husband and kids, works for [employer]   Other Topics Concern  . Not on file   Social History Narrative    Past Surgical History  Procedure Laterality Date  . Dental surgery    . Cyst excision      14 removed from scalp    Family History  Problem Relation Age of Onset  . Cancer Father 88    prostate  . Hyperlipidemia Father   . Hypertension Father   . Alzheimer's disease Father   . Dementia Father   . Cancer Maternal Grandmother     ovarian  . Alcohol abuse Maternal Grandfather   . Cancer Maternal Grandfather     lung- smoker  . Stroke Paternal Grandmother   . Alzheimer's disease Paternal Grandmother   . Dementia Paternal Grandmother   . Heart disease Paternal Grandfather   . Hyperlipidemia Paternal Grandfather   . Hypertension Paternal Grandfather   . Thyroid disease Mother   . Colon polyps Mother   . Hyperlipidemia Brother   . Cancer Paternal Aunt 50    colon  . Crohn's disease Paternal Uncle     Allergies  Allergen Reactions  . Soy Allergy     Causes diarrhea  . Clindamycin/Lincomycin Nausea And Vomiting  .  Latex Rash    Current Outpatient Prescriptions on File Prior to Visit  Medication Sig Dispense Refill  . cholecalciferol (VITAMIN D) 1000 UNITS tablet Take 1,000 Units by mouth daily.    . Coenzyme Q10 (CO Q 10 PO) Take by mouth.    . Ferrous Sulfate (IRON) 325 (65 FE) MG TABS Take 1 tablet by mouth daily.    . Fish Oil-Cholecalciferol (FISH OIL + D3 PO) Take by mouth.    . Magnesium 400 MG TABS Take 1 tablet by mouth daily.    . meloxicam (MOBIC) 7.5 MG tablet Take 1 tablet (7.5 mg total) by mouth daily. 14 tablet 0  . Omega-3 Fatty Acids (FISH OIL CONCENTRATE PO) Take by mouth.    . ondansetron (ZOFRAN-ODT) 4 MG disintegrating tablet Take 4 mg by mouth every 8 (eight) hours as needed for nausea or vomiting.    Marland Kitchen OVER THE COUNTER MEDICATION BUTTERBUR. Take 1 capsule every morning for migraines.    . Prenatal Vit-Fe  Fumarate-FA (PRENATAL MULTIVITAMIN) TABS tablet Take 1 tablet by mouth daily at 12 noon.    . promethazine (PHENERGAN) 25 MG tablet Take 1 tablet (25 mg total) by mouth every 6 (six) hours as needed for nausea. 15 tablet 1  . SUMAtriptan-naproxen (TREXIMET) 85-500 MG tablet Take 1tab at earliest onset of headache.  May repeat once in 2 hours if needed.  Do not exceed 2 tablets in 24 hours 10 tablet 0   No current facility-administered medications on file prior to visit.    BP 120/80 mmHg  Pulse 66  Temp(Src) 98 F (36.7 C) (Oral)  Ht 5' 3.5" (1.613 m)  Wt 216 lb 3.2 oz (98.068 kg)  BMI 37.69 kg/m2  SpO2 98%  LMP 03/28/2015       Objective:   Physical Exam  General Mental Status- Alert. General Appearance- Not in acute distress.   Skin General: Color- Normal Color. Moisture- Normal Moisture.  Anterior chest wall- pectoralis muslces tender to palpation. Worse on left side than rt but both tender.  Neck Carotid Arteries- Normal color. Moisture- Normal Moisture. No carotid bruits. No JVD.  Chest and Lung Exam Auscultation: Breath Sounds:-Normal.  Cardiovascular Auscultation:Rythm- Regular. Murmurs & Other Heart Sounds:Auscultation of the heart reveals- No Murmurs.  Abdomen Inspection:-Inspeection Normal. Palpation/Percussion:Note:No mass. Palpation and Percussion of the abdomen reveal- Non Tender, Non Distended + BS, no rebound or guarding.   Neurologic Cranial Nerve exam:- CN III-XII intact(No nystagmus), symmetric smile. Strength:- 5/5 equal and symmetric strength both upper and lower extremities.      Assessment & Plan:   No change on ekg compared to prior nsr. For your atypical chest pain we got ekg today. Will get troponin and d-dimer today again. Good to know these results before weekend. Also will get chest xray today.  For possible muscle pain we gave toradol 60 mg im. I will rx meloxicam for pain and inflammation.  You mentioned briefly possilble left  breast pain. I offered breast exam and was declined. But with understanding you would follow up with your pcp in 10-14 days.(pcp would perform)  Discussed also regarding dad death and pt possible having anxiety/stress reaction. Sometimes causing symptoms. PCP or myself may consider giving meds for mood but hesitant to assume current symptoms from underlying anxiety.  Considered getting echo. But based on location did not think necessary.

## 2015-05-21 ENCOUNTER — Encounter: Payer: Self-pay | Admitting: Neurology

## 2015-05-21 ENCOUNTER — Ambulatory Visit (INDEPENDENT_AMBULATORY_CARE_PROVIDER_SITE_OTHER): Payer: 59 | Admitting: Neurology

## 2015-05-21 VITALS — BP 122/68 | HR 75 | Ht 63.5 in | Wt 211.0 lb

## 2015-05-21 DIAGNOSIS — G43009 Migraine without aura, not intractable, without status migrainosus: Secondary | ICD-10-CM | POA: Diagnosis not present

## 2015-05-21 MED ORDER — ZOLMITRIPTAN 5 MG NA SOLN: NASAL | Status: DC

## 2015-05-21 NOTE — Patient Instructions (Addendum)
1.  Continue the supplements for now.   2.  For migraine attack, take zomig nasal spray.  Spray once in one nostril at earliest onset of migraine.  May repeat spray once in 2 hours if needed.  Do not exceed 2 sprays in 24 hours. 3.  Follow up in 4 months.

## 2015-05-22 ENCOUNTER — Encounter: Payer: Self-pay | Admitting: Neurology
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
## 2015-06-03 ENCOUNTER — Encounter: Payer: Self-pay | Admitting: Osteopathic Medicine

## 2015-06-03 ENCOUNTER — Ambulatory Visit (INDEPENDENT_AMBULATORY_CARE_PROVIDER_SITE_OTHER): Payer: 59 | Admitting: Osteopathic Medicine

## 2015-06-03 VITALS — BP 99/67 | HR 77 | Ht 63.5 in | Wt 210.0 lb

## 2015-06-03 DIAGNOSIS — Z0000 Encounter for general adult medical examination without abnormal findings: Secondary | ICD-10-CM

## 2015-06-03 LAB — COMPLETE METABOLIC PANEL WITH GFR
ALT: 13 U/L (ref 6–29)
AST: 14 U/L (ref 10–30)
Albumin: 4.3 g/dL (ref 3.6–5.1)
Alkaline Phosphatase: 57 U/L (ref 33–115)
BUN: 12 mg/dL (ref 7–25)
CALCIUM: 9.2 mg/dL (ref 8.6–10.2)
CHLORIDE: 104 mmol/L (ref 98–110)
CO2: 26 mmol/L (ref 20–31)
Creat: 0.77 mg/dL (ref 0.50–1.10)
GFR, Est African American: >89 mL/min (ref >=60)
Glucose, Bld: 94 mg/dL (ref 65–99)
POTASSIUM: 4.7 mmol/L (ref 3.5–5.3)
SODIUM: 140 mmol/L (ref 135–146)
Total Bilirubin: 0.3 mg/dL (ref 0.2–1.2)
Total Protein: 6.6 g/dL (ref 6.1–8.1)

## 2015-06-03 LAB — CBC WITH DIFFERENTIAL/PLATELET
Basophils Absolute: 0 cells/uL (ref 0–200)
Basophils Relative: 0 %
EOS PCT: 1 %
Eosinophils Absolute: 64 cells/uL (ref 15–500)
HEMATOCRIT: 36.9 % (ref 35.0–45.0)
Hemoglobin: 12.2 g/dL (ref 11.7–15.5)
LYMPHS PCT: 32 %
Lymphs Abs: 2048 cells/uL (ref 850–3900)
MCH: 27.1 pg (ref 27.0–33.0)
MCHC: 33.1 g/dL (ref 32.0–36.0)
MCV: 81.8 fL (ref 80.0–100.0)
MPV: 10.5 fL (ref 7.5–12.5)
Monocytes Absolute: 512 cells/uL (ref 200–950)
Monocytes Relative: 8 %
NEUTROS PCT: 59 %
Neutro Abs: 3776 cells/uL (ref 1500–7800)
Platelets: 296 10*3/uL (ref 140–400)
RBC: 4.51 MIL/uL (ref 3.80–5.10)
RDW: 14.8 % (ref 11.0–15.0)
WBC: 6.4 10*3/uL (ref 3.8–10.8)

## 2015-06-03 LAB — LIPID PANEL
CHOLESTEROL: 167 mg/dL (ref 125–200)
HDL: 41 mg/dL — AB (ref >=46)
LDL CALC: 101 mg/dL (ref <130)
Total CHOL/HDL Ratio: 4.1 Ratio (ref <=5.0)
Triglycerides: 127 mg/dL (ref <150)
VLDL: 25 mg/dL (ref <30)

## 2015-06-03 LAB — TSH: TSH: 3.31 mIU/L

## 2015-06-03 NOTE — Progress Notes (Signed)
HPI: Michelle Moody is a 30 y.o. female who presents to [facility] today for chief complaint of:  Chief Complaint  Patient presents with  . Establish Care    ANNUAL     Preventive care reviewed as below. No complaints today   MIGRAINE - Seeing neuro for migraine, Zomig recently started.    Past medical, social and family history reviewed: Past Medical History  Diagnosis Date  . Hyperlipidemia   . Migraines   . History of dental surgery     2007  . Gallstones     02/2012  . Anxiety     2011  . Obese   . Anemia 07/06/2013  . IBS (irritable bowel syndrome)   . Elevated blood pressure   . Post-operative nausea and vomiting   . Rectal bleeding   . Diverticulosis    Past Surgical History  Procedure Laterality Date  . Dental surgery    . Cyst excision      14 removed from scalp   Social History  Substance Use Topics  . Smoking status: Never Smoker   . Smokeless tobacco: Never Used  . Alcohol Use: No   Family History  Problem Relation Age of Onset  . Cancer Father 76    prostate  . Hyperlipidemia Father   . Hypertension Father   . Alzheimer's disease Father   . Dementia Father   . Cancer Maternal Grandmother     ovarian  . Alcohol abuse Maternal Grandfather   . Cancer Maternal Grandfather     lung- smoker  . Stroke Paternal Grandmother   . Alzheimer's disease Paternal Grandmother   . Dementia Paternal Grandmother   . Heart disease Paternal Grandfather   . Hyperlipidemia Paternal Grandfather   . Hypertension Paternal Grandfather   . Thyroid disease Mother   . Colon polyps Mother   . Hyperlipidemia Brother   . Cancer Paternal Aunt 16    colon  . Crohn's disease Paternal Uncle     Current Outpatient Prescriptions  Medication Sig Dispense Refill  . cholecalciferol (VITAMIN D) 1000 UNITS tablet Take 1,000 Units by mouth daily.    . Coenzyme Q10 (CO Q 10 PO) Take by mouth.    . Ferrous Sulfate (IRON) 325 (65 FE) MG TABS  Take 1 tablet by mouth daily.    . Fish Oil-Cholecalciferol (FISH OIL + D3 PO) Take by mouth.    . Magnesium 400 MG TABS Take 1 tablet by mouth daily.    . meloxicam (MOBIC) 7.5 MG tablet Take 1 tablet (7.5 mg total) by mouth daily. 14 tablet 0  . Omega-3 Fatty Acids (FISH OIL CONCENTRATE PO) Take by mouth.    . ondansetron (ZOFRAN-ODT) 4 MG disintegrating tablet Take 4 mg by mouth every 8 (eight) hours as needed for nausea or vomiting.    Marland Kitchen OVER THE COUNTER MEDICATION BUTTERBUR. Take 1 capsule every morning for migraines.    . Prenatal Vit-Fe Fumarate-FA (PRENATAL MULTIVITAMIN) TABS tablet Take 1 tablet by mouth daily at 12 noon.    . promethazine (PHENERGAN) 25 MG tablet Take 1 tablet (25 mg total) by mouth every 6 (six) hours as needed for nausea. 15 tablet 1  . Riboflavin 400 MG TABS Take by mouth.    . SUMAtriptan-naproxen (TREXIMET) 85-500 MG tablet Take 1tab at earliest onset of headache.  May repeat once in 2 hours if needed.  Do not exceed 2 tablets in 24 hours 10 tablet 0  . zolmitriptan (ZOMIG)  5 MG nasal solution 1 spray in one nostril at earliest onset of headache.  May repeat once in 2 hours if needed.  Do not exceed 2 sprays in 24 hours. 6 Units 3   No current facility-administered medications for this visit.   Allergies  Allergen Reactions  . Soy Allergy     Causes diarrhea  . Clindamycin/Lincomycin Nausea And Vomiting  . Latex Rash      Review of Systems: CONSTITUTIONAL:  No  fever, no chills, No  unintentional weight changes HEAD/EYES/EARS/NOSE/THROAT: No  headache, no vision change, no hearing change, No  sore throat, No  sinus pressure CARDIAC: No  chest pain, No  pressure, No palpitations, No  orthopnea RESPIRATORY: No  cough, No  shortness of breath/wheeze GASTROINTESTINAL: No  nausea, No  vomiting, No  abdominal pain, No  blood in stool, No  diarrhea, No  constipation  MUSCULOSKELETAL: No  myalgia/arthralgia GENITOURINARY: No  incontinence, No  abnormal genital  bleeding/discharge SKIN: No  rash/wounds/concerning lesions HEM/ONC: (+) occasional easy bruising/bleeding, No  abnormal lymph node ENDOCRINE: No polyuria/polydipsia/polyphagia, No  heat/cold intolerance  NEUROLOGIC: No  weakness, No  dizziness, No  slurred speech PSYCHIATRIC: No  concerns with depression, No  concerns with anxiety, No sleep problems  Exam:  BP 99/67 mmHg  Pulse 77  Ht 5' 3.5" (1.613 m)  Wt 210 lb (95.255 kg)  BMI 36.61 kg/m2  LMP 04/26/2015 Constitutional: VS see above. General Appearance: alert, well-developed, well-nourished, NAD Eyes: Normal lids and conjunctive, non-icteric sclera, PERRLA Ears, Nose, Mouth, Throat: MMM, Normal external inspection ears/nares/mouth/lips/gums, TM normal bilaterally. Pharynx no erythema, no exudate.  Neck: No masses, trachea midline. No thyroid enlargement/tenderness/mass appreciated. No lymphadenopathy Respiratory: Normal respiratory effort. no wheeze, no rhonchi, no rales Cardiovascular: S1/S2 normal, no murmur, no rub/gallop auscultated. RRR. No lower extremity edema. Gastrointestinal: Nontender, no masses. No hepatomegaly, no splenomegaly. No hernia appreciated. Bowel sounds normal. Rectal exam deferred.  Musculoskeletal: Gait normal. No clubbing/cyanosis of digits.  Neurological: No cranial nerve deficit on limited exam. Motor and sensation intact and symmetric Skin: warm, dry, intact. No rash/ulcer. No concerning nevi or subq nodules on limited exam.   Psychiatric: Normal judgment/insight. Normal mood and affect. Oriented x3.     ASSESSMENT/PLAN:  Annual physical exam - Plan: CBC with Differential/Platelet, COMPLETE METABOLIC PANEL WITH GFR, Lipid panel, TSH, VITAMIN D 25 Hydroxy (Vit-D Deficiency, Fractures)   FEMALE PREVENTIVE CARE  ANNUAL SCREENING/COUNSELING Tobacco - noNever  Alcohol - none Diet/Exercise - HEALTHY HABITS DISCUSSED TO DECREASE CV RISK Depression - PQH2 Negative Domestic violence concerns - no HTN  SCREENING - SEE VITALS Vaccination status - SEE BELOW  SEXUAL HEALTH Sexually active in the past year - yes With - Yes with female. STI - The patient denies history of sexually transmitted disease. STI testing today? - no   INFECTIOUS DISEASE SCREENING HIV - all adults 15-65 - does not need GC/CT - sexually active - does not need HepC - DOB [birth year] - does not need TB - does not need  DISEASE SCREENING Lipid - does not need DM2 - does not need Osteoporosis - does not need  CANCER SCREENING Cervical - does not need 09/19/12 - WILL BE DUE 09/2015 Breast - does not need Lung - does not need Colon - does not need   ADULT VACCINATION Influenza - already has Td - already has HPV - did not get Zoster - was not indicated Pneumonia - was not indicated  OTHER Fall - exercise and Vit D  age 11+ - does not need Consider ASA - age 25-59 - does not nee  All questions were answered. Visit summary with updated medication list and pertinent instructions was printed for patient. ER/RTC precautions were reviewed with the patient. Return in about 4 months (around 10/05/2015), or sooner if needed, for PAP TEST .

## 2015-06-03 NOTE — Patient Instructions (Signed)
Let's plan to follow-up here in the office in September 2017 for Pap test. Plan to follow-up every 12 months for annual wellness exam. Please don't hesitate to make an appointment sooner if you're having any acute concerns or problems!   Please let your pharmacy know when you are running low on medications/refills (do not wait until you are out of medicines). Your pharmacy will send our office a request for the appropriate medications. Please allow our office 2-3 business days to process the needed refills, it may take more time if we are refilling a medication which was initially prescribed to you by another physician.   At any visits to any of your specialists, or if you receive vaccines anywhere other than our office, please give them our clinic information so that they can forward [place] any records, including any tests which are done or changes to your medications. This allows all your physicians to communicate effectively, putting your primary care doctor at the center of your medical care and allowing [place] to effectively coordinate your care.   Please let us know if there is anything else we can do for you.  Take care! -Dr. Loni Muse.

## 2015-06-04 LAB — VITAMIN D 25 HYDROXY (VIT D DEFICIENCY, FRACTURES): VIT D 25 HYDROXY: 29 ng/mL — AB (ref 30–100)

## 2015-06-05 ENCOUNTER — Telehealth: Payer: Self-pay | Admitting: Neurology

## 2015-06-05 MED ORDER — PREDNISONE 10 MG PO TABS: ORAL_TABLET | ORAL | Status: DC

## 2015-06-05 MED FILL — ZOMIG 5 MG NASAL SPRAY: 5 | 30 days supply | Qty: 6 | Fill #0

## 2015-06-05 MED FILL — predniSONE 10 MG TABS: 10 | 6 days supply | Qty: 21 | Fill #0

## 2015-06-05 NOTE — Telephone Encounter (Signed)
Patient called - she had been migraine free for about 2 months and doing well on supplements but has now had one that has come and gone since Monday. She has not picked up Zomig yet because she is afraid it won't work. She had three Treximet pills left and took one on Tuesday, Wednesday, and Thursday which made the migraine go away temporarily but it keeps coming back. She is now nervous to get Zomig and it not work over the weekend and be stuck with a terrible migraine. I did let her know that she should not be taking abortive meds for headaches that often. Please advise.

## 2015-06-05 NOTE — Telephone Encounter (Signed)
We can prescribe her a prednisone taper to try and break these frequent headaches over the past week.  Prednisone 10mg  tablets.  Take 6tabs x1day, then 5tabs x1day, then 4tabs x1day, then 3tabs x1day, then 2tabs x1day, then 1tab x1day, then STOP.

## 2015-06-05 NOTE — Telephone Encounter (Signed)
Patient made aware. RX sent to pharmacy.  

## 2015-06-17 ENCOUNTER — Encounter: Payer: Self-pay | Admitting: *Deleted

## 2015-06-17 ENCOUNTER — Telehealth: Payer: Self-pay

## 2015-06-17 ENCOUNTER — Emergency Department
Admission: EM | Admit: 2015-06-17 | Discharge: 2015-06-17 | Disposition: A | Discharge status: Home / Self Care | Payer: 59 | Source: Home / Self Care | Attending: Family Medicine | Admitting: Family Medicine

## 2015-06-17 DIAGNOSIS — G43009 Migraine without aura, not intractable, without status migrainosus: Secondary | ICD-10-CM | POA: Diagnosis not present

## 2015-06-17 MED ORDER — PROMETHAZINE HCL 25 MG PO TABS: 25.0000 mg | ORAL_TABLET | Freq: Four times a day (QID) | ORAL | Status: AC | PRN

## 2015-06-17 MED ORDER — TOPIRAMATE 50 MG PO TABS: 50.0000 mg | ORAL_TABLET | Freq: Every day | ORAL | Status: DC

## 2015-06-17 MED ORDER — KETOROLAC TROMETHAMINE 60 MG/2ML IM SOLN
60.0000 mg | Freq: Once | INTRAMUSCULAR | Status: AC
  Administered 2015-06-17: 60 mg via INTRAMUSCULAR

## 2015-06-17 MED ORDER — METOCLOPRAMIDE HCL 5 MG/ML IJ SOLN
5.0000 mg | Freq: Once | INTRAMUSCULAR | Status: AC
  Administered 2015-06-17: 5 mg via INTRAMUSCULAR

## 2015-06-17 MED ORDER — DEXAMETHASONE SODIUM PHOSPHATE 10 MG/ML IJ SOLN
10.0000 mg | Freq: Once | INTRAMUSCULAR | Status: AC
  Administered 2015-06-17: 10 mg via INTRAMUSCULAR

## 2015-06-17 MED FILL — TOPIRAMATE 50 MG TABLET: 50 | 30 days supply | Qty: 30 | Fill #0 | Status: TO

## 2015-06-17 MED FILL — PROMETHAZINE 25 MG TABLET: 25 | 4 days supply | Qty: 15 | Fill #0

## 2015-06-17 NOTE — Telephone Encounter (Signed)
The Zomig works best at earliest onset of headache.  She can come in for a headache cocktail if she has a driver.

## 2015-06-17 NOTE — Telephone Encounter (Signed)
I would now recommend starting a preventative medication.  I recommend topiramate 50mg  at bedtime.  It is a common medication used for migraines and is very effective to help reduced intensity of headaches and frequency.  Possible side effects include: impaired thinking, sedation, paresthesias (numbness and tingling) and weight loss.  It may cause dehydration and there is a small risk for kidney stones, so make sure to stay hydrated with water during the day.  There is also a very small risk for glaucoma, so if you notice any change in your vision while taking this medication, see an ophthalmologist. I would give this dose 4 weeks and she should contact us with update at that time.  She should try the Zomig.  She can take promethazine 25mg  every 6 hours as needed for nausea.

## 2015-06-17 NOTE — ED Notes (Signed)
Pt c/o migraine x 1 day. She took OTC severe sinus at 0630 and Zomig at 1020 without relief.

## 2015-06-17 NOTE — ED Provider Notes (Signed)
CSN: [id number]     Arrival date & time 06/17/15  1344 History   First MD Initiated Contact with Patient 06/17/15 1409     Chief Complaint  Patient presents with  . Migraine      HPI Comments: Yesterday evening patient began developing what she thought was a "sinus headache" on her right side and began taking a decongestant.  The headache became worse today and she tried taking a Zomig tablet without relief.  Her headache is now a typical migraine.  She complains of nausea without vomiting.  She feels well otherwise.               Patient is a 30 y.o. female presenting with migraines. The history is provided by the patient.  Migraine This is a recurrent problem. The current episode started yesterday. The problem occurs constantly. The problem has been rapidly worsening. Pertinent negatives include no chest pain, no abdominal pain and no shortness of breath. Exacerbated by: movement and light exposure. Nothing relieves the symptoms. Treatments tried: Zomig. The treatment provided no relief.    Past Medical History  Diagnosis Date  . Hyperlipidemia   . Migraines   . History of dental surgery     2007  . Gallstones     02/2012  . Anxiety     2011  . Obese   . Anemia 07/06/2013  . IBS (irritable bowel syndrome)   . Elevated blood pressure   . Post-operative nausea and vomiting   . Rectal bleeding   . Diverticulosis    Past Surgical History  Procedure Laterality Date  . Dental surgery    . Cyst excision      14 removed from scalp   Family History  Problem Relation Age of Onset  . Cancer Father 56    prostate  . Hyperlipidemia Father   . Hypertension Father   . Alzheimer's disease Father   . Dementia Father   . Cancer Maternal Grandmother     ovarian  . Alcohol abuse Maternal Grandfather   . Cancer Maternal Grandfather     lung- smoker  . Stroke Paternal Grandmother   . Alzheimer's disease Paternal Grandmother   . Dementia Paternal Grandmother   . Heart disease Paternal  Grandfather   . Hyperlipidemia Paternal Grandfather   . Hypertension Paternal Grandfather   . Thyroid disease Mother   . Colon polyps Mother   . Hyperlipidemia Brother   . Cancer Paternal Aunt 16    colon  . Crohn's disease Paternal Uncle    Social History  Substance Use Topics  . Smoking status: Never Smoker   . Smokeless tobacco: Never Used  . Alcohol Use: No   OB History    Gravida Para Term Preterm AB TAB SAB Ectopic Multiple Living   2 2 2       2      Review of Systems  Respiratory: Negative for shortness of breath.   Cardiovascular: Negative for chest pain.  Gastrointestinal: Negative for abdominal pain.  All other systems reviewed and are negative.   Allergies  Soy allergy; Clindamycin/lincomycin; and Latex  Home Medications   Prior to Admission medications   Medication Sig Start Date End Date Taking? Authorizing Provider  cholecalciferol (VITAMIN D) 1000 UNITS tablet Take 1,000 Units by mouth daily.    Historical Provider, MD  Coenzyme Q10 (CO Q 10 PO) Take by mouth.    Historical Provider, MD  Ferrous Sulfate (IRON) 325 (65 FE) MG TABS Take 1 tablet  by mouth daily.    Historical Provider, MD  Fish Oil-Cholecalciferol (FISH OIL + D3 PO) Take by mouth.    Historical Provider, MD  Magnesium 400 MG TABS Take 1 tablet by mouth daily.    Historical Provider, MD  meloxicam (MOBIC) 7.5 MG tablet Take 1 tablet (7.5 mg total) by mouth daily. 05/15/15   Percell Miller Saguier, PA-C  Omega-3 Fatty Acids (FISH OIL CONCENTRATE PO) Take by mouth.    Historical Provider, MD  ondansetron (ZOFRAN-ODT) 4 MG disintegrating tablet Take 4 mg by mouth every 8 (eight) hours as needed for nausea or vomiting.    Historical Provider, MD  OVER THE COUNTER MEDICATION BUTTERBUR. Take 1 capsule every morning for migraines.    Historical Provider, MD  predniSONE (DELTASONE) 10 MG tablet 6tabs x1day, then 5tabs x1day, then 4tabs x1day, then 3tabs x1day, then 2tabs x1day, then 1tab x1day 06/05/15   Pieter Partridge, DO  Prenatal Vit-Fe Fumarate-FA (PRENATAL MULTIVITAMIN) TABS tablet Take 1 tablet by mouth daily at 12 noon.    Historical Provider, MD  promethazine (PHENERGAN) 25 MG tablet Take 1 tablet (25 mg total) by mouth every 6 (six) hours as needed for nausea. 06/17/15   Pieter Partridge, DO  Riboflavin 400 MG TABS Take by mouth.    Historical Provider, MD  topiramate (TOPAMAX) 50 MG tablet Take 1 tablet (50 mg total) by mouth at bedtime. 06/17/15   Pieter Partridge, DO  zolmitriptan (ZOMIG) 5 MG nasal solution 1 spray in one nostril at earliest onset of headache.  May repeat once in 2 hours if needed.  Do not exceed 2 sprays in 24 hours. 05/21/15   Pieter Partridge, DO   Meds Ordered and Administered this Visit   Medications  ketorolac (TORADOL) injection 60 mg (not administered)  metoCLOPramide (REGLAN) injection 5 mg (not administered)  dexamethasone (DECADRON) injection 10 mg (not administered)    BP 111/72 mmHg  Pulse 78  Temp(Src) 97.8 F (36.6 C) (Oral)  Resp 16  Ht 5' 3.5" (1.613 m)  Wt 209 lb (94.802 kg)  BMI 36.44 kg/m2  SpO2 97%  LMP 06/17/2015 No data found.   Physical Exam Nursing notes and Vital Signs reviewed. Appearance:  Patient appears stated age, uncomfortable sitting in darkened room. Eyes:  Pupils are equal, round, and reactive to light and accomodation.  Extraocular movement is intact.  Conjunctivae are not inflamed.  Fundi benign, mild photophobia present.                              Ears:  Canals normal.  Tympanic membranes normal.  Nose:   Normal turbinates.  No sinus tenderness.     Mouth/Pharynx:  Normal Neck:  Supple.  No adenopathy Lungs:  Clear to auscultation.  Breath sounds are equal.  Moving air well. Heart:  Regular rate and rhythm without murmurs, rubs, or gallops.  Abdomen:  Nontender without masses or hepatosplenomegaly.  Bowel sounds are present.  No CVA or flank tenderness.  Extremities:  No edema.  Skin:  No rash present.  Neurologic:  Cranial nerves 2  through 12 are normal.  Patellar, achilles, and elbow reflexes are normal.  Cerebellar function is intact (finger-to-nose and rapid alternating hand movement).  Gait and station are normal.     ED Course  Procedures none    MDM   1. Migraine without aura and without status migrainosus, not intractable    Administered Toradol 60mg   IM, Decadron 10mg  IM, and Reglan 5mg  IM. At next headache occurrence, try taking Zomig at first sign of headache. If symptoms become significantly worse during the night or over the weekend, proceed to the local emergency room.  Followup with neurologist.    Kandra Nicolas, MD 06/17/15 [phone]

## 2015-06-17 NOTE — Discharge Instructions (Signed)
At next headache occurrence, try taking Zomig at first sign of headache. If symptoms become significantly worse during the night or over the weekend, proceed to the local emergency room.    Recurrent Migraine Headache A migraine headache is an intense, throbbing pain on one or both sides of your head. Recurrent migraines keep coming back. A migraine can last for 30 minutes to several hours. CAUSES  The exact cause of a migraine headache is not always known. However, a migraine may be caused when nerves in the brain become irritated and release chemicals that cause inflammation. This causes pain. Certain things may also trigger migraines, such as:   Alcohol.  Smoking.  Stress.  Menstruation.  Aged cheeses.  Foods or drinks that contain nitrates, glutamate, aspartame, or tyramine.  Lack of sleep.  Chocolate.  Caffeine.  Hunger.  Physical exertion.  Fatigue.  Medicines used to treat chest pain (nitroglycerine), birth control pills, estrogen, and some blood pressure medicines. SYMPTOMS   Pain on one or both sides of your head.  Pulsating or throbbing pain.  Severe pain that prevents daily activities.  Pain that is aggravated by any physical activity.  Nausea, vomiting, or both.  Dizziness.  Pain with exposure to bright lights, loud noises, or activity.  General sensitivity to bright lights, loud noises, or smells. Before you get a migraine, you may get warning signs that a migraine is coming (aura). An aura may include:  Seeing flashing lights.  Seeing bright spots, halos, or zigzag lines.  Having tunnel vision or blurred vision.  Having feelings of numbness or tingling.  Having trouble talking.  Having muscle weakness. DIAGNOSIS  A recurrent migraine headache is often diagnosed based on:  Symptoms.  Physical examination.  A CT scan or MRI of your head. These imaging tests cannot diagnose migraines but can help rule out other causes of headaches.   TREATMENT  Medicines may be given for pain and nausea. Medicines can also be given to help prevent recurrent migraines. HOME CARE INSTRUCTIONS  Only take over-the-counter or prescription medicines for pain or discomfort as directed by your health care provider. The use of long-term narcotics is not recommended.  Lie down in a dark, quiet room when you have a migraine.  Keep a journal to find out what may trigger your migraine headaches. For example, write down:  What you eat and drink.  How much sleep you get.  Any change to your diet or medicines.  Limit alcohol consumption.  Quit smoking if you smoke.  Get 7-9 hours of sleep, or as recommended by your health care provider.  Limit stress.  Keep lights dim if bright lights bother you and make your migraines worse. SEEK MEDICAL CARE IF:   You do not get relief from the medicines given to you.  You have a recurrence of pain.  You have a fever. SEEK IMMEDIATE MEDICAL CARE IF:  Your migraine becomes severe.  You have a stiff neck.  You have loss of vision.  You have muscular weakness or loss of muscle control.  You start losing your balance or have trouble walking.  You feel faint or pass out.  You have severe symptoms that are different from your first symptoms. MAKE SURE YOU:   Understand these instructions.  Will watch your condition.  Will get help right away if you are not doing well or get worse.   This information is not intended to replace advice given to you by your health care provider. Make sure  you discuss any questions you have with your health care provider.   Document Released: 09/28/2000 Document Revised: 01/24/2014 Document Reviewed: 09/10/2012 Elsevier Interactive Patient Education [employer].

## 2015-06-17 NOTE — Telephone Encounter (Signed)
Rxs sent in. Pt aware of recommendation. Pt states she has taken 1 dose of Zomig (@ 10:30) w/o relief. She did not take any Nsaids w/ it. Is about to take second dose. Pt would like to know if there is anything she else she can take to break this headache.

## 2015-06-17 NOTE — Telephone Encounter (Signed)
Pt called w/ complaints of migraine. States for the last few weeks she's been having increasing trouble w/ headache/migraines. Pt was given a prednisone taper on 519/17. Pt states that did help stop that set of headaches she was experiencing. Today pt is complaining of a sinus headache that is progressing into a migraine. Pt states she took sinus medication about 630 this morning for sinus pressure, but it is continuing to worsen and is triggering a migraine, pt unsure if she can/should use Zomig for this headache. Pt states she is also very nauseous. Please advise.

## 2015-06-17 NOTE — Telephone Encounter (Signed)
Message left on pt's vm

## 2015-06-17 NOTE — Telephone Encounter (Signed)
She would like you to call her on her cell [phone]

## 2015-06-23 ENCOUNTER — Encounter: Payer: Self-pay | Admitting: Osteopathic Medicine

## 2015-06-23 ENCOUNTER — Ambulatory Visit (INDEPENDENT_AMBULATORY_CARE_PROVIDER_SITE_OTHER): Payer: 59 | Admitting: Osteopathic Medicine

## 2015-06-23 VITALS — BP 114/76 | HR 80 | Temp 97.7°F | Wt 210.0 lb

## 2015-06-23 DIAGNOSIS — J329 Chronic sinusitis, unspecified: Secondary | ICD-10-CM

## 2015-06-23 DIAGNOSIS — A499 Bacterial infection, unspecified: Secondary | ICD-10-CM | POA: Diagnosis not present

## 2015-06-23 DIAGNOSIS — G43909 Migraine, unspecified, not intractable, without status migrainosus: Secondary | ICD-10-CM

## 2015-06-23 DIAGNOSIS — B9689 Other specified bacterial agents as the cause of diseases classified elsewhere: Secondary | ICD-10-CM

## 2015-06-23 MED ORDER — IPRATROPIUM BROMIDE 0.03 % NA SOLN: 2.0000 | Freq: Three times a day (TID) | NASAL | Status: DC | PRN

## 2015-06-23 MED ORDER — AMOXICILLIN-POT CLAVULANATE 875-125 MG PO TABS: 1.0000 | ORAL_TABLET | Freq: Two times a day (BID) | ORAL | Status: DC

## 2015-06-23 MED FILL — IPRATROPIUM 0.03% SPRAY: 0.03 | 30 days supply | Qty: 30 | Fill #0

## 2015-06-23 MED FILL — AMOX-CLAV 875-125 MG TABLET: 875-125 | 5 days supply | Qty: 10 | Fill #0

## 2015-06-23 NOTE — Progress Notes (Signed)
HPI: Michelle Moody is a 30 y.o. female who presents to [facility]  today for chief complaint of:  Chief Complaint  Patient presents with  . Sinus Problem   HEADACHE . Context: R side sinueses . Location: headache . Quality: pressure that just wouldn't quit . Assoc signs/symptoms: see ROS . Duration: few weeks . Modifying factors: has tried the following OTC/Rx medications: Prednisone from neurologist got a bit better but then came back, seen by urgent care last week, prednisone shot, went away altogether and then came back. Also tried apple cider steaming, sudafed. Zomig does nothing. (UC did Toradol, Decadron, Reglan) (5/19 prednisone taper, then 5/31 started Topamax)   MIGRAINE- records reviewed, see above re: therapy for headache from Neuro and UC, topamax started, she nots it's not helping headache and seems to be making her nauseous.    Past medical, social and family history reviewed. Current medications and allergies reviewed.     Review of Systems: CONSTITUTIONAL: no fever/chills HEAD/EYES/EARS/NOSE/THROAT: yes headache, no vision change or hearing change, no sore throat CARDIAC: No chest pain, No pressure/palpitations RESPIRATORY: no cough, no shortness of breath GASTROINTESTINAL: no nausea, no vomiting, no abdominal pain, no diarrhea MUSCULOSKELETAL: no myalgia/arthralgia SKIN: no rash   Exam:  BP 114/76 mmHg  Pulse 80  Temp(Src) 97.7 F (36.5 C) (Oral)  Wt 210 lb (95.255 kg)  LMP 06/17/2015 Constitutional: VSS, see above. General Appearance: alert, well-developed, well-nourished, NAD Eyes: Normal lids and conjunctive, non-icteric sclera, PERRLA Ears, Nose, Mouth, Throat: Normal external inspection ears/nares/mouth/lips/gums, abnormal - mild clear effusion behind L TM TM, MMM;       posterior pharynx without erythema, without exudate, nasal mucosa normal Neck: No masses, trachea midline. normal lymph nodes Respiratory:  Normal respiratory effort. No  wheeze/rhonchi/rales Cardiovascular: S1/S2 normal, no murmur/rub/gallop auscultated. RRR.   No results found for this or any previous visit (from the past 72 hour(s)). No results found.   ASSESSMENT/PLAN: Not been treated w/ abx - has been treated as migraine, may have missed a bacterial sinusitis so will trial abx and nasal spray, recommended OTC antihistamine/decongestant and Ibuprofen or Tylenol for pain. Recommend talk to neurologist re: d/c Topamax if it's not helping her. If abx don't help then would consider urgent appointment with neurology and/or imaging.   Bacterial sinusitis - Plan: amoxicillin-clavulanate (AUGMENTIN) 875-125 MG tablet, ipratropium (ATROVENT) 0.03 % nasal spray  Migraine without status migrainosus, not intractable, unspecified migraine type    Visit summary was printed for the patient with medications and pertinent instructions for patient to review. ER/RTC precautions reviewed. All questions answered. Return if symptoms worsen or fail to improve in 5 days - call or message me in MyChart.

## 2015-06-23 NOTE — Patient Instructions (Signed)
Sinusitis, Adult Sinusitis is redness, soreness, and inflammation of the paranasal sinuses. Paranasal sinuses are air pockets within the bones of your face. They are located beneath your eyes, in the middle of your forehead, and above your eyes. In healthy paranasal sinuses, mucus is able to drain out, and air is able to circulate through them by way of your nose. However, when your paranasal sinuses are inflamed, mucus and air can become trapped. This can allow bacteria and other germs to grow and cause infection. Sinusitis can develop quickly and last only a short time (acute) or continue over a long period (chronic). Sinusitis that lasts for more than 12 weeks is considered chronic. CAUSES Causes of sinusitis include:  Allergies.  Structural abnormalities, such as displacement of the cartilage that separates your nostrils (deviated septum), which can decrease the air flow through your nose and sinuses and affect sinus drainage.  Functional abnormalities, such as when the small hairs (cilia) that line your sinuses and help remove mucus do not work properly or are not present. SIGNS AND SYMPTOMS Symptoms of acute and chronic sinusitis are the same. The primary symptoms are pain and pressure around the affected sinuses. Other symptoms include:  Upper toothache.  Earache.  Headache.  Bad breath.  Decreased sense of smell and taste.  A cough, which worsens when you are lying flat.  Fatigue.  Fever.  Thick drainage from your nose, which often is green and may contain pus (purulent).  Swelling and warmth over the affected sinuses. DIAGNOSIS Your health care provider will perform a physical exam. During your exam, your health care provider may perform any of the following to help determine if you have acute sinusitis or chronic sinusitis:  Look in your nose for signs of abnormal growths in your nostrils (nasal polyps).  Tap over the affected sinus to check for signs of  infection.  View the inside of your sinuses using an imaging device that has a light attached (endoscope). If your health care provider suspects that you have chronic sinusitis, one or more of the following tests may be recommended:  Allergy tests.  Nasal culture. A sample of mucus is taken from your nose, sent to a lab, and screened for bacteria.  Nasal cytology. A sample of mucus is taken from your nose and examined by your health care provider to determine if your sinusitis is related to an allergy. TREATMENT Most cases of acute sinusitis are related to a viral infection and will resolve on their own within 10 days. Sometimes, medicines are prescribed to help relieve symptoms of both acute and chronic sinusitis. These may include pain medicines, decongestants, nasal steroid sprays, or saline sprays. However, for sinusitis related to a bacterial infection, your health care provider will prescribe antibiotic medicines. These are medicines that will help kill the bacteria causing the infection. Rarely, sinusitis is caused by a fungal infection. In these cases, your health care provider will prescribe antifungal medicine. For some cases of chronic sinusitis, surgery is needed. Generally, these are cases in which sinusitis recurs more than 3 times per year, despite other treatments. HOME CARE INSTRUCTIONS  Drink plenty of water. Water helps thin the mucus so your sinuses can drain more easily.  Use a humidifier.  Inhale steam 3-4 times a day (for example, sit in the bathroom with the shower running).  Apply a warm, moist washcloth to your face 3-4 times a day, or as directed by your health care provider.  Use saline nasal sprays to help   moisten and clean your sinuses.  Take medicines only as directed by your health care provider.  If you were prescribed either an antibiotic or antifungal medicine, finish it all even if you start to feel better. SEEK IMMEDIATE MEDICAL CARE IF:  You have  increasing pain or severe headaches.  You have nausea, vomiting, or drowsiness.  You have swelling around your face.  You have vision problems.  You have a stiff neck.  You have difficulty breathing.   This information is not intended to replace advice given to you by your health care provider. Make sure you discuss any questions you have with your health care provider.   Document Released: 01/03/2005 Document Revised: 01/24/2014 Document Reviewed: 01/18/2011 Elsevier Interactive Patient Education 2016 [place] CARE INSTRUCTIONS:  FIRST, A FEW NOTES ON OVER-THE-COUNTER MEDICATIONS!   USE CAUTION - MANY OVER-THE-COUNTER MEDICATIONS COME IN COMBINATIONS OF MULTIPLE GENERIC MEDICINES. FOR INSTANCE, NYQUIL HAS TYLENOL + COUGH MEDICINE + AN ANTIHISTAMINE, SO BE CAREFUL YOU'RE NOT TAKING A COMBINATION MEDICINE WHICH CONTAINS MEDICATIONS YOU'RE ALSO TAKING SEPARATELY (LIKE NYQUIL SYRUP PLUS TYLENOL PILLS).   YOUR PHARMACIST CAN HELP YOU AVOID MEDICATION INTERACTIONS AND DUPLICATIONS - ASK FOR THEIR HELP IF YOU ARE CONFUSED OR UNSURE ABOUT WHAT TO PURCHASE OVER-THE-COUNTER!   REMEMBER - IF YOU'RE EVER CONCERNED ABOUT MEDICATION SIDE EFFECTS, OR IF YOU'RE EVER CONCERNED YOUR SYMPTOMS ARE GETTING WORSE DESPITE TREATMENT, PLEASE CALL THE DOCTOR'S OFFICE! IF AFTER-HOURS, YOU CAN BE SEEN IN URGENT CARE OR, FOR SEVERE ILLNESS, PLEASE GO TO THE EMERGENCY ROOM.   TREAT SINUS CONGESTION, RUNNY NOSE & POSTNASAL DRIP:  Treat to increase airflow through sinuses, decrease congestion pain and prevent bacterial growth!   Remember, only 0.5-2% of sinus infections are due to a bacteria, the rest are due to a virus (usually the common cold) which will not get better with antibiotics!  NASAL SPRAYS: generally safe and should not interact with other medicines. Can take any of these medications, either alone or together... FLONASE (FLUTICASONE) - 2 sprays in each  nostril twice per day (also a great allergy medicine to use long-term!) AFRIN (OXYMETOLAZONE) - use sparingly because it will cause rebound congestion, NEVER USE IN KIDS  SALINE NASAL SPRAY- no limit, but avoid use after other nasal sprays or it can wash the medicine away PRESCRTIPTION ATROVENT - as directed on prescription bottle  ANTIHISTAMINES: Helps dry out runny nose and decreases postnasal drip. Benadryl may cause drowsiness but other preparations should not be as sedating. Certain kinds are not as safe in elderly individuals. OK to use unless Dr A says otherwise.  Only use one of the following... BENADRYL (DIPHENHYDRAMINE) - 25-50 mg every 6 hours ZYRTEC (CETIRIZINE) - 5-10 mg daily CLARITIN (LORATIDINE) - less potent. 10 mg daily ALLEGRA (FEXOFENADINE) - least likely to cause drowsiness! 180 mg daily or 60 mg twice per day  DECONGESTANTS: Helps dry out runny nose and helps with sinus pain. May cause insomnia, or sometimes elevated heart rate. Can cause problems if used often in people with high blood pressure. OK to use unless Dr A says otherwise. NEVER USE IN KIDS UNDER 42 YEARS OLD. Only use one of the following... SUDAFED (PSEUDOEPHEDINE) - 60 mg every 4 - 6 hours, also comes in 120 mg extended release every 12 hours, maximum 240 mg in 24 hours.  SUDAED PE (PHENYLEPHRINE) - 10 mg every 4 - 6 hours, maximum 60 mg per day  COMBINATIONS OF ABOVE ANTIHISTAMINES +  DECONGESTANTS: these usually require you to show your ID at the pharmacy counter. You can also purchase these medicines separately as noted above.  Only use one of the following... ZYRTEC-D (CETIRIZINE + PSEUDOEPHEDRINE) - 12 hour formulation as directed CLARITIN-D (LORATIDINE + PSEUDOEPHEDRINE) - 12 and 24 hour formulations as directed ALLEGRA-D (FEXOFENADINE + PSEUDOEPHEDRINE) - 12 and 24 hour formulations as directed  PRESCRIPTION TREATMENT FOR SINUS PROBLEMS: Can include nasal sprays, pills, or antibiotics in the case of  true bacterial infection. Not everyone needs an antibiotic but there are other medicines which will help you feel better while your body fights the infection!    TREAT COUGH & SORE THROAT:  Remember, cough is the body's way of protecting your airways and lungs, it's a hard-wired reflex that is tough for medicines to treat!   Irritation to the airways will cause cough. This irritation is usually caused by upper airway problems like postnasal drip (treat as above) and viral sore throat, but in severe cases can be due to lower airway problems like bronchitis or pneumonia, which a doctor can usually hear on exam of your lungs or see on an X-ray.  Sore throat is almost always due to a virus, but occasionally caused by certain strains of Strep, which requires antibiotics.   Exercise and smoking may make cough worse - take it easy while you're sick, and QUIT SMOKING!   Cough due to viral infection can linger for 2-3 weeks or so. If you're coughing longer than you think you should, or if the cough is severe, please make an appointment in the office - you may need a chest X-ray.  EXPECTORANT: Used to help clear airways, take these with PLENTY of water to help thin mucus secretions and make the mucus easier to cough up  Only use one of the following... ROBITUSSIN (DEXTROMETHORPHAN OR GUAIFENISEN depending on formulation)  MUCINEX (GUAIFENICEN) - usually longer acting  COUGH DROPS/LOZENGES: Whichever over-the-counter agent you prefer!  Here are some suggestions for ingredients to look for (can take both)... BENZOCAINE - numbing effect, also in [place] - cooling effect  HONEY: has gone head-to-head in several clinical trials with prescription cough medicines and found to be equally effective! Try 1 Teaspoon Honey + 2 Drops Lemon Juice, as much as you want to use. NONE FOR KIDS UNDER AGE 59  HERBAL TEA: There are certain ingredients which help "coat the throat" to relieve  pain  such as ELM BARK, LICORICE ROOT, MARSHMALLOW ROOT  PRESCRIPTION TREATMENT FOR COUGH: Reserved for severe cases. Can include pills, syrups or inhalers.    TREAT ACHES & PAINS, FEVER:  Illness causes aches, pains and fever as your body increases its natural inflammation response to help fight the infection.   Rest, good hydration and nutrition, and taking anti-inflammatory medications will help.   Remember: a true fever is a temperature 100.4 or higher. If you have a fever that is 105.0 or higher, that is a dangerous level and needs medical attention in the office or in the ER!  Can take both of these together if it's ok with your doctor... IBUPROFEN - 400-800 mg every 6 - 8 hours. Ibuprofen and similar medications can cause problems for people with heart disease or history of stomach ulcers, check with Dr A first if you're concerned. Lower doses are usually safe and effective.  TYLENOL (ACETAMINOPHEN) - [phone] mg every 6 hours. It won't cause problems with heart or stomach.     TREAT GASTROINTESTINAL SYMPTOMS:  Hydrate, hydrate, hydrate! Try drinking Gatorade/Powerade, broth/soup. Avoid milk and juice, these can make diarrhea worse. You can drink water, of course, but if you are also having vomiting and loose stool you are losing electrolytes which water alone can't replace.  IMMODIUM (LOPERAMIDE) - as directed on the bottle to help with loose stool PRESCRIPTION ZOFRAN (ONDANSETRON) OR PHENERGAN (PROMETHAZINE) - as directed to help nausea and vomiting. Try taking these before you eat if you are having trouble keeping food down.     REMEMBER - THE MOST IMPORTANT THINGS YOU CAN DO TO AVOID CATCHING OR SPREADING ILLNESS INCLUDE:   COVER YOUR COUGH WITH YOUR ARM, NOT WITH YOUR HANDS!   DISINFECT COMMONLY USED SURFACES (SUCH AS TELEPHONES & DOORKNOBS) WHEN YOU OR SOMEONE CLOSE TO YOU IS FEELING SICK!   BE SURE VACCINES ARE UP TO DATE - GET A FLU SHOT EVERY YEAR! THE FLU  CAN BE DEADLY FOR BABIES, ELDERLY FOLKS, AND PEOPLE WITH WEAK IMMUNE SYSTEMS - YOU SHOULD BE VACCINATED TO HELP PREVENT YOURSELF FROM GETTING SICK, BUT ALSO TO PREVENT SOMEONE ELSE FROM GETTING AN INFECTION WHICH MAY HOSPITALIZE OR KILL THEM.  GOOD NUTRITION AND HEALTHY LIFESTYLE WILL HELP YOUR IMMUNE SYSTEM YEAR-ROUND! THERE IS NO MAGIC SUPPLEMENT!  AND ABOVE ALL - [place] YOUR HANDS OFTEN!

## 2015-07-03 DIAGNOSIS — M9907 Segmental and somatic dysfunction of upper extremity: Secondary | ICD-10-CM | POA: Diagnosis not present

## 2015-07-03 DIAGNOSIS — M25511 Pain in right shoulder: Secondary | ICD-10-CM | POA: Diagnosis not present

## 2015-07-03 DIAGNOSIS — M546 Pain in thoracic spine: Secondary | ICD-10-CM | POA: Diagnosis not present

## 2015-07-03 DIAGNOSIS — M9902 Segmental and somatic dysfunction of thoracic region: Secondary | ICD-10-CM | POA: Diagnosis not present

## 2015-07-03 DIAGNOSIS — M545 Low back pain: Secondary | ICD-10-CM | POA: Diagnosis not present

## 2015-07-03 DIAGNOSIS — M9901 Segmental and somatic dysfunction of cervical region: Secondary | ICD-10-CM | POA: Diagnosis not present

## 2015-07-03 DIAGNOSIS — M9903 Segmental and somatic dysfunction of lumbar region: Secondary | ICD-10-CM | POA: Diagnosis not present

## 2015-07-03 DIAGNOSIS — M542 Cervicalgia: Secondary | ICD-10-CM | POA: Diagnosis not present

## 2015-07-16 IMAGING — CR DG FOOT COMPLETE 3+V*L*
3 series · 3 of 3 positions shown · non-contrast
Comparison: None.

CLINICAL DATA: 28-year-old female with a history of right foot pain
for 1 month. No known injury.

EXAM:
LEFT FOOT - COMPLETE 3+ VIEW

[foot ap]
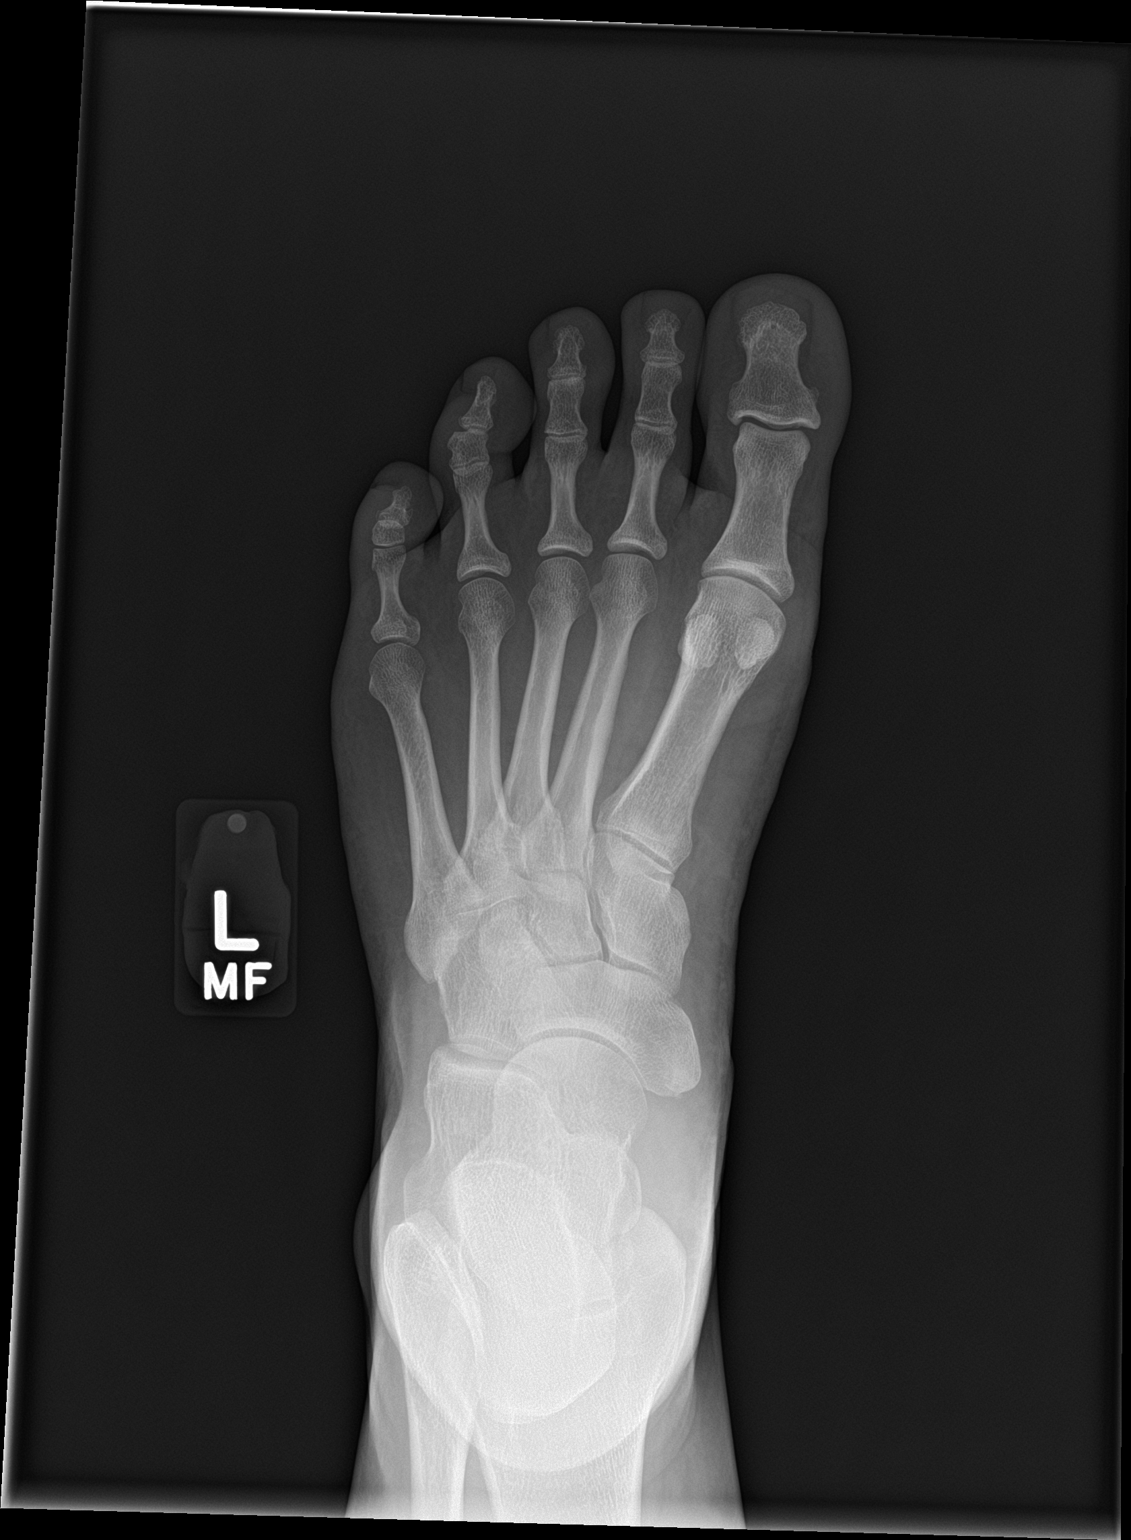

[foot obl]
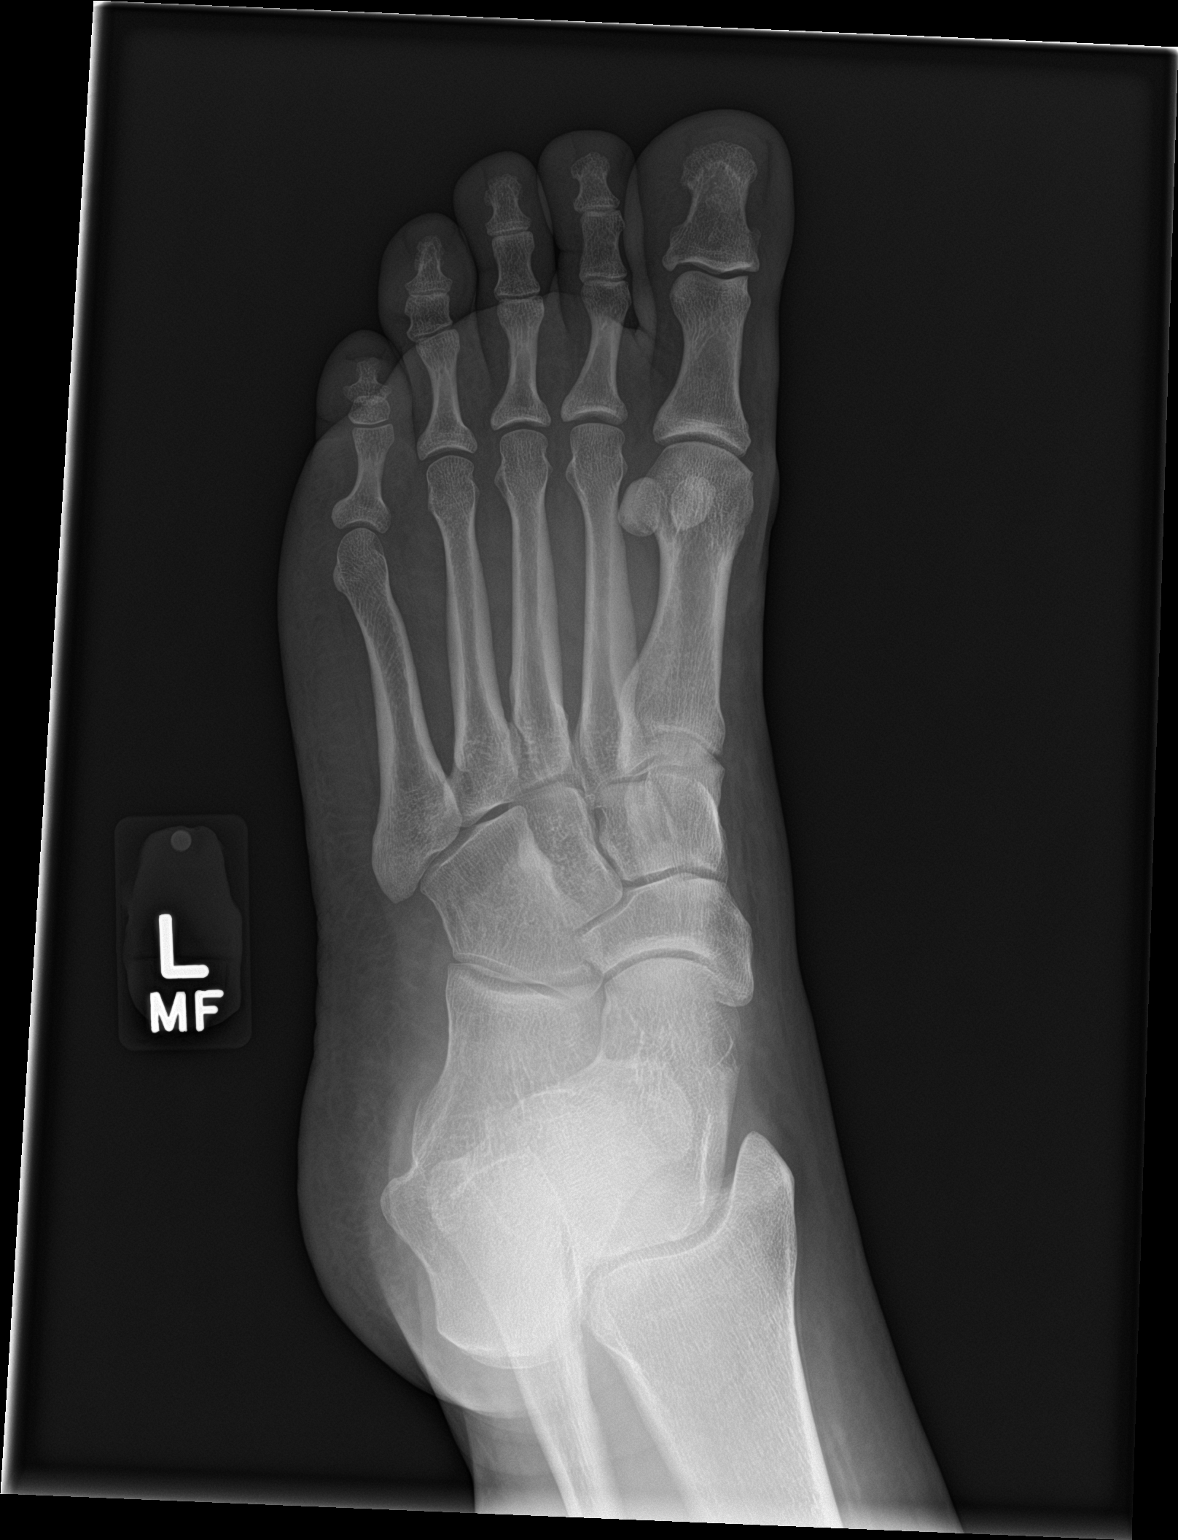

[foot lat]
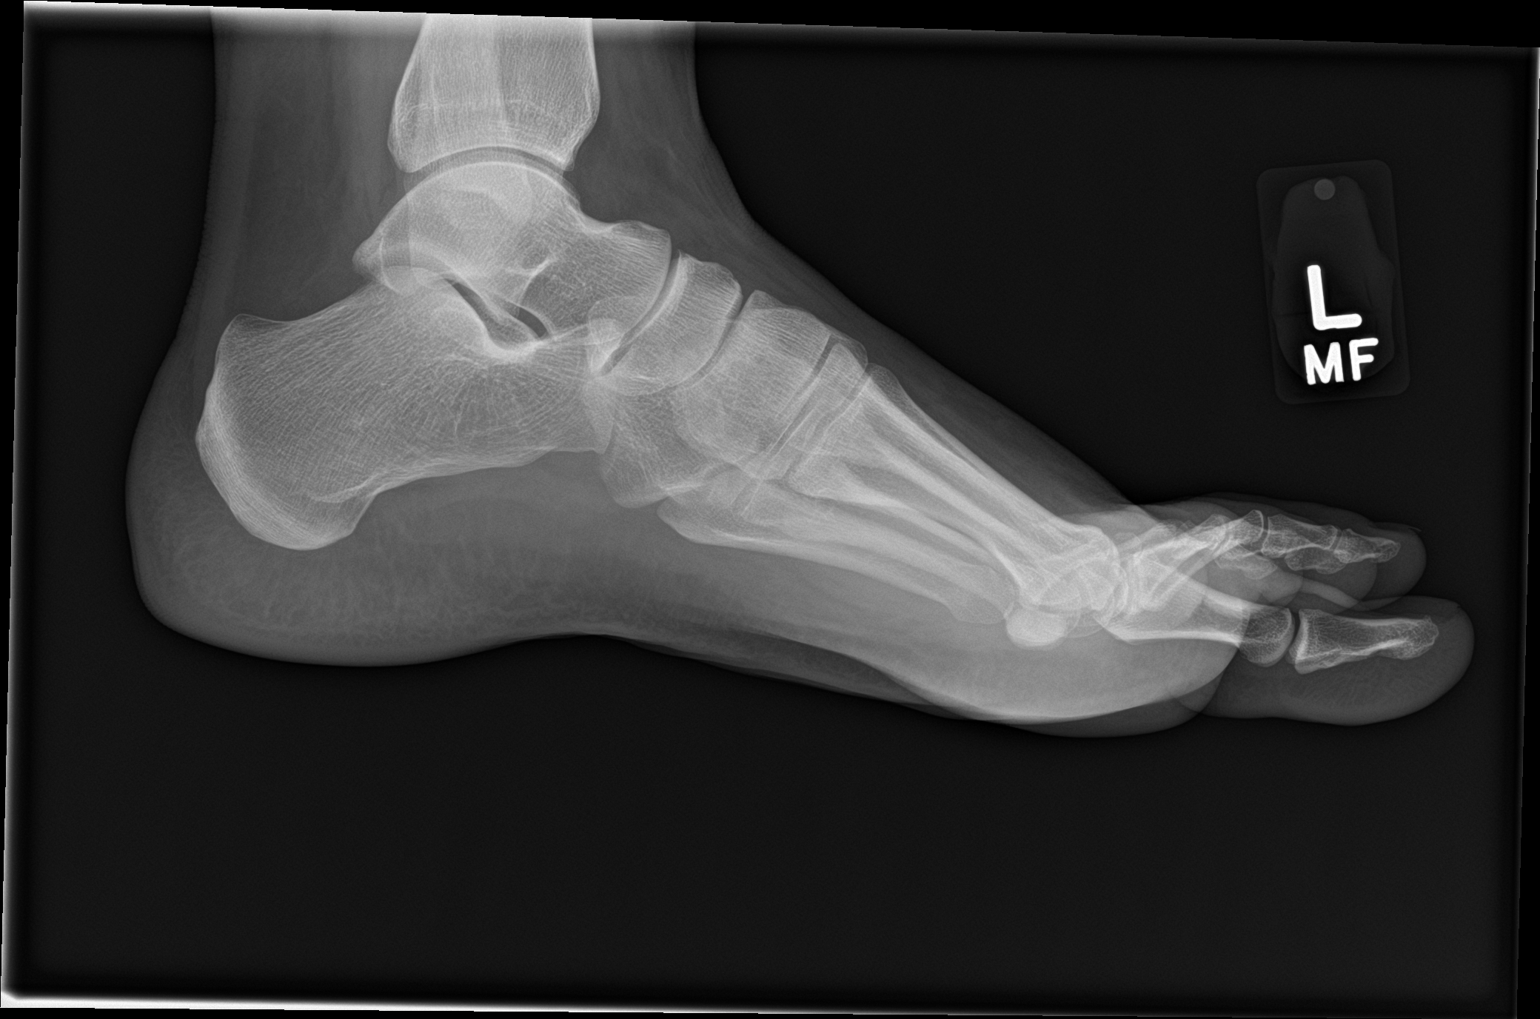

[3 of 3 positions shown; findings below may reference images not displayed]

FINDINGS: There is no evidence of fracture or dislocation. There is no
evidence of arthropathy or other focal bone abnormality. Soft
tissues are unremarkable.
IMPRESSION: No evidence of acute bony abnormality.

## 2015-07-17 MED FILL — TOPIRAMATE 50 MG TABLET: 50 | 30 days supply | Qty: 30 | Fill #0

## 2015-07-26 ENCOUNTER — Encounter: Payer: Self-pay | Admitting: Osteopathic Medicine

## 2015-07-27 ENCOUNTER — Other Ambulatory Visit: Payer: Self-pay | Admitting: Osteopathic Medicine

## 2015-07-27 ENCOUNTER — Encounter: Payer: Self-pay | Admitting: Osteopathic Medicine

## 2015-07-27 MED ORDER — LEVOFLOXACIN 500 MG PO TABS: 500.0000 mg | ORAL_TABLET | Freq: Every day | ORAL | Status: DC

## 2015-07-27 MED FILL — levoFLOXacin 500 MG TABS: 500 | 10 days supply | Qty: 10 | Fill #0

## 2015-07-29 DIAGNOSIS — M9902 Segmental and somatic dysfunction of thoracic region: Secondary | ICD-10-CM | POA: Diagnosis not present

## 2015-07-29 DIAGNOSIS — M9901 Segmental and somatic dysfunction of cervical region: Secondary | ICD-10-CM | POA: Diagnosis not present

## 2015-07-29 DIAGNOSIS — M25511 Pain in right shoulder: Secondary | ICD-10-CM | POA: Diagnosis not present

## 2015-07-29 DIAGNOSIS — M545 Low back pain: Secondary | ICD-10-CM | POA: Diagnosis not present

## 2015-07-29 DIAGNOSIS — M542 Cervicalgia: Secondary | ICD-10-CM | POA: Diagnosis not present

## 2015-07-29 DIAGNOSIS — M546 Pain in thoracic spine: Secondary | ICD-10-CM | POA: Diagnosis not present

## 2015-07-29 DIAGNOSIS — M9903 Segmental and somatic dysfunction of lumbar region: Secondary | ICD-10-CM | POA: Diagnosis not present

## 2015-07-29 DIAGNOSIS — M9907 Segmental and somatic dysfunction of upper extremity: Secondary | ICD-10-CM | POA: Diagnosis not present

## 2015-08-01 DIAGNOSIS — M542 Cervicalgia: Secondary | ICD-10-CM | POA: Diagnosis not present

## 2015-08-01 DIAGNOSIS — M9901 Segmental and somatic dysfunction of cervical region: Secondary | ICD-10-CM | POA: Diagnosis not present

## 2015-08-01 DIAGNOSIS — M546 Pain in thoracic spine: Secondary | ICD-10-CM | POA: Diagnosis not present

## 2015-08-11 ENCOUNTER — Encounter: Payer: Self-pay | Admitting: Osteopathic Medicine

## 2015-08-11 DIAGNOSIS — J329 Chronic sinusitis, unspecified: Secondary | ICD-10-CM

## 2015-08-12 ENCOUNTER — Ambulatory Visit (INDEPENDENT_AMBULATORY_CARE_PROVIDER_SITE_OTHER): Payer: 59

## 2015-08-12 DIAGNOSIS — J329 Chronic sinusitis, unspecified: Secondary | ICD-10-CM

## 2015-08-13 ENCOUNTER — Telehealth: Payer: Self-pay

## 2015-08-13 NOTE — Telephone Encounter (Signed)
I don't know about itching.  I never heard of that before

## 2015-08-13 NOTE — Telephone Encounter (Signed)
Is she taking anything over the counter for her headaches other than Zomig?  If she is taking pain relievers more than 2 days out of the week, this may cause rebound headache, which may the cause of head pressure.  Otherwise, we can increase topiramate.  The tingling is likely due to topamax.  Since topamax is helpful, I would like to continue this medication.  If the tingling really bothers her, we can switch it to the extended-release form, called Trokendi XR (it would be the same dose).

## 2015-08-13 NOTE — Telephone Encounter (Signed)
And what about the itching when in water? Pt states the itching occurs when  she sweats or is in water.   Pt is taking OTC NSAIDs nearly daily. Explained that she needed to reduce use to no more than 2 days a week. Advised pain may get worse before it gets better.

## 2015-08-13 NOTE — Telephone Encounter (Signed)
Please see message below

## 2015-08-13 NOTE — Telephone Encounter (Signed)
Pt started Topamax 50 mg back in the middle of May. Pt states that since shortly there after she has been experiencing daily pressure/sinus headaches. Pt has seen PCP many times for this issue, has completed 3 courses of antibiotics, had a CT to check her sinus (they were clear) - headaches have continued. Pt also complains of tingling in her fingers and toes; extreme itching when she is in water.   Pt stated that Topamax has appeared to help her migraines however, she still has had a few, but they were easily manageable with her Zomig. Please advise.

## 2015-08-14 MED ORDER — TOPIRAMATE ER 50 MG PO CAP24: 50.0000 mg | ORAL_CAPSULE | Freq: Every day | ORAL | 1 refills | Status: DC

## 2015-08-14 NOTE — Telephone Encounter (Signed)
Pt called back. Would like to try Trokendi. RX sent in.

## 2015-08-14 NOTE — Telephone Encounter (Signed)
Will call company to see if they've ever heard of the itching as a side effect.     1-800-JANSSEN [phone]), Monday through Friday from 9:00 AM to 8:00 PM ET.

## 2015-08-14 NOTE — Addendum Note (Signed)
Addended by: Gerda Diss A on: 08/14/2015 11:06 AM   Modules accepted: Orders

## 2015-08-14 NOTE — Telephone Encounter (Signed)
Called and spoke to [employer]. She stated that itching was listed as a side effect, but nothing stating that symptoms worsen or only occur in water. Left message on pt's vm.

## 2015-08-17 MED FILL — TROKENDI XR 50 MG CAPSULE: 50 | 30 days supply | Qty: 30 | Fill #0

## 2015-08-25 ENCOUNTER — Telehealth: Payer: Self-pay

## 2015-08-25 MED ORDER — ELETRIPTAN HYDROBROMIDE 40 MG PO TABS: 40.0000 mg | ORAL_TABLET | ORAL | 3 refills | Status: DC | PRN

## 2015-08-25 MED FILL — ELETRIPTAN HBR 40 MG TABLET: 40 | 30 days supply | Qty: 9 | Fill #0

## 2015-08-25 NOTE — Telephone Encounter (Signed)
40mg  is the highest dose of Relpax.  We can try a different medication such as Maxalt 10mg  (may repeat dose once in 2 hours if needed)

## 2015-08-25 NOTE — Telephone Encounter (Signed)
Relayed message to patient. Pt would like rx for Relpax. Afraid that other medication would be less effective, and she would have to go to hospital for treatment of Migraines. RX was sent to pharmacy.

## 2015-08-25 NOTE — Telephone Encounter (Signed)
Pt called to request that her Zomig be changed back to Relpax. Pt stated that she had migraine this weekend due to accidentally eating a trigger, but the Zomig was not helpful at all! Pt stated she found some old Relpax she had around the house. Pt also wants to know if there is a higher dose of Relpax other than 40 mg. Please advise.

## 2015-09-01 ENCOUNTER — Encounter: Payer: Self-pay | Admitting: Sports Medicine

## 2015-09-01 DIAGNOSIS — H02826 Cysts of left eye, unspecified eyelid: Secondary | ICD-10-CM

## 2015-09-08 ENCOUNTER — Encounter: Payer: Self-pay | Admitting: Neurology

## 2015-09-08 NOTE — Telephone Encounter (Signed)
Please see pt's mychart message. Currently on Trokendi 50 mg

## 2015-09-18 ENCOUNTER — Ambulatory Visit: Payer: 59 | Admitting: Family Medicine

## 2015-09-18 NOTE — Progress Notes (Deleted)
Subjective:    CC: Nausea, fatigue, stomach pain  HPI:   Recent labs in May show a normal CBC. Low vitamin D.   Chemistry      Component Value Date/Time   NA 140 06/03/2015 0937   K 4.7 06/03/2015 0937   CL 104 06/03/2015 0937   CO2 26 06/03/2015 0937   BUN 12 06/03/2015 0937   CREATININE 0.77 06/03/2015 0937      Component Value Date/Time   CALCIUM 9.2 06/03/2015 0937   ALKPHOS 57 06/03/2015 0937   AST 14 06/03/2015 0937   ALT 13 06/03/2015 0937   BILITOT 0.3 06/03/2015 0937     Lab Results  Component Value Date   TSH 3.31 06/03/2015   CBC Latest Ref Rng & Units 06/03/2015 02/05/2014 12/20/2013  WBC 3.8 - 10.8 K/uL 6.4 8.7 10.1  Hemoglobin 11.7 - 15.5 g/dL 12.2 12.1 13.2  Hematocrit 35.0 - 45.0 % 36.9 35.6(L) 40.2  Platelets 140 - 400 K/uL 296 331.0 347.0      Past medical history, Surgical history, Family history not pertinant except as noted below, Social history, Allergies, and medications have been entered into the medical record, reviewed, and corrections made.   Review of Systems: No fevers, chills, night sweats, weight loss, chest pain, or shortness of breath.   Objective:    General: Well Developed, well nourished, and in no acute distress.  Neuro: Alert and oriented x3, extra-ocular muscles intact, sensation grossly intact.  HEENT: Normocephalic, atraumatic  Skin: Warm and dry, no rashes. Cardiac: Regular rate and rhythm, no murmurs rubs or gallops, no lower extremity edema.  Respiratory: Clear to auscultation bilaterally. Not using accessory muscles, speaking in full sentences.   Impression and Recommendations:

## 2015-09-23 ENCOUNTER — Ambulatory Visit: Payer: 59 | Admitting: Neurology

## 2015-09-24 ENCOUNTER — Encounter: Payer: 59 | Admitting: Family Medicine

## 2015-09-28 ENCOUNTER — Ambulatory Visit: Payer: 59 | Admitting: Neurology

## 2015-09-29 ENCOUNTER — Encounter: Payer: Self-pay | Admitting: Osteopathic Medicine

## 2015-09-29 ENCOUNTER — Ambulatory Visit (INDEPENDENT_AMBULATORY_CARE_PROVIDER_SITE_OTHER): Payer: 59 | Admitting: Osteopathic Medicine

## 2015-09-29 VITALS — BP 110/70 | HR 70 | Ht 63.0 in | Wt 204.0 lb

## 2015-09-29 DIAGNOSIS — R1011 Right upper quadrant pain: Secondary | ICD-10-CM

## 2015-09-29 DIAGNOSIS — K8020 Calculus of gallbladder without cholecystitis without obstruction: Secondary | ICD-10-CM

## 2015-09-29 DIAGNOSIS — Z23 Encounter for immunization: Secondary | ICD-10-CM

## 2015-09-29 DIAGNOSIS — R109 Unspecified abdominal pain: Secondary | ICD-10-CM | POA: Diagnosis not present

## 2015-09-29 LAB — POCT URINE PREGNANCY: Preg Test, Ur: NEGATIVE

## 2015-09-29 NOTE — Progress Notes (Addendum)
HPI: Michelle Moody is a 30 y.o. female  who presents to [facility] today, 09/29/15,  for chief complaint of:  Chief Complaint  Patient presents with  . Abdominal Pain    . Context: no recent travel, no injury. No abdominal surgeries.  . Location: epigastric pain through into back . Quality: nauseous, low appetite, feels full . Duration: few weeks . Assoc signs/symptoms: fatigue   Past medical, surgical, social and family history reviewed: Past Medical History:  Diagnosis Date  . Anemia 07/06/2013  . Anxiety    2011  . Diverticulosis   . Elevated blood pressure   . Gallstones    02/2012  . History of dental surgery    2007  . Hyperlipidemia   . IBS (irritable bowel syndrome)   . Migraines   . Obese   . Post-operative nausea and vomiting   . Rectal bleeding    Past Surgical History:  Procedure Laterality Date  . CYST EXCISION     14 removed from scalp  . DENTAL SURGERY     Social History  Substance Use Topics  . Smoking status: Never Smoker  . Smokeless tobacco: Never Used  . Alcohol use No   Family History  Problem Relation Age of Onset  . Cancer Father 72    prostate  . Hyperlipidemia Father   . Hypertension Father   . Alzheimer's disease Father   . Dementia Father   . Cancer Maternal Grandmother     ovarian  . Alcohol abuse Maternal Grandfather   . Cancer Maternal Grandfather     lung- smoker  . Stroke Paternal Grandmother   . Alzheimer's disease Paternal Grandmother   . Dementia Paternal Grandmother   . Heart disease Paternal Grandfather   . Hyperlipidemia Paternal Grandfather   . Hypertension Paternal Grandfather   . Thyroid disease Mother   . Colon polyps Mother   . Hyperlipidemia Brother   . Cancer Paternal Aunt 43    colon  . Crohn's disease Paternal Uncle      Current medication list and allergy/intolerance information reviewed:   Current Outpatient Prescriptions  Medication Sig Dispense Refill  .  cholecalciferol (VITAMIN D) 1000 UNITS tablet Take 1,000 Units by mouth daily.    . Coenzyme Q10 (CO Q 10 PO) Take by mouth.    . eletriptan (RELPAX) 40 MG tablet Take 1 tablet (40 mg total) by mouth as needed for migraine or headache. May repeat in 2 hours if headache persists or recurs. 10 tablet 3  . Ferrous Sulfate (IRON) 325 (65 FE) MG TABS Take 1 tablet by mouth daily.    . Fish Oil-Cholecalciferol (FISH OIL + D3 PO) Take by mouth.    Marland Kitchen ipratropium (ATROVENT) 0.03 % nasal spray Place 2 sprays into both nostrils 3 (three) times daily as needed for rhinitis. 30 mL 0  . levofloxacin (LEVAQUIN) 500 MG tablet Take 1 tablet (500 mg total) by mouth daily. For 10 days. Call your doctor if no improvement after 10 days 10 tablet 0  . Magnesium 400 MG TABS Take 1 tablet by mouth daily.    . meloxicam (MOBIC) 7.5 MG tablet Take 1 tablet (7.5 mg total) by mouth daily. (Patient taking differently: Take 7.5 mg by mouth as needed. ) 14 tablet 0  . Omega-3 Fatty Acids (FISH OIL CONCENTRATE PO) Take by mouth.    Marland Kitchen OVER THE COUNTER MEDICATION BUTTERBUR. Take 1 capsule every morning for migraines.    . Prenatal  Vit-Fe Fumarate-FA (PRENATAL MULTIVITAMIN) TABS tablet Take 1 tablet by mouth daily at 12 noon.    . promethazine (PHENERGAN) 25 MG tablet Take 1 tablet (25 mg total) by mouth every 6 (six) hours as needed for nausea. 15 tablet 1  . Riboflavin 400 MG TABS Take by mouth.    . Topiramate ER 50 MG CP24 Take 50 mg by mouth daily. 30 capsule 1   No current facility-administered medications for this visit.    Allergies  Allergen Reactions  . Soy Allergy     Causes diarrhea  . Clindamycin/Lincomycin Nausea And Vomiting  . Latex Rash      Review of Systems:  Constitutional:  No  fever, no chills, No recent illness, No unintentional weight changes. (+)significant fatigue.   Cardiac: No  chest pain, No  pressure, No palpitations,  Respiratory:  No  shortness of breath.   Gastrointestinal:  (+)abdominal pain, (+)nausea, No  vomiting,  No  blood in stool, No  diarrhea, No  constipation   Musculoskeletal: No new myalgia/arthralgia  Skin: No  Rash,  Endocrine: No cold intolerance,  No heat intolerance.  Neurologic: No  weakness, No  dizziness,    Exam:  BP 110/70   Pulse 70   Ht _0  (1.6 m)   Wt 204 lb (92.5 kg)   BMI 36.14 kg/m   Constitutional: VS see above. General Appearance: alert, well-developed, well-nourished, NAD  Eyes: Normal lids and conjunctive, non-icteric sclera  Ears, Nose, Mouth, Throat: MMM, Normal external inspection ears/nares/mouth/lips/gums.   Neck: No masses, trachea midline. No thyroid enlargement. No tenderness/mass appreciated. No lymphadenopathy  Respiratory: Normal respiratory effort. no wheeze, no rhonchi, no rales  Cardiovascular: S1/S2 normal, no murmur, no rub/gallop auscultated. RRR.  Gastrointestinal: (+)RUQ tenderness, no masses. No hepatomegaly, no splenomegaly. No hernia appreciated. Bowel sounds normal. Rectal exam deferred.   Musculoskeletal: Gait normal. No clubbing/cyanosis of digits.    Skin: warm, dry, intact.   Psychiatric: Normal judgment/insight. Normal mood and affect. Oriented x3.    Results for orders placed or performed in visit on 09/29/15 (from the past 72 hour(s))  POCT urine pregnancy     Status: None   Collection Time: 09/29/15  4:36 PM  Result Value Ref Range   Preg Test, Ur Negative Negative  CBC with Differential/Platelet     Status: None   Collection Time: 09/29/15  4:50 PM  Result Value Ref Range   WBC 8.4 3.8 - 10.8 K/uL   RBC 4.56 3.80 - 5.10 MIL/uL   Hemoglobin 12.3 11.7 - 15.5 g/dL   HCT 37.4 35.0 - 45.0 %   MCV 82.0 80.0 - 100.0 fL   MCH 27.0 27.0 - 33.0 pg   MCHC 32.9 32.0 - 36.0 g/dL   RDW 14.5 11.0 - 15.0 %   Platelets 322 140 - 400 K/uL   MPV 10.3 7.5 - 12.5 fL   Neutro Abs 5,040 1,500 - 7,800 cells/uL   Lymphs Abs 2,688 850 - 3,900 cells/uL   Monocytes Absolute 588 200 - 950  cells/uL   Eosinophils Absolute 84 15 - 500 cells/uL   Basophils Absolute 0 0 - 200 cells/uL   Neutrophils Relative % 60 %   Lymphocytes Relative 32 %   Monocytes Relative 7 %   Eosinophils Relative 1 %   Basophils Relative 0 %   Smear Review Criteria for review not met   COMPLETE METABOLIC PANEL WITH GFR     Status: None   Collection Time: 09/29/15  4:50  PM  Result Value Ref Range   Sodium 139 135 - 146 mmol/L   Potassium 3.9 3.5 - 5.3 mmol/L   Chloride 104 98 - 110 mmol/L   CO2 24 20 - 31 mmol/L   Glucose, Bld 87 65 - 99 mg/dL   BUN 12 7 - 25 mg/dL   Creat 0.74 0.50 - 1.10 mg/dL   Total Bilirubin 0.5 0.2 - 1.2 mg/dL   Alkaline Phosphatase 57 33 - 115 U/L   AST 13 10 - 30 U/L   ALT 13 6 - 29 U/L   Total Protein 6.9 6.1 - 8.1 g/dL   Albumin 4.4 3.6 - 5.1 g/dL   Calcium 9.3 8.6 - 10.2 mg/dL   GFR, Est African American >89 >=60 mL/min   GFR, Est Non African American >89 >=60 mL/min  TSH     Status: None   Collection Time: 09/29/15  4:50 PM  Result Value Ref Range   TSH 3.96 mIU/L    Comment:   Reference Range   > or = 20 Years  0.40-4.50   Pregnancy Range First trimester  0.26-2.66 Second trimester 0.55-2.73 Third trimester  0.43-2.91     Lipase     Status: None   Collection Time: 09/29/15  4:50 PM  Result Value Ref Range   Lipase 16 7 - 60 U/L    US Abdomen Limited Ruq  Result Date: 10/01/2015 CLINICAL DATA:  Right upper quadrant pain with nausea, anorexia, full sensation for the past 2-3 weeks. History of gallstones. EXAM: US ABDOMEN LIMITED - RIGHT UPPER QUADRANT COMPARISON:  Abdominal and pelvic CT scan dated February 23, 2012 FINDINGS: Gallbladder: There is a mobile shadowing gallstone measuring 1.3 cm in diameter. There is no gallbladder wall thickening, pericholecystic fluid, or positive sonographic Murphy's sign. Common bile duct: Diameter: 1.8 mm Liver: The hepatic echotexture is normal. There is no focal mass or ductal dilation. IMPRESSION: Gallstones without  sonographic evidence of acute cholecystitis. Normal appearance of the liver and common bile duct. Electronically Signed   By: David  Martinique M.D.   On: 10/01/2015 12:25     ASSESSMENT/PLAN: Gallbladder possibility based on exam but pain and early satiety with lack of stool character/frequency change is concerning. Consider GI referral/CT based on [place] results, if GB consider gen surg refer   Right upper quadrant pain - Plan: POCT urine pregnancy, CBC with Differential/Platelet, COMPLETE METABOLIC PANEL WITH GFR, TSH, Lipase, US Abdomen Limited RUQ, Ambulatory referral to Gastroenterology, CANCELED: POCT Urinalysis Dipstick  Need for prophylactic vaccination and inoculation against influenza - Plan: Flu Vaccine QUAD 36+ mos IM  Gallstone    Patient Instructions  If severe pain, particularly if vomiting, fever, or other problems, please seek care ASAP. Will see what ultrasound shows, as well as lab results, and go from there - may need referral to GI.     Visit summary with medication list and pertinent instructions was printed for patient to review. All questions at time of visit were answered - patient instructed to contact office with any additional concerns. ER/RTC precautions were reviewed with the patient. Follow-up plan: Return if symptoms worsen or fail to improve and depending on results.

## 2015-09-29 NOTE — Patient Instructions (Signed)
If severe pain, particularly if vomiting, fever, or other problems, please seek care ASAP. Will see what ultrasound shows, as well as lab results, and go from there - may need referral to GI.

## 2015-09-30 LAB — CBC WITH DIFFERENTIAL/PLATELET
BASOS PCT: 0 %
Basophils Absolute: 0 cells/uL (ref 0–200)
EOS ABS: 84 {cells}/uL (ref 15–500)
Eosinophils Relative: 1 %
HCT: 37.4 % (ref 35.0–45.0)
Hemoglobin: 12.3 g/dL (ref 11.7–15.5)
LYMPHS PCT: 32 %
Lymphs Abs: 2688 cells/uL (ref 850–3900)
MCH: 27 pg (ref 27.0–33.0)
MCHC: 32.9 g/dL (ref 32.0–36.0)
MCV: 82 fL (ref 80.0–100.0)
MONOS PCT: 7 %
MPV: 10.3 fL (ref 7.5–12.5)
Monocytes Absolute: 588 cells/uL (ref 200–950)
NEUTROS PCT: 60 %
Neutro Abs: 5040 cells/uL (ref 1500–7800)
PLATELETS: 322 10*3/uL (ref 140–400)
RBC: 4.56 MIL/uL (ref 3.80–5.10)
RDW: 14.5 % (ref 11.0–15.0)
WBC: 8.4 10*3/uL (ref 3.8–10.8)

## 2015-09-30 LAB — COMPLETE METABOLIC PANEL WITH GFR
ALT: 13 U/L (ref 6–29)
AST: 13 U/L (ref 10–30)
Albumin: 4.4 g/dL (ref 3.6–5.1)
Alkaline Phosphatase: 57 U/L (ref 33–115)
BUN: 12 mg/dL (ref 7–25)
CALCIUM: 9.3 mg/dL (ref 8.6–10.2)
CHLORIDE: 104 mmol/L (ref 98–110)
CO2: 24 mmol/L (ref 20–31)
Creat: 0.74 mg/dL (ref 0.50–1.10)
Glucose, Bld: 87 mg/dL (ref 65–99)
POTASSIUM: 3.9 mmol/L (ref 3.5–5.3)
Sodium: 139 mmol/L (ref 135–146)
Total Bilirubin: 0.5 mg/dL (ref 0.2–1.2)
Total Protein: 6.9 g/dL (ref 6.1–8.1)

## 2015-09-30 LAB — LIPASE: Lipase: 16 U/L (ref 7–60)

## 2015-09-30 LAB — TSH: TSH: 3.96 m[IU]/L

## 2015-10-01 ENCOUNTER — Ambulatory Visit (INDEPENDENT_AMBULATORY_CARE_PROVIDER_SITE_OTHER): Payer: 59

## 2015-10-01 DIAGNOSIS — K8080 Other cholelithiasis without obstruction: Secondary | ICD-10-CM | POA: Diagnosis not present

## 2015-10-01 DIAGNOSIS — K8020 Calculus of gallbladder without cholecystitis without obstruction: Secondary | ICD-10-CM | POA: Diagnosis not present

## 2015-10-01 NOTE — Addendum Note (Signed)
Addended by: Maryla Morrow on: 10/01/2015 04:04 PM   Modules accepted: Orders

## 2015-10-22 ENCOUNTER — Ambulatory Visit (INDEPENDENT_AMBULATORY_CARE_PROVIDER_SITE_OTHER): Payer: 59 | Admitting: Neurology

## 2015-10-22 ENCOUNTER — Encounter: Payer: Self-pay | Admitting: Neurology

## 2015-10-22 VITALS — BP 116/84 | HR 85 | Ht 63.0 in | Wt 204.0 lb

## 2015-10-22 DIAGNOSIS — G44229 Chronic tension-type headache, not intractable: Secondary | ICD-10-CM | POA: Diagnosis not present

## 2015-10-22 DIAGNOSIS — H538 Other visual disturbances: Secondary | ICD-10-CM

## 2015-10-22 DIAGNOSIS — M542 Cervicalgia: Secondary | ICD-10-CM

## 2015-10-22 DIAGNOSIS — R413 Other amnesia: Secondary | ICD-10-CM

## 2015-10-22 DIAGNOSIS — G43839 Menstrual migraine, intractable, without status migrainosus: Secondary | ICD-10-CM | POA: Diagnosis not present

## 2015-10-22 DIAGNOSIS — H539 Unspecified visual disturbance: Secondary | ICD-10-CM

## 2015-10-22 DIAGNOSIS — M5412 Radiculopathy, cervical region: Secondary | ICD-10-CM

## 2015-10-22 DIAGNOSIS — R51 Headache: Secondary | ICD-10-CM

## 2015-10-22 DIAGNOSIS — R519 Headache, unspecified: Secondary | ICD-10-CM

## 2015-10-22 MED ORDER — KETOROLAC TROMETHAMINE 30 MG/ML IJ SOLN
60.0000 mg | Freq: Once | INTRAMUSCULAR | Status: AC
  Administered 2015-10-22: 60 mg via INTRAMUSCULAR

## 2015-10-22 MED ORDER — FROVATRIPTAN SUCCINATE 2.5 MG PO TABS: ORAL_TABLET | ORAL | 0 refills | Status: DC

## 2015-10-22 MED ORDER — METOCLOPRAMIDE HCL 5 MG/ML IJ SOLN
10.0000 mg | Freq: Once | INTRAVENOUS | Status: AC
  Administered 2015-10-22: 10 mg via INTRAMUSCULAR

## 2015-10-22 MED ORDER — TIZANIDINE HCL 2 MG PO TABS: 2.0000 mg | ORAL_TABLET | Freq: Four times a day (QID) | ORAL | 0 refills | Status: DC | PRN

## 2015-10-22 MED ORDER — NORTRIPTYLINE HCL 25 MG PO CAPS: 25.0000 mg | ORAL_CAPSULE | Freq: Every day | ORAL | 3 refills | Status: DC
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
## 2015-10-22 NOTE — Patient Instructions (Addendum)
1.  We will start nortriptyline 25mg  at bedtime.  Contact me in 4 weeks with update. 2.  Take tizanidine 2mg  up to every 6 hours as needed for tension headache or neck pain. 3.  Stop Relpax. 4.  We will give you a headache cocktail to break this migraine.  If ineffective, we can prescribe you a prednisone taper. 5.  Next month, take frovatriptan 2.5mg  twice daily beginning 2 days prior to expected migraine and daily for total of 6 days. 6.  See Dr. Katy Fitch 7.  We will check MRI of brain with and without contrast to assess worsening headache and visual problems. 8.  We will check MRI of cervical spine to evaluate neck pain and arm pain 9.  Use non-contoured memory foam pillow at night.  Sleep in neutral position. 10.  Follow up in 3 months.

## 2015-10-26 ENCOUNTER — Ambulatory Visit (INDEPENDENT_AMBULATORY_CARE_PROVIDER_SITE_OTHER): Payer: 59

## 2015-10-26 ENCOUNTER — Telehealth: Payer: Self-pay

## 2015-10-26 DIAGNOSIS — M542 Cervicalgia: Secondary | ICD-10-CM

## 2015-10-26 DIAGNOSIS — H539 Unspecified visual disturbance: Secondary | ICD-10-CM

## 2015-10-26 DIAGNOSIS — L7211 Pilar cyst: Secondary | ICD-10-CM | POA: Diagnosis not present

## 2015-10-26 DIAGNOSIS — R51 Headache: Secondary | ICD-10-CM

## 2015-10-26 DIAGNOSIS — M5412 Radiculopathy, cervical region: Secondary | ICD-10-CM | POA: Diagnosis not present

## 2015-10-26 DIAGNOSIS — R519 Headache, unspecified: Secondary | ICD-10-CM

## 2015-10-26 MED ORDER — GADOBENATE DIMEGLUMINE 529 MG/ML IV SOLN
20.0000 mL | Freq: Once | INTRAVENOUS | Status: AC | PRN
  Administered 2015-10-26: 19 mL via INTRAVENOUS

## 2015-10-26 NOTE — Telephone Encounter (Signed)
Sent via mychart

## 2015-10-26 NOTE — Telephone Encounter (Signed)
-----   Message from Pieter Partridge, DO sent at 10/26/2015  1:58 PM EDT ----- Both MRI of brain and cervical spine look okay.  I would recommend PT of neck to address neck pain which may be contributing to headache as well.

## 2015-11-10 ENCOUNTER — Encounter: Payer: Self-pay | Admitting: Neurology

## 2015-11-10 MED FILL — FROVATRIPTAN SUCC 2.5 MG TA: 2.5 | 30 days supply | Qty: 9 | Fill #0

## 2015-11-10 MED FILL — tiZANidine HCL 2 MG TABS: 2 | 8 days supply | Qty: 30 | Fill #0

## 2015-11-10 MED FILL — NORTRIPTYLINE HCL 25 MG CAP: 25 | 30 days supply | Qty: 30 | Fill #0

## 2015-11-11 ENCOUNTER — Other Ambulatory Visit: Payer: Self-pay | Admitting: Neurology

## 2015-11-11 ENCOUNTER — Telehealth: Payer: Self-pay | Admitting: Neurology

## 2015-11-11 MED ORDER — NAPROXEN SODIUM 550 MG PO TABS
ORAL_TABLET | ORAL | 0 refills | Status: DC
Start: 2015-11-11 — End: 2017-07-31

## 2015-11-11 NOTE — Telephone Encounter (Signed)
Discussed w/ provider via mychart.

## 2015-11-11 NOTE — Telephone Encounter (Signed)
PT called and said she is having a headache and needs to know what to do, she has no medication/Dawn CB# [phone]

## 2015-11-23 MED FILL — NAPROXEN SODIUM 550 MG TAB: 550 | 7 days supply | Qty: 15 | Fill #0

## 2015-12-03 ENCOUNTER — Encounter: Payer: Self-pay | Admitting: Osteopathic Medicine

## 2015-12-22 DIAGNOSIS — Z6839 Body mass index (BMI) 39.0-39.9, adult: Secondary | ICD-10-CM | POA: Diagnosis not present

## 2015-12-22 DIAGNOSIS — Z319 Encounter for procreative management, unspecified: Secondary | ICD-10-CM | POA: Diagnosis not present

## 2015-12-22 DIAGNOSIS — Z01419 Encounter for gynecological examination (general) (routine) without abnormal findings: Secondary | ICD-10-CM | POA: Diagnosis not present

## 2015-12-22 DIAGNOSIS — Z124 Encounter for screening for malignant neoplasm of cervix: Secondary | ICD-10-CM | POA: Diagnosis not present

## 2015-12-23 ENCOUNTER — Other Ambulatory Visit: Payer: Self-pay

## 2015-12-23 ENCOUNTER — Ambulatory Visit (INDEPENDENT_AMBULATORY_CARE_PROVIDER_SITE_OTHER): Payer: 59 | Admitting: Physician Assistant

## 2015-12-23 ENCOUNTER — Encounter: Payer: Self-pay | Admitting: Physician Assistant

## 2015-12-23 ENCOUNTER — Ambulatory Visit (INDEPENDENT_AMBULATORY_CARE_PROVIDER_SITE_OTHER): Payer: 59

## 2015-12-23 VITALS — BP 117/72 | HR 75 | Ht 63.0 in | Wt 203.0 lb

## 2015-12-23 DIAGNOSIS — R918 Other nonspecific abnormal finding of lung field: Secondary | ICD-10-CM

## 2015-12-23 DIAGNOSIS — J189 Pneumonia, unspecified organism: Secondary | ICD-10-CM

## 2015-12-23 DIAGNOSIS — R059 Cough, unspecified: Secondary | ICD-10-CM

## 2015-12-23 DIAGNOSIS — R05 Cough: Secondary | ICD-10-CM | POA: Diagnosis not present

## 2015-12-23 DIAGNOSIS — J0100 Acute maxillary sinusitis, unspecified: Secondary | ICD-10-CM

## 2015-12-23 DIAGNOSIS — J181 Lobar pneumonia, unspecified organism: Secondary | ICD-10-CM

## 2015-12-23 MED ORDER — AZITHROMYCIN 250 MG PO TABS: ORAL_TABLET | ORAL | 0 refills | Status: DC

## 2015-12-23 MED ORDER — FROVATRIPTAN SUCCINATE 2.5 MG PO TABS: ORAL_TABLET | ORAL | 0 refills | Status: AC

## 2015-12-23 MED ORDER — PREDNISONE 50 MG PO TABS: ORAL_TABLET | ORAL | 0 refills | Status: DC

## 2015-12-23 MED ORDER — ALBUTEROL SULFATE HFA 108 (90 BASE) MCG/ACT IN AERS: 2.0000 | INHALATION_SPRAY | Freq: Four times a day (QID) | RESPIRATORY_TRACT | 1 refills | Status: DC | PRN

## 2015-12-23 MED FILL — FROVATRIPTAN SUCC 2.5 MG TA: 2.5 | 90 days supply | Qty: 27 | Fill #0

## 2015-12-23 NOTE — Progress Notes (Addendum)
   Subjective:    Patient ID: Michelle Moody, female    DOB: September 20, 1985, 30 y.o.   MRN: [id number]  HPI Patient is a 30 yo female coming to the office today with complaints of a cough that began at the end of October of this year. She reports that her child was sick, then she acquired the infection. She reports that it sounds like rice krispies when she breathes. Her cough is mainly dry but when it is productive she coughs up yellowish phlegm. Her cough is worse at night. She reports some rhinorrhea. She took Theraflu when the cough started a month ago with no relief. She reports that the cough has been persistent and has progressed. Yesterday, she began experiencing pain in her sinuses along with a headache and pain in upper teeth on right side. She reports feeling fatigued. She denies no fever, shortness of breath and chest pain. Patient reports that she had exercised induced asthmas as a child.   Review of Systems  Constitutional: Positive for fatigue. Negative for chills and fever.  HENT: Positive for congestion, rhinorrhea and sinus pressure.   Respiratory: Positive for cough.   Cardiovascular: Negative for chest pain and palpitations.       Objective: Blood pressure 117/72, pulse 75, height 5\' 3"  (1.6 m), weight 203 lb (92.1 kg), SpO2 100 %.   Physical Exam  Constitutional: She is oriented to person, place, and time. She appears well-developed and well-nourished. No distress.  Patient is coughing during exam.   HENT:  Right Ear: External ear normal.  Left Ear: External ear normal.  Nose: Nose normal.  Oropharynx erythematous without exudate. Maxillary and frontal sinuses tender to palpation. Unable to transilluminate maxillary or frontal sinuses.   Cardiovascular: Normal rate, regular rhythm and normal heart sounds.  Exam reveals no gallop and no friction rub.   No murmur heard. Pulmonary/Chest:  When patient inhales, she coughs. Rales auscultated on left upper lung field.    Lymphadenopathy:    She has no cervical adenopathy.  Neurological: She is alert and oriented to person, place, and time.  Psychiatric: She has a normal mood and affect. Her behavior is normal.   Chest x-ray impression: Faint infiltrate LEFT upper lobe is new from priors. Recommend repeat radiograph in 4-6 weeks to ensure clearing.    Assessment & Plan:  Michelle Moody was seen today for cough.  Diagnoses and all orders for this visit:  1. Community acquired pneumonia of left upper lobe of lung ([place])- Cough for one month 2. Acute non-recurrent maxillary sinusitis due to sinus pressure         -DG Chest 2 View due to patient cough for one month. Bronchitis vs pneumonia. Chest              x-ray showed a small infiltrate in right upper lobe. Follow-up in four weeks to repeat chest          x-ray and to be re-evaluated. -PredniSONE (DELTASONE) 50 MG tablet; Take one tablet for 5 days for acute inflammation. -Azithromycin (ZITHROMAX) 250 MG tablet; Take 2 tablets now and then one tablet for 4 days to treat pneumonia and sinusitis.  -Albuterol (PROVENTIL HFA;VENTOLIN HFA) 108 (90 Base) MCG/ACT inhaler; Inhale 2 puffs into the lungs every 6 (six) hours as needed for wheezing or shortness of breath.

## 2015-12-23 NOTE — Patient Instructions (Signed)

## 2015-12-24 MED FILL — VENTOLIN HFA 90 MCG INHALER: 108 (90 BAS | 30 days supply | Qty: 18 | Fill #0

## 2015-12-24 MED FILL — AZITHROMYCIN 250 MG TABLET: 250 | 5 days supply | Qty: 6 | Fill #0

## 2015-12-24 MED FILL — predniSONE 50 MG TABS: 50 | 5 days supply | Qty: 5 | Fill #0

## 2015-12-31 ENCOUNTER — Ambulatory Visit (INDEPENDENT_AMBULATORY_CARE_PROVIDER_SITE_OTHER): Payer: 59 | Admitting: Sports Medicine

## 2015-12-31 ENCOUNTER — Encounter: Payer: Self-pay | Admitting: Sports Medicine

## 2015-12-31 DIAGNOSIS — D234 Other benign neoplasm of skin of scalp and neck: Secondary | ICD-10-CM | POA: Diagnosis not present

## 2015-12-31 NOTE — Assessment & Plan Note (Signed)
#  3 sebaceous cysts removed from the scalp and closed primarily.  Return in one week for suture removal.

## 2015-12-31 NOTE — Progress Notes (Signed)
   Procedure: Excision of three scalp sebaceous cysts, 3 cm, 3cm, 3cm. Risks, benefits, and alternatives explained and consent obtained. Time out conducted. Surface prepped with chlorhexidine. A total of 5cc lidocaine with epinephine infiltrated in a field block around each one of the sebaceous cysts. Adequate anesthesia ensured. Area prepped and draped in a sterile fashion. Incision made with #15 blade. Using blunt dissection and sharp dissection, each of the sebaceous cysts were removed en bloc. Using of 3-0 Ethilon suture in a simple interrupted pattern I closed each of the incisions. Hemostasis achieved.

## 2016-01-07 ENCOUNTER — Ambulatory Visit (INDEPENDENT_AMBULATORY_CARE_PROVIDER_SITE_OTHER): Payer: 59 | Admitting: Sports Medicine

## 2016-01-07 ENCOUNTER — Encounter: Payer: Self-pay | Admitting: Sports Medicine

## 2016-01-07 DIAGNOSIS — D234 Other benign neoplasm of skin of scalp and neck: Secondary | ICD-10-CM | POA: Diagnosis not present

## 2016-01-07 NOTE — Assessment & Plan Note (Signed)
3 more sebaceous cysts excised from the scalp. Also remove the sutures from the previous 3. Return in one week for suture removal.

## 2016-01-07 NOTE — Progress Notes (Signed)
   Procedure:Excision of three scalp sebaceous cysts, 3 cm, 3cm, 3cm. Risks, benefits, and alternatives explained and consent obtained. Time out conducted. Surface prepped with chlorhexidine. A total of 5cc lidocaine with epinephine infiltrated in a field block around each one of the sebaceous cysts. Adequate anesthesia ensured. Area prepped and draped in a sterile fashion. Incision made with #15 blade. Using blunt dissection and sharp dissection, each of the sebaceous cysts were removed en bloc. Using of 3-0 Ethilon suture in a simple interrupted pattern I closed each of the incisions. Hemostasis achieved.  I also removed sutures from the previous 3 sebaceous cysts excised.

## 2016-01-15 ENCOUNTER — Ambulatory Visit (INDEPENDENT_AMBULATORY_CARE_PROVIDER_SITE_OTHER): Payer: 59

## 2016-01-15 ENCOUNTER — Ambulatory Visit (INDEPENDENT_AMBULATORY_CARE_PROVIDER_SITE_OTHER): Payer: 59 | Admitting: Physician Assistant

## 2016-01-15 ENCOUNTER — Encounter: Payer: Self-pay | Admitting: Physician Assistant

## 2016-01-15 VITALS — BP 122/65 | HR 91 | Temp 98.4°F | Wt 210.0 lb

## 2016-01-15 DIAGNOSIS — Z8701 Personal history of pneumonia (recurrent): Secondary | ICD-10-CM

## 2016-01-15 DIAGNOSIS — J8410 Pulmonary fibrosis, unspecified: Secondary | ICD-10-CM | POA: Diagnosis not present

## 2016-01-15 DIAGNOSIS — Z4802 Encounter for removal of sutures: Secondary | ICD-10-CM | POA: Diagnosis not present

## 2016-01-15 DIAGNOSIS — R079 Chest pain, unspecified: Secondary | ICD-10-CM | POA: Diagnosis not present

## 2016-01-15 NOTE — Progress Notes (Signed)
HPI:                                                                Michelle Moody is a 30 y.o. female who presents to [facility] today for pneumonia followup and suture removal  Patient was diagnosed with CAP of the LUL on 12/23/15 and treated with Azithromycin and Prednisone burst. She completed antibiotic therapy without complications. She endorses congestion, rhinorrhea, and intermittent cough, but otherwise feels improved. Denies fever, chills, malaise, or fatigue. Denies chest pain or palpitations. She is requesting a chest xray for resolution today. She is concerned because she is planning on trying to conceive a pregnancy and would like to make sure she is healthy.  Additionally she is requesting suture removal from three sites on her scalp where she had sebaceous cysts excised by Dr. Dianah Field on 12/14.  Health Maintenance Health Maintenance  Topic Date Due  . PAP SMEAR  09/20/2015  . TETANUS/TDAP  09/20/2022  . INFLUENZA VACCINE  Completed  . HIV Screening  Completed    Past Medical History:  Diagnosis Date  . Anemia 07/06/2013  . Anxiety    2011  . Diverticulosis   . Elevated blood pressure   . Gallstones    02/2012  . History of dental surgery    2007  . Hyperlipidemia   . IBS (irritable bowel syndrome)   . Migraines   . Obese   . Post-operative nausea and vomiting   . Rectal bleeding    Past Surgical History:  Procedure Laterality Date  . CYST EXCISION     14 removed from scalp  . DENTAL SURGERY     Social History  Substance Use Topics  . Smoking status: Never Smoker  . Smokeless tobacco: Never Used  . Alcohol use No   family history includes Alcohol abuse in her maternal grandfather; Alzheimer's disease in her father and paternal grandmother; Cancer in her maternal grandfather and maternal grandmother; Cancer (age of onset: 76) in her paternal aunt; Cancer (age of onset: 62) in her father; Colon polyps  in her mother; Crohn's disease in her paternal uncle; Dementia in her father and paternal grandmother; Heart disease in her paternal grandfather; Hyperlipidemia in her brother, father, and paternal grandfather; Hypertension in her father and paternal grandfather; Stroke in her paternal grandmother; Thyroid disease in her mother.  ROS: negative except as noted in the HPI  Medications: Current Outpatient Prescriptions  Medication Sig Dispense Refill  . albuterol (PROVENTIL HFA;VENTOLIN HFA) 108 (90 Base) MCG/ACT inhaler Inhale 2 puffs into the lungs every 6 (six) hours as needed for wheezing or shortness of breath. 1 Inhaler 1  . cholecalciferol (VITAMIN D) 1000 UNITS tablet Take 1,000 Units by mouth daily.    . Coenzyme Q10 (CO Q 10 PO) Take by mouth.    . Ferrous Sulfate (IRON) 325 (65 FE) MG TABS Take 1 tablet by mouth daily.    . Fish Oil-Cholecalciferol (FISH OIL + D3 PO) Take by mouth.    . frovatriptan (FROVA) 2.5 MG tablet Take 1 tab BID starting 2 days prior to onset of expected migraine and for total of 6 days. 36 tablet 0  . ipratropium (ATROVENT) 0.03 % nasal spray Place 2 sprays into  both nostrils 3 (three) times daily as needed for rhinitis. 30 mL 0  . Magnesium 400 MG TABS Take 1 tablet by mouth daily.    . meloxicam (MOBIC) 7.5 MG tablet Take 1 tablet (7.5 mg total) by mouth daily. (Patient taking differently: Take 7.5 mg by mouth as needed. ) 14 tablet 0  . naproxen sodium (ANAPROX) 550 MG tablet Take 1 tablet for migraine.  May repeat in 12 hours if needed. 15 tablet 0  . Omega-3 Fatty Acids (FISH OIL CONCENTRATE PO) Take by mouth.    Marland Kitchen OVER THE COUNTER MEDICATION BUTTERBUR. Take 1 capsule every morning for migraines.    . Prenatal Vit-Fe Fumarate-FA (PRENATAL MULTIVITAMIN) TABS tablet Take 1 tablet by mouth daily at 12 noon.    . promethazine (PHENERGAN) 25 MG tablet Take 1 tablet (25 mg total) by mouth every 6 (six) hours as needed for nausea. 15 tablet 1  . Riboflavin 400 MG  TABS Take by mouth.    Marland Kitchen tiZANidine (ZANAFLEX) 2 MG tablet Take 1 tablet (2 mg total) by mouth every 6 (six) hours as needed for muscle spasms. 30 tablet 0   No current facility-administered medications for this visit.    Allergies  Allergen Reactions  . Soy Allergy     Causes diarrhea  . Clindamycin/Lincomycin Nausea And Vomiting  . Latex Rash     Objective:  BP 122/65   Pulse 91   Temp 98.4 F (36.9 C) (Oral)   Wt 210 lb (95.3 kg)   LMP 01/11/2016   BMI 37.20 kg/m  Gen: well-groomed, cooperative, not ill-appearing, no distress HEENT: normal conjunctiva, oropharynx clear, moist mucus membranes Lungs: Normal work of breathing, clear to auscultation bilaterally, no wheezes, rales or rhonchi  Heart: Normal rate, regular rhythm, s1 and s2 distinct, grade II/VI pulmonic ejection murmur Skin: warm and dry, no rashes or lesions on exposed skin Psych: normal affect, pleasant mood, normal speech and thought content   Dg Chest 2 View  Result Date: 01/15/2016 CLINICAL DATA:  Follow up x-rays. Pt states she was dx with pna on December 6th. States she is feeling better now and does not have any chest pain or sob. EXAM: CHEST  2 VIEW COMPARISON:  12/23/2015 FINDINGS: Cardiomediastinal silhouette is normal. The lungs are free of focal consolidations and pleural effusions. Interval resolution of left pulmonary infiltrate. Probable calcified granuloma within the right upper lobe. Visualized osseous structures have a normal appearance. IMPRESSION: No evidence for acute cardiopulmonary abnormality. Electronically Signed   By: Nolon Nations M.D.   On: 01/15/2016 15:41      Assessment and Plan: 30 y.o. female with recent history of CAP that is now resolved  1. History of pneumonia - DG Chest 2 View - CXR today showed resolution of left pulmonary infiltrate. A RUL granuloma was also noted incidentally. I reviewed previous CXR on 05/15/15, which also showed the same finding. Stable. No  follow-up imaging recommendations from Radiology.  2. Visit for suture removal - removed 3 sutures from the scalp without complication  Patient education and anticipatory guidance given Patient agrees with treatment plan Follow-up as needed if symptoms worsen or fail to improve  Darlyne Russian PA-C

## 2016-01-15 NOTE — Patient Instructions (Signed)
Community-Acquired Pneumonia, Adult °Pneumonia is an infection of the lungs. There are different types of pneumonia. One type can develop while a person is in a hospital. A different type, called community-acquired pneumonia, develops in people who are not, or have not recently been, in the hospital or other health care facility. °What are the causes? °Pneumonia may be caused by bacteria, viruses, or funguses. Community-acquired pneumonia is often caused by Streptococcus pneumonia bacteria. These bacteria are often passed from one person to another by breathing in droplets from the cough or sneeze of an infected person. °What increases the risk? °The condition is more likely to develop in: °· People who have chronic diseases, such as chronic obstructive pulmonary disease (COPD), asthma, congestive heart failure, cystic fibrosis, diabetes, or kidney disease. °· People who have early-stage or late-stage HIV. °· People who have sickle cell disease. °· People who have had their spleen removed (splenectomy). °· People who have poor dental hygiene. °· People who have medical conditions that increase the risk of breathing in (aspirating) secretions their own mouth and nose. °· People who have a weakened immune system (immunocompromised). °· People who smoke. °· People who travel to areas where pneumonia-causing germs commonly exist. °· People who are around animal habitats or animals that have pneumonia-causing germs, including birds, bats, rabbits, cats, and farm animals. ° °What are the signs or symptoms? °Symptoms of this condition include: °· A dry cough. °· A wet (productive) cough. °· Fever. °· Sweating. °· Chest pain, especially when breathing deeply or coughing. °· Rapid breathing or difficulty breathing. °· Shortness of breath. °· Shaking chills. °· Fatigue. °· Muscle aches. ° °How is this diagnosed? °Your health care provider will take a medical history and perform a physical exam. You may also have other tests,  including: °· Imaging studies of your chest, including X-rays. °· Tests to check your blood oxygen level and other blood gases. °· Other tests on blood, mucus (sputum), fluid around your lungs (pleural fluid), and urine. ° °If your pneumonia is severe, other tests may be done to identify the specific cause of your illness. °How is this treated? °The type of treatment that you receive depends on many factors, such as the cause of your pneumonia, the medicines you take, and other medical conditions that you have. For most adults, treatment and recovery from pneumonia may occur at home. In some cases, treatment must happen in a hospital. Treatment may include: °· Antibiotic medicines, if the pneumonia was caused by bacteria. °· Antiviral medicines, if the pneumonia was caused by a virus. °· Medicines that are given by mouth or through an IV tube. °· Oxygen. °· Respiratory therapy. ° °Although rare, treating severe pneumonia may include: °· Mechanical ventilation. This is done if you are not breathing well on your own and you cannot maintain a safe blood oxygen level. °· Thoracentesis. This procedure removes fluid around one lung or both lungs to help you breathe better. ° °Follow these instructions at home: °· Take over-the-counter and prescription medicines only as told by your health care provider. °? Only take cough medicine if you are losing sleep. Understand that cough medicine can prevent your body’s natural ability to remove mucus from your lungs. °? If you were prescribed an antibiotic medicine, take it as told by your health care provider. Do not stop taking the antibiotic even if you start to feel better. °· Sleep in a semi-upright position at night. Try sleeping in a reclining chair, or place a few pillows under your head. °· Do not use tobacco products, including cigarettes, chewing   tobacco, and e-cigarettes. If you need help quitting, ask your health care provider. °· Drink enough water to keep your urine  clear or pale yellow. This will help to thin out mucus secretions in your lungs. °How is this prevented? °There are ways that you can decrease your risk of developing community-acquired pneumonia. Consider getting a pneumococcal vaccine if: °· You are older than 31 years of age. °· You are older than 31 years of age and are undergoing cancer treatment, have chronic lung disease, or have other medical conditions that affect your immune system. Ask your health care provider if this applies to you. ° °There are different types and schedules of pneumococcal vaccines. Ask your health care provider which vaccination option is best for you. °You may also prevent community-acquired pneumonia if you take these actions: °· Get an influenza vaccine every year. Ask your health care provider which type of influenza vaccine is best for you. °· Go to the dentist on a regular basis. °· Wash your hands often. Use hand sanitizer if soap and water are not available. ° °Contact a health care provider if: °· You have a fever. °· You are losing sleep because you cannot control your cough with cough medicine. °Get help right away if: °· You have worsening shortness of breath. °· You have increased chest pain. °· Your sickness becomes worse, especially if you are an older adult or have a weakened immune system. °· You cough up blood. °This information is not intended to replace advice given to you by your health care provider. Make sure you discuss any questions you have with your health care provider. °Document Released: 01/03/2005 Document Revised: 05/14/2015 Document Reviewed: 04/30/2014 °Elsevier Interactive Patient Education © 2017 [employer] ° °

## 2016-01-20 ENCOUNTER — Ambulatory Visit (INDEPENDENT_AMBULATORY_CARE_PROVIDER_SITE_OTHER): Payer: 59 | Admitting: Internal Medicine

## 2016-01-20 ENCOUNTER — Encounter: Payer: Self-pay | Admitting: Internal Medicine

## 2016-01-20 VITALS — BP 128/74 | HR 88 | Ht 63.0 in | Wt 211.1 lb

## 2016-01-20 DIAGNOSIS — K582 Mixed irritable bowel syndrome: Secondary | ICD-10-CM

## 2016-01-20 DIAGNOSIS — R1012 Left upper quadrant pain: Secondary | ICD-10-CM

## 2016-01-20 DIAGNOSIS — R140 Abdominal distension (gaseous): Secondary | ICD-10-CM | POA: Diagnosis not present

## 2016-01-20 DIAGNOSIS — R1011 Right upper quadrant pain: Secondary | ICD-10-CM

## 2016-01-20 NOTE — Progress Notes (Signed)
HISTORY OF PRESENT ILLNESS:  Michelle Moody is a 31 y.o. female with hypertension, hyperlipidemia, obesity, chronic migraine headaches, and chronic abdominal complaints who presents today after referral from her primary care provider Dr. Sheppard Coil with a chief complaint of left upper quadrant pain. The patient is known to have cholelithiasis and a history of intermittent right upper quadrant pain. I initially saw the patient about 2 years ago or problems with diarrhea. 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 In addition GERD exacerbated by weight gain. Recommendations were fiber, reflux precautions with weight loss, and on demand PPI as needed with GI follow-up as needed. She was evaluated by her primary provider in September for rectal quadrant pain. She described a fullness sensation in the preceding weeks. She was found to have gallstones without other abnormalities. Examination was limited to the right upper quadrant. CT scan in 2014 to evaluate epigastric pain with nausea and vomiting revealed cholelithiasis and right ovarian cyst. Blood work in September was unremarkable including CBC and comprehensive metabolic panel as well as TSH. Negative urine pregnancy test. She did undergo colonoscopy November 2016. The terminal ileum was normal. Mild right-sided diverticulosis present. Random colon biopsies were normal. The patient reports a several month history of intermittent problems with left upper quadrant discomfort. She describes waking and feeling well with a flat and comfortable abdomen. As the day goes on, particular after meals, she develops bloating which is associated with left upper quadrant discomfort which radiates into her left back. Symptoms occur after completing her meal and typically last one hour. There is no nocturnal component. She has worked hard on losing weight. Approximate 30 pounds. This has resulted in  resolution of the majority of her previous reflux symptoms. She describes alternating bowel habits and intermittent nausea. She does use occasional NSAIDs. Defecation can help symptoms  REVIEW OF SYSTEMS:  All non-GI ROS negative except for back pain, cough, headaches, heart murmur, itching, swelling left lower extremity  Past Medical History:  Diagnosis Date  . Anemia 07/06/2013  . Anxiety    2011  . Diverticulosis   . Elevated blood pressure   . Gallstones    02/2012  . History of dental surgery    2007  . Hyperlipidemia   . IBS (irritable bowel syndrome)   . Migraines   . Obese   . Post-operative nausea and vomiting   . Rectal bleeding     Past Surgical History:  Procedure Laterality Date  . CYST EXCISION     14 removed from scalp  . DENTAL SURGERY      Social History GRACIE PANO  reports that she has never smoked. She has never used smokeless tobacco. She reports that she does not drink alcohol or use drugs.  family history includes Alcohol abuse in her maternal grandfather; Alzheimer's disease in her father and paternal grandmother; Cancer in her maternal grandfather and maternal grandmother; Cancer (age of onset: 99) in her paternal aunt; Cancer (age of onset: 60) in her father; Colon polyps in her mother; Crohn's disease in her paternal uncle; Dementia in her father and paternal grandmother; Heart disease in her paternal grandfather; Hyperlipidemia in her brother, father, and paternal grandfather; Hypertension in her father and paternal grandfather; Stroke in her paternal grandmother; Thyroid disease in her mother.  Allergies  Allergen Reactions  . Soy Allergy     Causes diarrhea  . Clindamycin/Lincomycin Nausea And Vomiting  .  Latex Rash       PHYSICAL EXAMINATION: Vital signs: BP 128/74   Pulse 88   Ht 5\' 3"  (1.6 m)   Wt 211 lb 2 oz (95.8 kg)   LMP 01/11/2016   BMI 37.40 kg/m   Constitutional: Pleasant, generally well-appearing, no acute  distress Psychiatric: alert and oriented x3, cooperative Eyes: extraocular movements intact, anicteric, conjunctiva pink Mouth: oral pharynx moist, no lesions Neck: supple no lymphadenopathy Cardiovascular: heart regular rate and rhythm, no murmur Lungs: clear to auscultation bilaterally Abdomen: soft, obese, nontender, nondistended, no obvious ascites, no peritoneal signs, normal bowel sounds, no organomegaly Rectal: Omitted Extremities: no clubbing cyanosis or upper/ lower extremity edema bilaterally Skin: no lesions on visible extremities Neuro: No focal deficits. Cranial nerves intact  ASSESSMENT:  #1. Left upper quadrant pain as described. 3 months duration. May be related to IBS. Rule out ulcer given history of NSAID use and postprandial component. #2. IBS. Manifested by postprandial bloating and alternating bowel habits #3. GERD. Improved with weight loss #4. Obesity. Ongoing but improving #5. Cholelithiasis. At this point, felt to be incidental. Continue to observe. For signs or symptoms suggesting symptomatic gallbladder disease, surgical referral #6. Mild diverticulosis on colonoscopy   PLAN:  #1. Prescribed Levsin sublingual for possible IBS related spasm pain #2. Schedule upper endoscopy to further evaluate pain and rule out ulcer.The nature of the procedure, as well as the risks, benefits, and alternatives were carefully and thoroughly reviewed with the patient. Ample time for discussion and questions allowed. The patient understood, was satisfied, and agreed to proceed. #3. Ongoing weight loss and reflux precautions #4. If the above workup negative and antispasmodic not helpful, then recommend contrast enhanced CT scan of the abdomen to evaluate the left upper quadrant pain which is new and persistent.  A copy of this consultation note has been sent to Dr. Sheppard Coil

## 2016-01-20 NOTE — Patient Instructions (Signed)

## 2016-01-22 ENCOUNTER — Ambulatory Visit (AMBULATORY_SURGERY_CENTER): Payer: 59 | Admitting: Internal Medicine

## 2016-01-22 ENCOUNTER — Encounter: Payer: Self-pay | Admitting: Internal Medicine

## 2016-01-22 VITALS — BP 104/55 | HR 67 | Temp 97.5°F | Resp 11 | Ht 63.0 in | Wt 211.0 lb

## 2016-01-22 DIAGNOSIS — R109 Unspecified abdominal pain: Secondary | ICD-10-CM | POA: Diagnosis not present

## 2016-01-22 DIAGNOSIS — R1012 Left upper quadrant pain: Secondary | ICD-10-CM

## 2016-01-22 MED ORDER — SODIUM CHLORIDE 0.9 % IV SOLN: 500.0000 mL | INTRAVENOUS | Status: DC

## 2016-01-22 MED ORDER — HYOSCYAMINE SULFATE 0.125 MG SL SUBL: SUBLINGUAL_TABLET | SUBLINGUAL | 2 refills | Status: DC

## 2016-01-22 MED FILL — OSCIMIN SL 0.125 MG TABLET: 0.125 | 4 days supply | Qty: 30 | Fill #0

## 2016-01-22 NOTE — Progress Notes (Signed)
A/ox3 pleased with MAC, report to [facility]

## 2016-01-22 NOTE — Patient Instructions (Signed)
YOU HAD AN ENDOSCOPIC PROCEDURE TODAY AT THE [facility]:   Refer to the procedure report that was given to you for any specific questions about what was found during the examination.  If the procedure report does not answer your questions, please call your gastroenterologist to clarify.  If you requested that your care partner not be given the details of your procedure findings, then the procedure report has been included in a sealed envelope for you to review at your convenience later.  YOU SHOULD EXPECT: Some feelings of bloating in the abdomen. Passage of more gas than usual.  Walking can help get rid of the air that was put into your GI tract during the procedure and reduce the bloating. If you had a lower endoscopy (such as a colonoscopy or flexible sigmoidoscopy) you may notice spotting of blood in your stool or on the toilet paper. If you underwent a bowel prep for your procedure, you may not have a normal bowel movement for a few days.  Please Note:  You might notice some irritation and congestion in your nose or some drainage.  This is from the oxygen used during your procedure.  There is no need for concern and it should clear up in a day or so.  SYMPTOMS TO REPORT IMMEDIATELY:     Following upper endoscopy (EGD)  Vomiting of blood or coffee ground material  New chest pain or pain under the shoulder blades  Painful or persistently difficult swallowing  New shortness of breath  Fever of 100F or higher  Black, tarry-looking stools  For urgent or emergent issues, a gastroenterologist can be reached at any hour by calling [phone].   DIET:  We do recommend a small meal at first, but then you may proceed to your regular diet.  Drink plenty of fluids but you should avoid alcoholic beverages for 24 hours.  ACTIVITY:  You should plan to take it easy for the rest of today and you should NOT DRIVE or use heavy machinery until tomorrow (because of the sedation medicines  used during the test).    FOLLOW UP: Our staff will call the number listed on your records the next business day following your procedure to check on you and address any questions or concerns that you may have regarding the information given to you following your procedure. If we do not reach you, we will leave a message.  However, if you are feeling well and you are not experiencing any problems, there is no need to return our call.  We will assume that you have returned to your regular daily activities without incident.  If any biopsies were taken you will be contacted by phone or by letter within the next 1-3 weeks.  Please call us at [phone] if you have not heard about the biopsies in 3 weeks.    SIGNATURES/CONFIDENTIALITY: You and/or your care partner have signed paperwork which will be entered into your electronic medical record.  These signatures attest to the fact that that the information above on your After Visit Summary has been reviewed and is understood.  Full responsibility of the confidentiality of this discharge information lies with you and/or your care-partner.   Resume medications. 

## 2016-01-22 NOTE — Progress Notes (Signed)
Dental advisory given to patient 

## 2016-01-22 NOTE — Op Note (Signed)
[facility] Patient Name: Michelle Moody Procedure Date: 01/22/2016 10:18 AM MRN: [id number] Endoscopist: Docia Chuck. Henrene Pastor , MD Age: 31 Referring MD:  Date of Birth: 11/29/1985 Gender: Female Account #: [id number] Procedure:                Upper GI endoscopy Indications:              Abdominal pain in the left upper quadrant Medicines:                Monitored Anesthesia Care Procedure:                Pre-Anesthesia Assessment:                           - Prior to the procedure, a History and Physical                            was performed, and patient medications and                            allergies were reviewed. The patient's tolerance of                            previous anesthesia was also reviewed. The risks                            and benefits of the procedure and the sedation                            options and risks were discussed with the patient.                            All questions were answered, and informed consent                            was obtained. Prior Anticoagulants: The patient has                            taken no previous anticoagulant or antiplatelet                            agents. ASA Grade Assessment: II - A patient with                            mild systemic disease. After reviewing the risks                            and benefits, the patient was deemed in                            satisfactory condition to undergo the procedure.                           After obtaining informed consent, the endoscope was  passed under direct vision. Throughout the                            procedure, the patient's blood pressure, pulse, and                            oxygen saturations were monitored continuously. The                            Model GIF-[id number]) scope was introduced                            through the mouth, and advanced to the second part                            of  duodenum. The upper GI endoscopy was                            accomplished without difficulty. The patient                            tolerated the procedure well. Scope In: Scope Out: Findings:                 The esophagus was normal.                           The stomach was normal.                           The examined duodenum was normal.                           The cardia and gastric fundus were normal on                            retroflexion. Complications:            No immediate complications. Estimated Blood Loss:     Estimated blood loss: none. Impression:               - Normal esophagus.                           - Normal stomach.                           - Normal examined duodenum.                           - No specimens collected. Recommendation:           1. Schedule contrast-enhanced CT scan of the                            abdomen "persistent left upper quadrant pain,                            negative  ultrasound and endoscopy".                           2. Prescribed Levsin sublingual (generic                            equivalent) 0.125 mg; #30; 1-2 sublingual every 4-6                            hours as needed for upper abdominal pain; 2 refills John N. Henrene Pastor, MD 01/22/2016 10:32:03 AM This report has been signed electronically.

## 2016-01-25 ENCOUNTER — Telehealth: Payer: Self-pay | Admitting: *Deleted

## 2016-01-25 NOTE — Telephone Encounter (Signed)
  Follow up Call-  Call back number 01/22/2016 12/04/2014  Post procedure Call Back phone  # [phone]  Permission to leave phone message Yes Yes  Some recent data might be hidden     Patient questions:  Do you have a fever, pain , or abdominal swelling? No. Pain Score  0 *  Have you tolerated food without any problems? Yes.    Have you been able to return to your normal activities? Yes.    Do you have any questions about your discharge instructions: Diet   No. Medications  No. Follow up visit  No.  Do you have questions or concerns about your Care? No.  Actions: * If pain score is 4 or above: No action needed, pain <4.

## 2016-01-26 ENCOUNTER — Ambulatory Visit: Payer: 59 | Admitting: Neurology

## 2016-01-27 ENCOUNTER — Other Ambulatory Visit: Payer: Self-pay

## 2016-01-27 ENCOUNTER — Other Ambulatory Visit: Payer: Self-pay | Admitting: Osteopathic Medicine

## 2016-01-27 ENCOUNTER — Telehealth: Payer: Self-pay

## 2016-01-27 DIAGNOSIS — R1012 Left upper quadrant pain: Secondary | ICD-10-CM

## 2016-01-27 DIAGNOSIS — N926 Irregular menstruation, unspecified: Secondary | ICD-10-CM

## 2016-01-27 NOTE — Telephone Encounter (Signed)
Called and scheduled ct of abd at [facility] ct. Spoke with pt and she wants it at [facility]. Appt cancelled and new order in epic. Pt to call and schedule in [place].

## 2016-01-28 ENCOUNTER — Encounter: Payer: Self-pay | Admitting: Osteopathic Medicine

## 2016-01-28 ENCOUNTER — Other Ambulatory Visit: Payer: 59

## 2016-02-03 ENCOUNTER — Other Ambulatory Visit: Payer: 59

## 2016-02-04 ENCOUNTER — Other Ambulatory Visit: Payer: 59

## 2016-02-15 ENCOUNTER — Encounter: Payer: Self-pay | Admitting: Osteopathic Medicine

## 2016-02-15 ENCOUNTER — Ambulatory Visit (INDEPENDENT_AMBULATORY_CARE_PROVIDER_SITE_OTHER): Payer: 59 | Admitting: Osteopathic Medicine

## 2016-02-15 ENCOUNTER — Ambulatory Visit (INDEPENDENT_AMBULATORY_CARE_PROVIDER_SITE_OTHER): Payer: 59

## 2016-02-15 VITALS — BP 134/71 | HR 71 | Ht 63.0 in | Wt 206.0 lb

## 2016-02-15 DIAGNOSIS — R002 Palpitations: Secondary | ICD-10-CM | POA: Insufficient documentation

## 2016-02-15 DIAGNOSIS — K8020 Calculus of gallbladder without cholecystitis without obstruction: Secondary | ICD-10-CM | POA: Diagnosis not present

## 2016-02-15 DIAGNOSIS — K8080 Other cholelithiasis without obstruction: Secondary | ICD-10-CM

## 2016-02-15 DIAGNOSIS — R161 Splenomegaly, not elsewhere classified: Secondary | ICD-10-CM | POA: Diagnosis not present

## 2016-02-15 DIAGNOSIS — R1012 Left upper quadrant pain: Secondary | ICD-10-CM

## 2016-02-15 DIAGNOSIS — I4510 Unspecified right bundle-branch block: Secondary | ICD-10-CM

## 2016-02-15 MED ORDER — IOPAMIDOL (ISOVUE-300) INJECTION 61%
100.0000 mL | Freq: Once | INTRAVENOUS | Status: AC | PRN
  Administered 2016-02-15: 100 mL via INTRAVENOUS

## 2016-02-15 NOTE — Progress Notes (Signed)
HPI: Michelle Moody is a 31 y.o. female  who presents to [facility] today, 02/15/16,  for chief complaint of:  Chief Complaint  Patient presents with  . Other    Discuss heart murmer    Palpitations: Patient has sensation of feeling heart beat, does not notice that hurt is beating fast necessarily, just feels like something. Noticing lastly several days. Has been better over the weekend, feels like it might be happening a bit now. Has some concern about pain cardiac murmur which has been heard on several occasions. Has some concerns as she and husband are thinking of trying for another baby.   Past medical history, surgical history, social history and family history reviewed.  Patient Active Problem List   Diagnosis Date Noted  . 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 Intractable migraine without aura and without status migrainosus 10/14/2014  . Chronic tension-type headache, not intractable 10/14/2014  . Neck pain 10/14/2014  . Weight gain 08/21/2014  . Left first tarsometatarsal synovitis 03/27/2014  . Dermoid cyst of scalp 03/27/2014  . Anemia 07/06/2013  . 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 Gallstones, current 07/04/2013  . Migraine 07/04/2013  . 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 Obese 07/04/2013  . Hyperlipidemia 07/04/2013    Current medication list and allergy/intolerance information reviewed.   Current Outpatient Prescriptions on File Prior to Visit  Medication Sig Dispense Refill  . albuterol (PROVENTIL HFA;VENTOLIN HFA) 108 (90 Base) MCG/ACT inhaler Inhale 2 puffs into the lungs every 6 (six) hours as needed for wheezing or shortness of breath. 1 Inhaler 1  . cholecalciferol (VITAMIN D) 1000 UNITS tablet Take 1,000 Units by mouth daily.    . Coenzyme Q10 (CO Q 10 PO) Take 200 mg by mouth.     . Ferrous Sulfate (IRON) 325 (65 FE) MG TABS Take 1 tablet by mouth daily.    . Feverfew 380 MG CAPS Take by mouth daily at 2 PM.    .  Fish Oil-Cholecalciferol (FISH OIL + D3 PO) Take 1,000 mg by mouth.     . frovatriptan (FROVA) 2.5 MG tablet Take 1 tab BID starting 2 days prior to onset of expected migraine and for total of 6 days. 36 tablet 0  . hyoscyamine (LEVSIN SL) 0.125 MG SL tablet Take sublingual 1-2 every 4-6 hours as needed for upper abdominal pain 30 tablet 2  . Magnesium 400 MG TABS Take 1 tablet by mouth daily.    . naproxen sodium (ANAPROX) 550 MG tablet Take 1 tablet for migraine.  May repeat in 12 hours if needed. 15 tablet 0  . Omega-3 Fatty Acids (FISH OIL CONCENTRATE PO) Take by mouth.    Marland Kitchen OVER THE COUNTER MEDICATION BUTTERBUR. Take 1 capsule every morning for migraines.    . Prenatal Vit-Fe Fumarate-FA (PRENATAL MULTIVITAMIN) TABS tablet Take 1 tablet by mouth daily at 12 noon.    . promethazine (PHENERGAN) 25 MG tablet Take 1 tablet (25 mg total) by mouth every 6 (six) hours as needed for nausea. 15 tablet 1  . tiZANidine (ZANAFLEX) 2 MG tablet Take 1 tablet (2 mg total) by mouth every 6 (six) hours as needed for muscle spasms. 30 tablet 0  . vitamin E 100 UNIT capsule Take by mouth daily.     Current Facility-Administered Medications on File Prior to Visit  Medication Dose Route Frequency Provider Last Rate Last Dose  . 0.9 %  sodium chloride infusion  500 mL Intravenous Continuous Docia Chuck  Henrene Pastor, MD       Allergies  Allergen Reactions  . Soy Allergy     Causes diarrhea  . Clindamycin/Lincomycin Nausea And Vomiting  . Latex Rash      Review of Systems:  Constitutional: No recent illness  HEENT: No  headache, no vision change  Cardiac: No  chest pain, +pressure, +palpitations. No dizziness or loss of consciousness. Reports some tingling-type feeling in the left arm but this has resolved. No chest pain on exertion, Asian is able to exercise normally  Respiratory:  No  shortness of breath. No  Cough  Gastrointestinal: No  abdominal pain  Musculoskeletal: No new myalgia/arthralgia  Skin: No   Rash  Hem/Onc: No  easy bruising/bleeding, No  abnormal lumps/bumps  Neurologic: No  weakness, No  Dizziness  Psychiatric: No  concerns with depression, No  concerns with anxiety  Exam:  BP 134/71   Pulse 71   Ht 5\' 3"  (1.6 m)   Wt 206 lb (93.4 kg)   LMP 02/08/2016   BMI 36.49 kg/m   Constitutional: VS see above. General Appearance: alert, well-developed, well-nourished, NAD  Eyes: Normal lids and conjunctive, non-icteric sclera  Ears, Nose, Mouth, Throat: MMM, Normal external inspection ears/nares/mouth/lips/gums.  Neck: No masses, trachea midline.   Respiratory: Normal respiratory effort. no wheeze, no rhonchi, no rales  Cardiovascular: S1/S2 normal, very faint systolic murmur, no rub/gallop auscultated. RRR.   Musculoskeletal: Gait normal. Symmetric and independent movement of all extremities  Neurological: Normal balance/coordination. No tremor.  Skin: warm, dry, intact.   Psychiatric: Normal judgment/insight. Normal mood and affect. Oriented x3.       EKG interpretation:  Rate: 65  Rhythm: sinus  Incomplete RBBB - M waves and inverted T in V1 V2  Inv T in V2 is new  Some mild M-wave type morphology in old EKG but indistinct   No ST/T changes concerning for acute ischemia/infarct    ASSESSMENT/PLAN: Abnormal EKG. Patient is quite anxious about her heart flutter-type feelings. We'll go ahead and refer to cardiology and initiate limited workup with Holter monitor and echocardiogram. Normal exercise stress test less than 1 year ago. No dizziness/shortness of breath, no chest pain on exertion. ER precautions were reviewed.  Heart palpitations - Plan: EKG 12-Lead, Holter monitor - 48 hour, ECHOCARDIOGRAM COMPLETE, Ambulatory referral to Cardiology  Right bundle branch block (RBBB) on electrocardiogram (ECG) - Plan: Ambulatory referral to Cardiology      Follow-up plan: Return if symptoms worsen or fail to improve.  Visit summary with medication list  and pertinent instructions was printed for patient to review, alert [place] if any changes needed. All questions at time of visit were answered - patient instructed to contact office with any additional concerns. ER/RTC precautions were reviewed with the patient and understanding verbalized.   Note: Total time spent 25 minutes, greater than 50% of the visit was spent face-to-face counseling and coordinating care for the following: The primary encounter diagnosis was Heart palpitations. A diagnosis of Right bundle branch block (RBBB) on electrocardiogram (ECG) was also pertinent to this visit.Marland Kitchen

## 2016-02-16 NOTE — Progress Notes (Signed)
Cardiology Office Note    Date:  02/17/2016   ID:  Michelle Moody Michelle Moody, MRN [id number]  PCP:  Emeterio Reeve, DO  Cardiologist:  Willard Farquharson Martinique, MD    History of Present Illness:  Michelle Moody is a 31 y.o. female seen at the request of Dr. Sheppard Coil for evaluation of heart palpitations and abnormal Ecg. She did have an ETT in April 2017 that was normal. She reports that she has been noted to have a heart murmur intermittently. She reports intermittent palpitations. She feels like her heart is beating out of her chest. Feels it all over her body. Took pulse and it wasn't racing. Notes fleeting chest pain but no pressure or SOB. No syncope except after her second pregnancy when she lost a lot of blood and required a transfusion. Otherwise last 2 pregnancies were unremarkable.   Past Medical History:  Diagnosis Date  . Anemia 07/06/2013  . Anxiety    2011  . Diverticulosis   . Elevated blood pressure   . Gallstones    02/2012  . History of dental surgery    2007  . Hyperlipidemia   . IBS (irritable bowel syndrome)   . Migraines   . Obese   . Post-operative nausea and vomiting   . Rectal bleeding     Past Surgical History:  Procedure Laterality Date  . CYST EXCISION     14 removed from scalp  . DENTAL SURGERY      Current Medications: Outpatient Medications Prior to Visit  Medication Sig Dispense Refill  . albuterol (PROVENTIL HFA;VENTOLIN HFA) 108 (90 Base) MCG/ACT inhaler Inhale 2 puffs into the lungs every 6 (six) hours as needed for wheezing or shortness of breath. 1 Inhaler 1  . cholecalciferol (VITAMIN D) 1000 UNITS tablet Take 1,000 Units by mouth daily.    . Coenzyme Q10 (CO Q 10 PO) Take 200 mg by mouth.     . Ferrous Sulfate (IRON) 325 (65 FE) MG TABS Take 1 tablet by mouth daily.    . Feverfew 380 MG CAPS Take by mouth daily at 2 PM.    . Fish Oil-Cholecalciferol (FISH OIL + D3 PO) Take 1,000 mg by mouth.     . frovatriptan (FROVA) 2.5 MG  tablet Take 1 tab BID starting 2 days prior to onset of expected migraine and for total of 6 days. 36 tablet 0  . hyoscyamine (LEVSIN SL) 0.125 MG SL tablet Take sublingual 1-2 every 4-6 hours as needed for upper abdominal pain 30 tablet 2  . Magnesium 400 MG TABS Take 1 tablet by mouth daily.    . naproxen sodium (ANAPROX) 550 MG tablet Take 1 tablet for migraine.  May repeat in 12 hours if needed. 15 tablet 0  . Omega-3 Fatty Acids (FISH OIL CONCENTRATE PO) Take by mouth.    Marland Kitchen OVER THE COUNTER MEDICATION BUTTERBUR. Take 1 capsule every morning for migraines.    . Prenatal Vit-Fe Fumarate-FA (PRENATAL MULTIVITAMIN) TABS tablet Take 1 tablet by mouth daily at 12 noon.    . promethazine (PHENERGAN) 25 MG tablet Take 1 tablet (25 mg total) by mouth every 6 (six) hours as needed for nausea. 15 tablet 1  . tiZANidine (ZANAFLEX) 2 MG tablet Take 1 tablet (2 mg total) by mouth every 6 (six) hours as needed for muscle spasms. 30 tablet 0  . vitamin E 100 UNIT capsule Take by mouth daily.     Facility-Administered Medications Prior to Visit  Medication Dose  Route Frequency Provider Last Rate Last Dose  . 0.9 %  sodium chloride infusion  500 mL Intravenous Continuous Irene Shipper, MD         Allergies:   Soy allergy; Clindamycin/lincomycin; and Latex   Social History   Social History  . Marital status: Married    Spouse name: N/A  . Number of children: 2  . Years of education: N/A   Occupational History  . administrative    Social History Main Topics  . Smoking status: Never Smoker  . Smokeless tobacco: Never Used  . Alcohol use No  . Drug use: No  . Sexual activity: Yes    Birth control/ protection: Condom     Comment: lives with husband and kids, works for [employer]   Other Topics Concern  . None   Social History Narrative  . None     Family History:  The patient's family history includes Alcohol abuse in her maternal grandfather; Alzheimer's disease in her father and paternal  grandmother; Cancer in her maternal grandfather and maternal grandmother; Cancer (age of onset: 66) in her paternal aunt; Cancer (age of onset: 16) in her father; Colon polyps in her mother; Crohn's disease in her paternal uncle; Dementia in her father and paternal grandmother; Heart disease in her paternal grandfather; Hyperlipidemia in her brother, father, and paternal grandfather; Hypertension in her father and paternal grandfather; Stroke in her paternal grandmother; Thyroid disease in her mother.   ROS:   Please see the history of present illness.    ROS All other systems reviewed and are negative.   PHYSICAL EXAM:   VS:  BP 110/72   Pulse 68   Ht 5\' 3"  (1.6 m)   Wt 207 lb (93.9 kg)   LMP 02/08/2016   BMI 36.67 kg/m    GEN: Well nourished, well developed, in no acute distress  HEENT: normal  Neck: no JVD, carotid bruits, or masses Cardiac: RRR; no murmurs, rubs, or gallops,no edema  Respiratory:  clear to auscultation bilaterally, normal work of breathing GI: soft, nontender, nondistended, + BS MS: no deformity or atrophy  Skin: warm and dry, no rash Neuro:  Alert and Oriented x 3, Strength and sensation are intact Psych: euthymic mood, full affect  Wt Readings from Last 3 Encounters:  02/17/16 207 lb (93.9 kg)  02/15/16 206 lb (93.4 kg)  01/22/16 211 lb (95.7 kg)      Studies/Labs Reviewed:   EKG:  EKG is not ordered today.  The ekg ordered today demonstrates N/A  Recent Labs: 09/29/2015: ALT 13; BUN 12; Creat 0.74; Hemoglobin 12.3; Platelets 322; Potassium 3.9; Sodium 139; TSH 3.96   Lipid Panel    Component Value Date/Time   CHOL 167 06/03/2015 0937   TRIG 127 06/03/2015 0937   HDL 41 (L) 06/03/2015 0937   CHOLHDL 4.1 06/03/2015 0937   VLDL 25 06/03/2015 0937   LDLCALC 101 06/03/2015 0937    Additional studies/ records that were reviewed today include:  ETT 05/06/15: Study Highlights    Blood pressure demonstrated a hypertensive response to  exercise.  There was no ST segment deviation noted during stress.  Normal exericise tolerance test with no ischemia.  Normal exercise capacity. Patient exercised to 11.7 mets.  Patient had no symptoms during the study     ASSESSMENT:    1. Heart palpitations   2. Abnormal ECG      PLAN:  In order of problems listed above:  1. Heart pounding. No documented arrhythmia. ?  If it is more anxiety related. Agree with Holter monitor and will review results. 2. Abnormal Ecg. Unfortunately tracing is not in Epic. Will request a copy. ? If this is a RBBB or incomplete RBBB. Incomplete RBBB is a normal variant.  3. ? Murmur. None heard on exam today. Will follow up on Echo.    Medication Adjustments/Labs and Tests Ordered: Current medicines are reviewed at length with the patient today.  Concerns regarding medicines are outlined above.  Medication changes, Labs and Tests ordered today are listed in the Patient Instructions below. There are no Patient Instructions on file for this visit.   Signed, Aesha Agrawal Martinique, MD  02/17/2016 3:45 PM    [facility] [address] [place], [place], [address] [phone]

## 2016-02-17 ENCOUNTER — Encounter: Payer: Self-pay | Admitting: Cardiology

## 2016-02-17 ENCOUNTER — Other Ambulatory Visit: Payer: Self-pay

## 2016-02-17 ENCOUNTER — Other Ambulatory Visit: Payer: Self-pay | Admitting: Osteopathic Medicine

## 2016-02-17 ENCOUNTER — Ambulatory Visit (INDEPENDENT_AMBULATORY_CARE_PROVIDER_SITE_OTHER): Payer: 59 | Admitting: Cardiology

## 2016-02-17 ENCOUNTER — Ambulatory Visit (INDEPENDENT_AMBULATORY_CARE_PROVIDER_SITE_OTHER): Payer: 59

## 2016-02-17 VITALS — BP 110/72 | HR 68 | Ht 63.0 in | Wt 207.0 lb

## 2016-02-17 DIAGNOSIS — R002 Palpitations: Secondary | ICD-10-CM

## 2016-02-17 DIAGNOSIS — I4510 Unspecified right bundle-branch block: Secondary | ICD-10-CM | POA: Diagnosis not present

## 2016-02-17 DIAGNOSIS — R9431 Abnormal electrocardiogram [ECG] [EKG]: Secondary | ICD-10-CM | POA: Diagnosis not present

## 2016-02-24 ENCOUNTER — Other Ambulatory Visit (HOSPITAL_COMMUNITY): Payer: 59

## 2016-02-24 ENCOUNTER — Ambulatory Visit (HOSPITAL_BASED_OUTPATIENT_CLINIC_OR_DEPARTMENT_OTHER)
Admission: RE | Admit: 2016-02-24 | Discharge: 2016-02-24 | Disposition: A | Discharge status: Home / Self Care | Payer: 59 | Source: Ambulatory Visit | Attending: Osteopathic Medicine | Admitting: Osteopathic Medicine

## 2016-02-24 ENCOUNTER — Telehealth: Payer: Self-pay | Admitting: Cardiology

## 2016-02-24 DIAGNOSIS — R002 Palpitations: Secondary | ICD-10-CM | POA: Diagnosis not present

## 2016-02-24 NOTE — Telephone Encounter (Signed)
Yes I reviewed Ecg and it shows a borderline incomplete RBBB. This is a normal variant and of no concern  Michelle Huckins Martinique MD, [facility]

## 2016-02-24 NOTE — Progress Notes (Signed)
  Echocardiogram 2D Echocardiogram has been performed.  Tresa Res 02/24/2016, 2:02 PM

## 2016-02-24 NOTE — Telephone Encounter (Signed)
Spoke to patient Informed her will inform Dr Martinique, that the EKG from 02/15/16 is in epic under( 01/20/16 EKG 12-LEAD)  Aware will contact her with further details once Dr Martinique reviews

## 2016-02-24 NOTE — Telephone Encounter (Signed)
New message  Pt verbalized that the EKG is not in EPIC for Dr.Jordan to view

## 2016-02-24 NOTE — Telephone Encounter (Signed)
Patient aware. Voiced underestanding. Patient state she had echo done at [place] today, and 48  Hour monitor completed last week wanted to know when will she hear about results  RN INFORMED HER ONCE Dr Martinique review will be contacted - possible by the middle of next week or sooner.

## 2016-02-25 NOTE — Telephone Encounter (Signed)
Spoke to patient Dr.Jordan advised EKG and monitor normal.

## 2016-02-25 NOTE — Telephone Encounter (Signed)
Also given results of normal echo.

## 2016-03-02 MED FILL — PROMETHAZINE 25 MG TABLET: 25 | 4 days supply | Qty: 15 | Fill #1

## 2016-03-25 DIAGNOSIS — Z3040 Encounter for surveillance of contraceptives, unspecified: Secondary | ICD-10-CM | POA: Diagnosis not present

## 2016-03-25 DIAGNOSIS — O210 Mild hyperemesis gravidarum: Secondary | ICD-10-CM | POA: Diagnosis not present

## 2016-03-25 DIAGNOSIS — N390 Urinary tract infection, site not specified: Secondary | ICD-10-CM | POA: Diagnosis not present

## 2016-03-25 MED FILL — METOCLOPRAMIDE 10 MG TABLET: 10 | 7 days supply | Qty: 30 | Fill #0

## 2016-03-25 MED FILL — CEPHALEXIN 500 MG CAPSULE: 500 | 7 days supply | Qty: 14 | Fill #0

## 2016-04-01 DIAGNOSIS — Z3A01 Less than 8 weeks gestation of pregnancy: Secondary | ICD-10-CM | POA: Diagnosis not present

## 2016-04-01 DIAGNOSIS — Z3491 Encounter for supervision of normal pregnancy, unspecified, first trimester: Secondary | ICD-10-CM | POA: Diagnosis not present

## 2016-04-04 MED FILL — METOCLOPRAMIDE 10 MG TABLET: 10 | 15 days supply | Qty: 60 | Fill #0

## 2016-04-25 DIAGNOSIS — Z3491 Encounter for supervision of normal pregnancy, unspecified, first trimester: Secondary | ICD-10-CM | POA: Diagnosis not present

## 2016-04-25 DIAGNOSIS — O219 Vomiting of pregnancy, unspecified: Secondary | ICD-10-CM | POA: Diagnosis not present

## 2016-04-25 DIAGNOSIS — Z3A11 11 weeks gestation of pregnancy: Secondary | ICD-10-CM | POA: Diagnosis not present

## 2016-04-25 DIAGNOSIS — R112 Nausea with vomiting, unspecified: Secondary | ICD-10-CM | POA: Diagnosis not present

## 2016-05-09 MED FILL — METOCLOPRAMIDE 10 MG TABLET: 10 | 8 days supply | Qty: 30 | Fill #0

## 2016-05-17 DIAGNOSIS — Z3491 Encounter for supervision of normal pregnancy, unspecified, first trimester: Secondary | ICD-10-CM | POA: Diagnosis not present

## 2016-05-17 DIAGNOSIS — Z3A13 13 weeks gestation of pregnancy: Secondary | ICD-10-CM | POA: Diagnosis not present

## 2016-06-08 DIAGNOSIS — Z3482 Encounter for supervision of other normal pregnancy, second trimester: Secondary | ICD-10-CM | POA: Diagnosis not present

## 2016-06-08 DIAGNOSIS — O26842 Uterine size-date discrepancy, second trimester: Secondary | ICD-10-CM | POA: Diagnosis not present

## 2016-06-28 DIAGNOSIS — Z3482 Encounter for supervision of other normal pregnancy, second trimester: Secondary | ICD-10-CM | POA: Diagnosis not present

## 2016-11-30 IMAGING — CT CT MAXILLOFACIAL W/O CM
1 series · 9 of 11 positions shown, 12 images · non-contrast
Comparison: None.

CLINICAL DATA: Chronic sinus infections, multiple rounds of
antibiotics without relief.

EXAM:
CT PARANASAL SINUS LIMITED WITHOUT CONTRAST
TECHNIQUE: Non-contiguous multidetector CT images of the paranasal sinuses were
obtained in a single plane without contrast.

[Series 3: limited sinus st · axial · 0.24mm/px · z∈[-139,-59]mm · 9 of 11 slices shown, 12 images]
[im 2/11  brain]
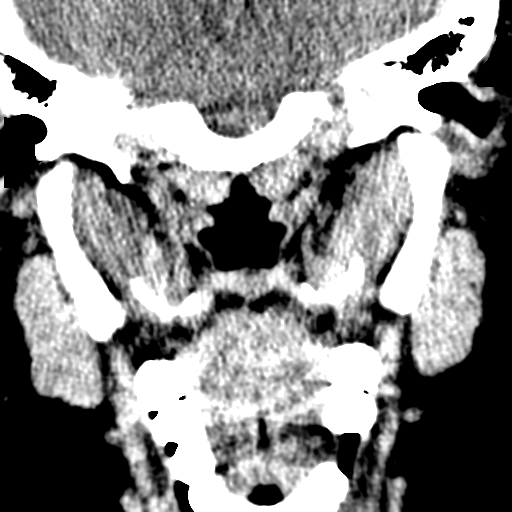
[im 2/11  bone]
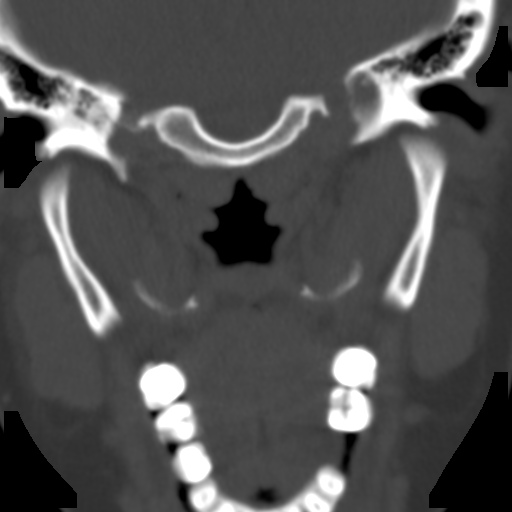
[im 3/11  bone]
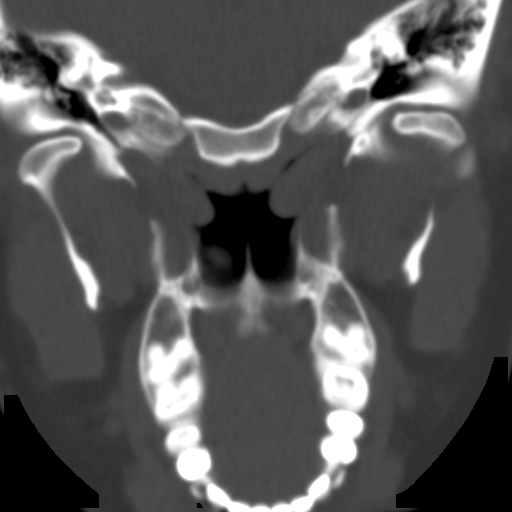
[im 4/11  bone]
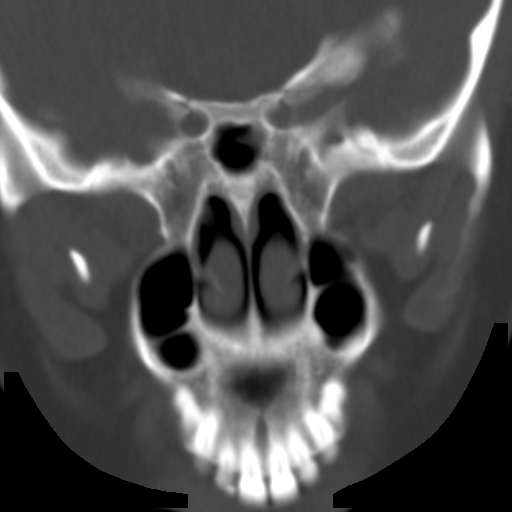
[im 5/11  bone]
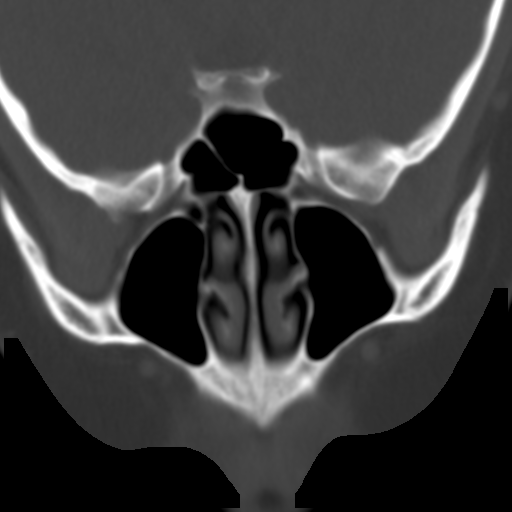
[im 6/11  brain]
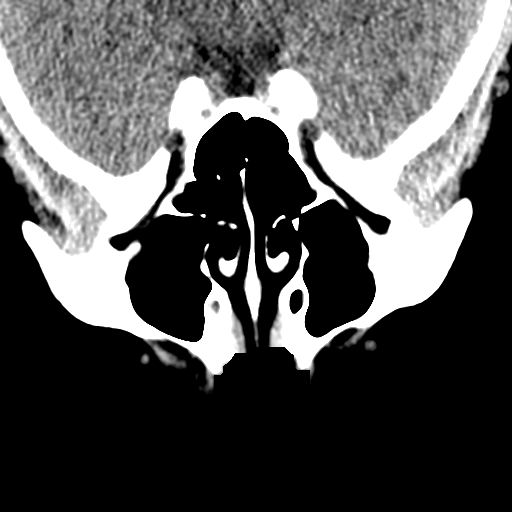
[im 6/11  bone]
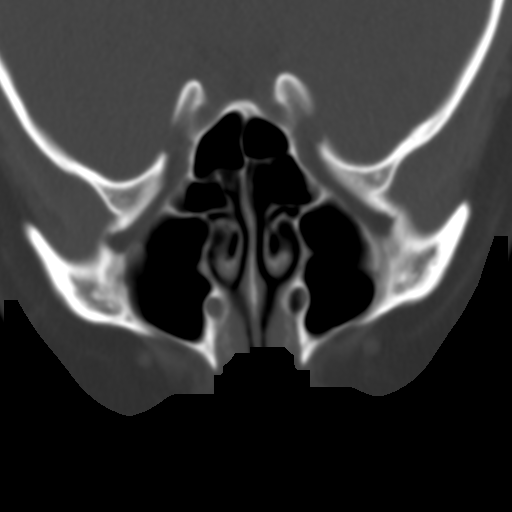
[im 7/11  bone]
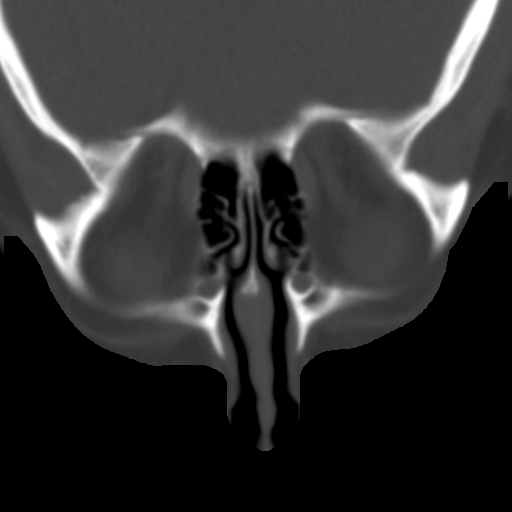
[im 8/11  bone]
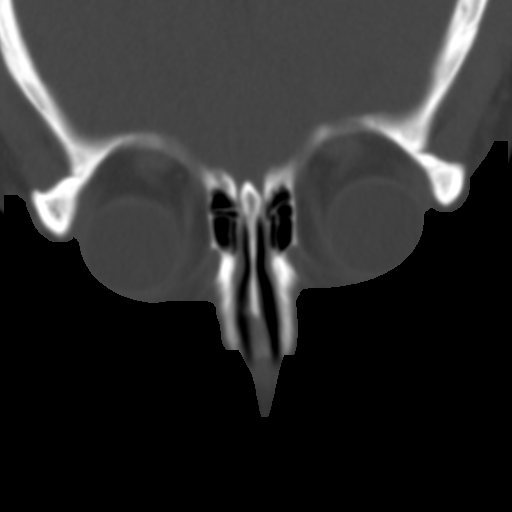
[im 9/11  bone]
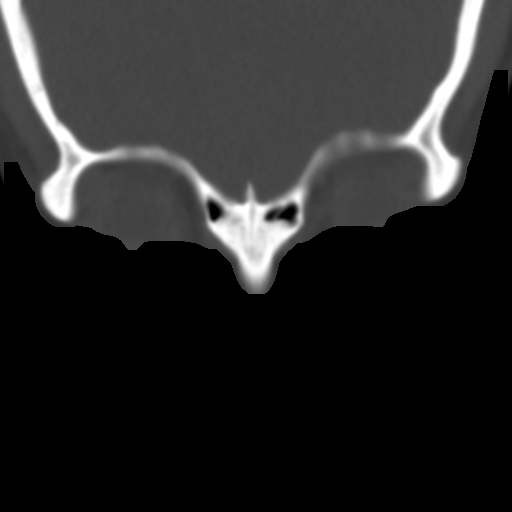
[im 10/11  brain]
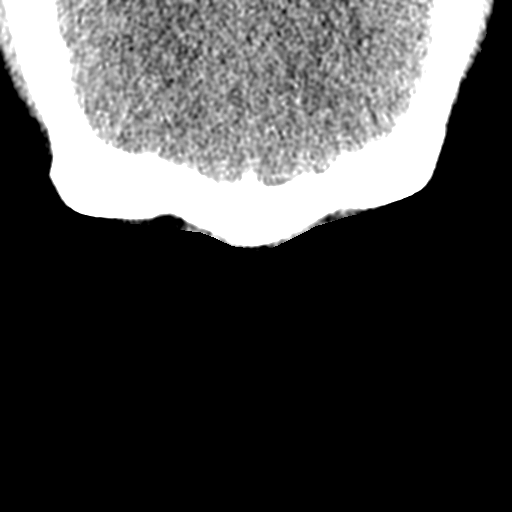
[im 10/11  bone]
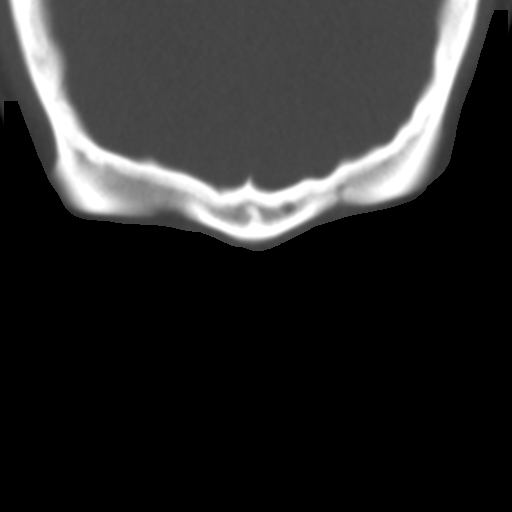

[9 of 11 positions shown; findings below may reference images not displayed]

FINDINGS: The maxillary, sphenoid, ethmoid, and frontal sinuses are clear.
Midline nasal septum. TMJs located. No nasal cavity masses. Negative
visualized intracranial compartment.
IMPRESSION: Negative limited paranasal sinus CT.

## 2017-02-13 IMAGING — MR MR HEAD WO/W CM
12 series · 48 of 48 positions shown · IV contrast (19 ML MULTIHANCE)
Comparison: None.

CLINICAL DATA: Worsening headaches.  Visual disturbance.

EXAM:
MRI HEAD WITHOUT AND WITH CONTRAST
TECHNIQUE: Multiplanar, multiecho pulse sequences of the brain and surrounding
structures were obtained without and with intravenous contrast.
CONTRAST:  19mL MULTIHANCE GADOBENATE DIMEGLUMINE 529 MG/ML IV SOLN

[Series 2: T1 · sagittal · 5.0mm · 0.45mm/px · 2 of 23 slices shown (1 of 2)]
[im 1/23]
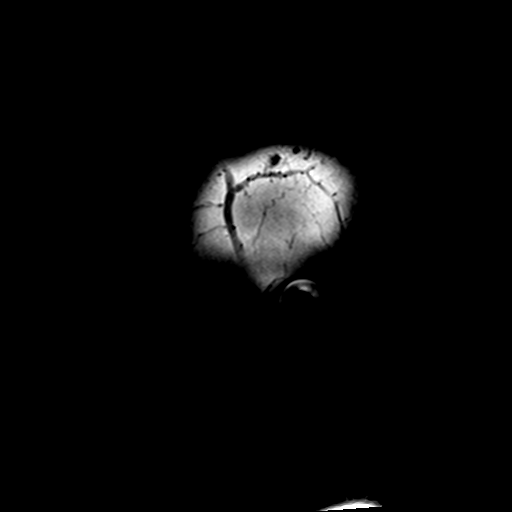
[im 23/23]
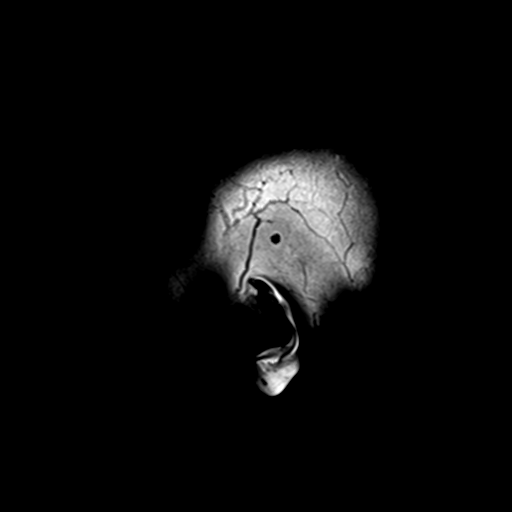

[Series 4: DWI · axial · 3.0mm · 1.20mm/px · z∈[-84,+76]mm · 6 of 55 slices shown (1 of 4)]
[im 1/55]
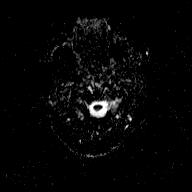
[im 11/55]
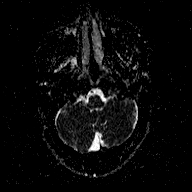
[im 22/55]
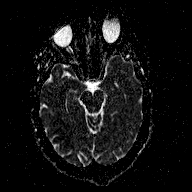
[im 33/55]
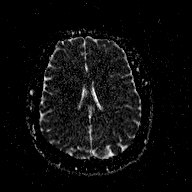
[im 44/55]
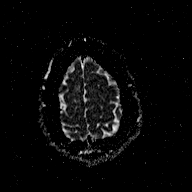
[im 55/55]
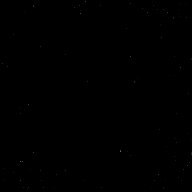

[Series 6: DWI · coronal · 3.0mm · 1.20mm/px · 5 of 49 slices shown (2 of 4)]
[im 1/49]
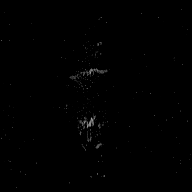
[im 13/49]
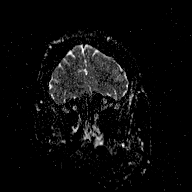
[im 25/49]
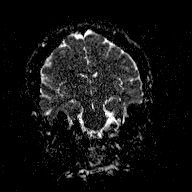
[im 37/49]
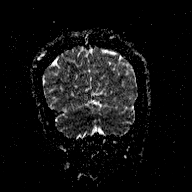
[im 49/49]
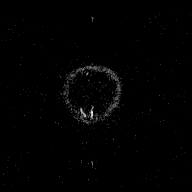

[Series 7: T2 · axial · 5.0mm · 0.72mm/px · z∈[-86,+67]mm · 2 of 25 slices shown (1 of 2)]
[im 1/25]
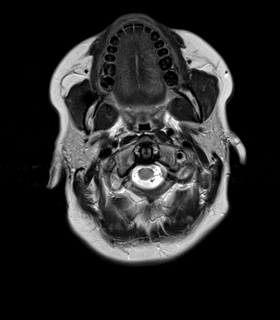
[im 25/25]
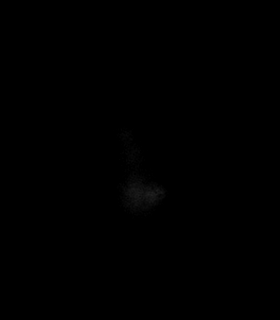

[Series 8: FLAIR · axial · 5.0mm · 0.45mm/px · z∈[-86,+67]mm · 2 of 25 slices shown]
[im 1/25]
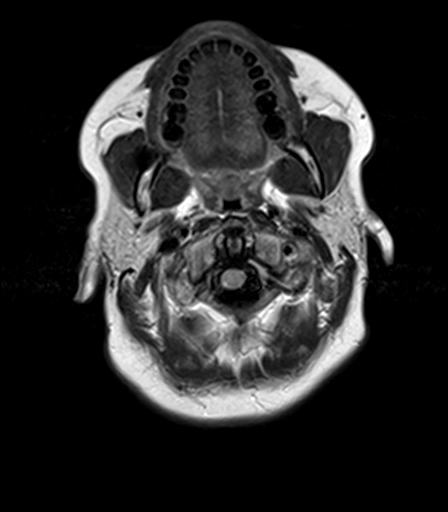
[im 25/25]
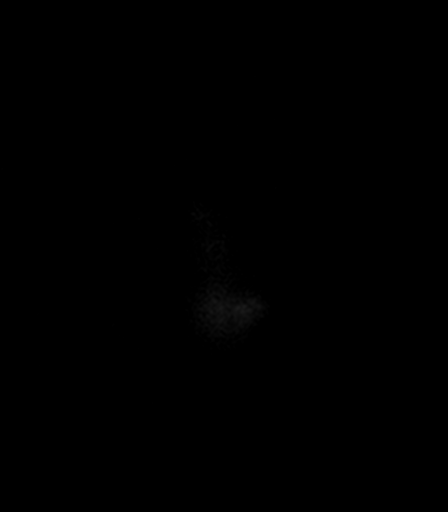

[Series 9: T2 · axial · 5.0mm · 0.72mm/px · z∈[-86,+67]mm · 2 of 25 slices shown (2 of 2)]
[im 1/25]
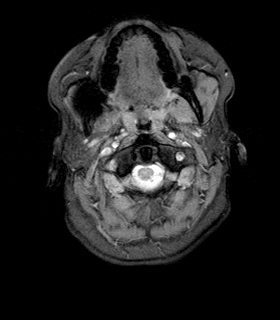
[im 25/25]
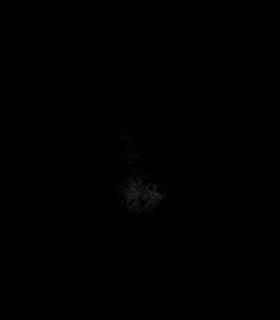

[Series 10: T1 · axial · 3.0mm · 1.00mm/px · z∈[-93,+69]mm · 6 of 56 slices shown (2 of 2)]
[im 1/56]
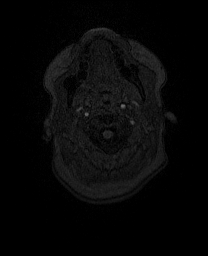
[im 12/56]
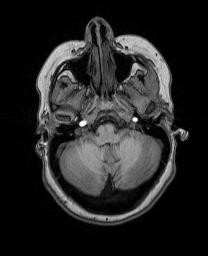
[im 23/56]
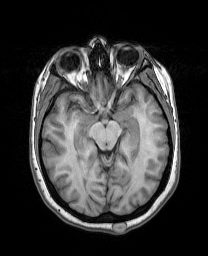
[im 34/56]
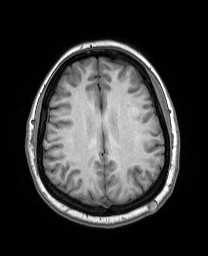
[im 45/56]
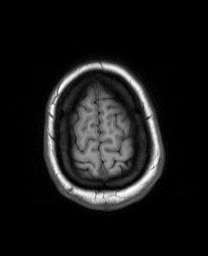
[im 56/56]
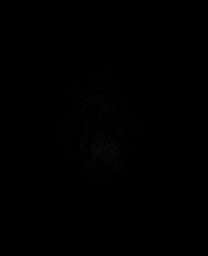

[Series 11: T2 post-contrast · coronal · 5.0mm · 0.45mm/px · 3 of 31 slices shown]
[im 1/31]
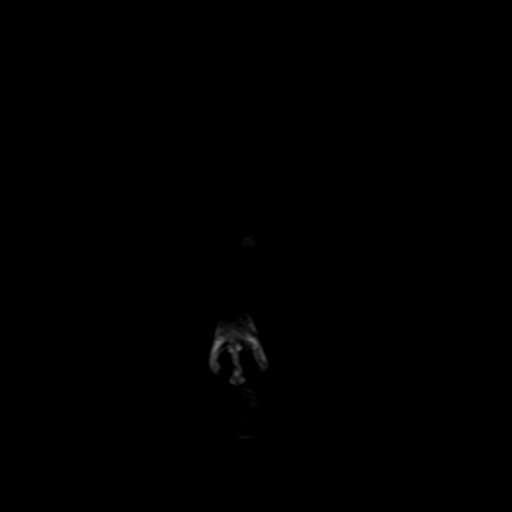
[im 16/31]
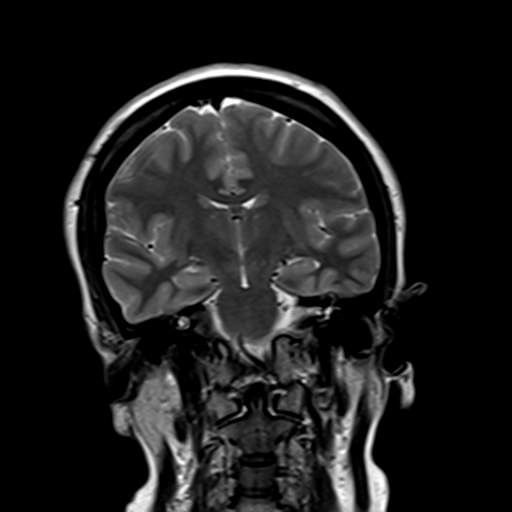
[im 31/31]
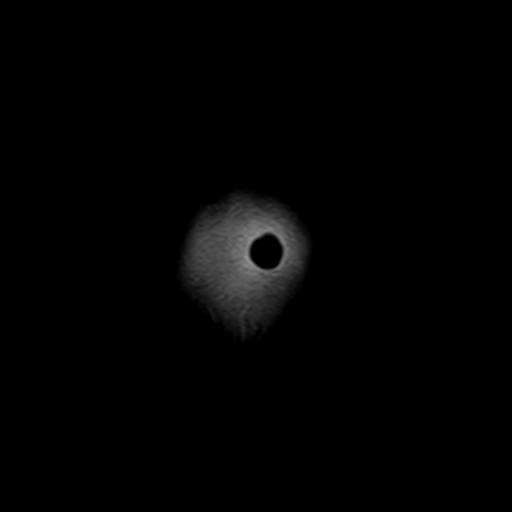

[Series 12: T1 post-contrast · axial · 3.0mm · 1.00mm/px · z∈[-93,+69]mm · 6 of 56 slices shown (1 of 2)]
[im 1/56]
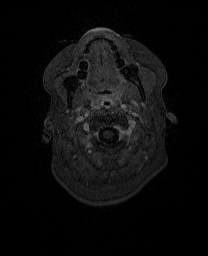
[im 12/56]
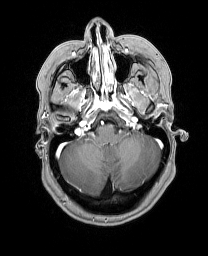
[im 23/56]
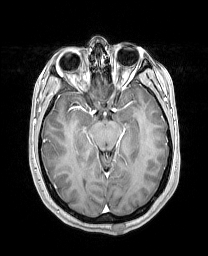
[im 34/56]
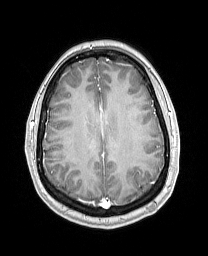
[im 45/56]
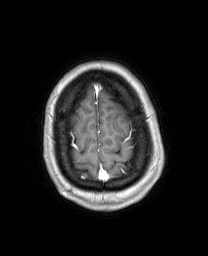
[im 56/56]
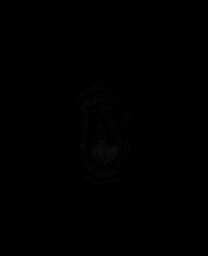

[Series 13: T1 post-contrast · coronal · 5.0mm · 0.45mm/px · 3 of 31 slices shown (2 of 2)]
[im 1/31]
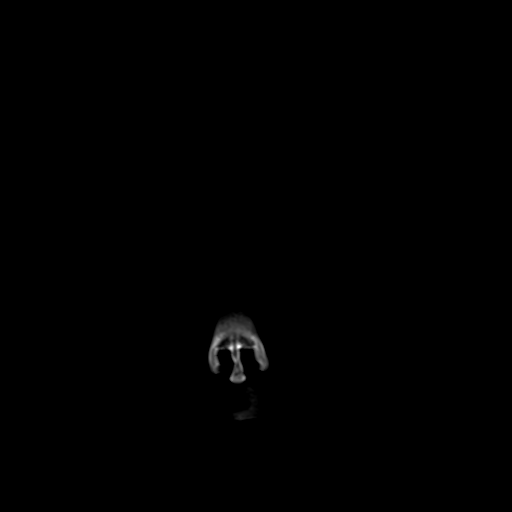
[im 16/31]
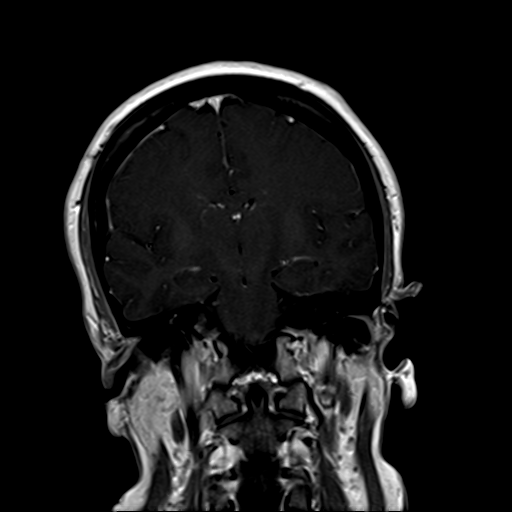
[im 31/31]
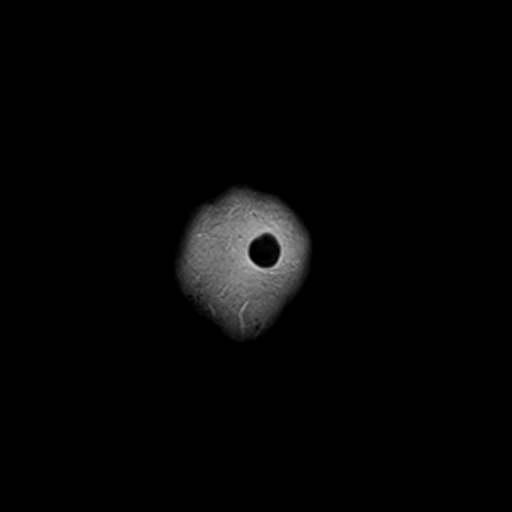

[Series 100: DWI · axial · 3.0mm · 1.20mm/px · z∈[-84,+76]mm · 6 of 55 slices shown (3 of 4)]
[im 1/55]
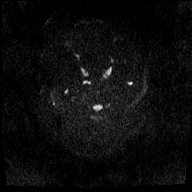
[im 11/55]
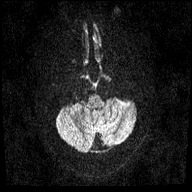
[im 22/55]
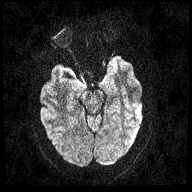
[im 33/55]
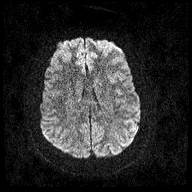
[im 44/55]
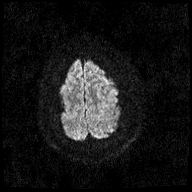
[im 55/55]
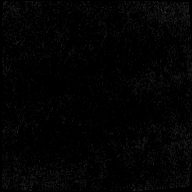

[Series 101: DWI · coronal · 3.0mm · 1.20mm/px · 5 of 49 slices shown (4 of 4)]
[im 1/49]
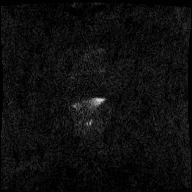
[im 13/49]
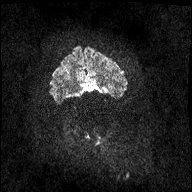
[im 25/49]
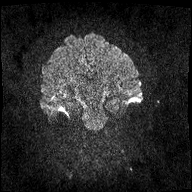
[im 37/49]
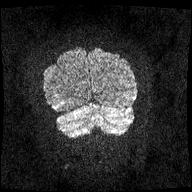
[im 49/49]
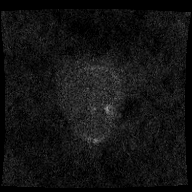

[48 of 48 positions shown; findings below may reference images not displayed]

FINDINGS: Brain: Ventricle size normal.  Cerebral volume normal.

Negative for acute or chronic infarction. Negative for demyelinating
disease. Negative for hemorrhage or mass. No shift of the midline
structures.

Normal enhancement following contrast infusion.

Vascular: Normal arterial flow voids.  Normal venous enhancement.

Skull and upper cervical spine: Negative

Sinuses/Orbits: Mild mucosal edema paranasal sinuses.  Normal orbit.

Other: Multiple cysts in the scalp bilaterally most compatible with
Jim cysts.
IMPRESSION: Normal MRI of the brain

Multiple scalp cysts compatible with Jim cysts.

## 2017-02-13 IMAGING — MR MR CERVICAL SPINE W/O CM
5 series · 35 of 48 positions shown · non-contrast
Comparison: None.

CLINICAL DATA: Right-sided neck pain. Cervical radiculopathy.
Headaches. Right arm pain.

EXAM:
MRI CERVICAL SPINE WITHOUT CONTRAST
TECHNIQUE: Multiplanar, multisequence MR imaging of the cervical spine was
performed. No intravenous contrast was administered.

[Series 2: T2 · sagittal · 3.0mm · 0.56mm/px · 8 of 13 slices shown (1 of 2)]
[im 1/13]
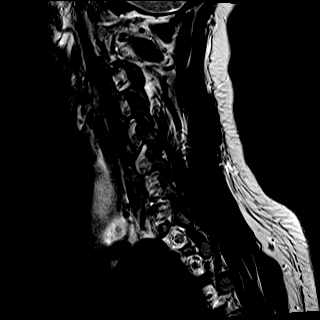
[im 2/13]
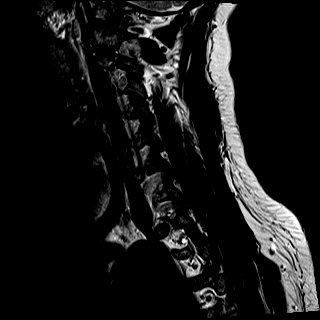
[im 4/13]
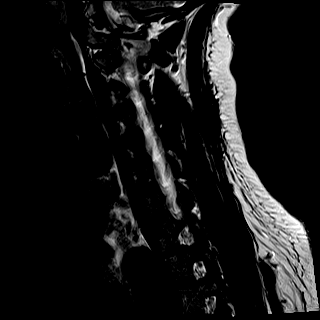
[im 6/13]
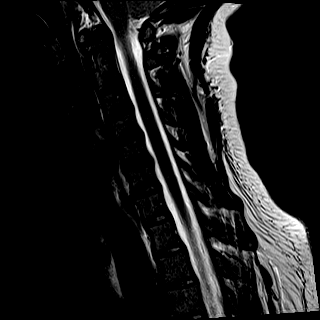
[im 7/13]
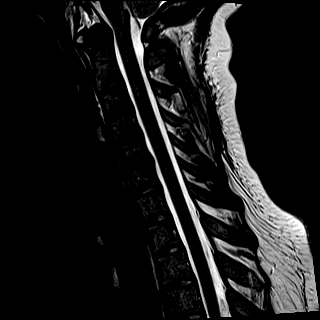
[im 9/13]
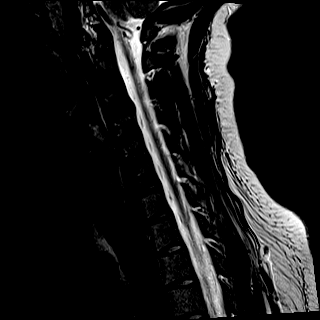
[im 11/13]
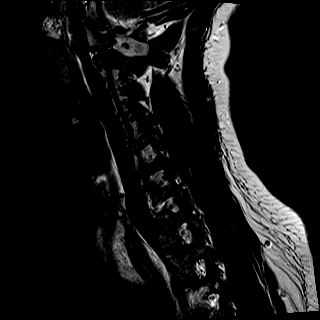
[im 13/13]
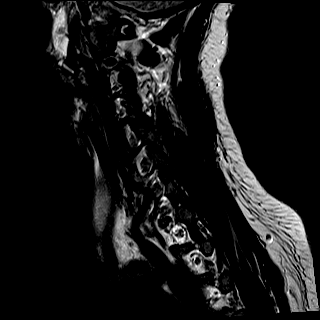

[Series 3: T1 · sagittal · 3.0mm · 0.70mm/px · 7 of 13 slices shown]
[im 1/13]
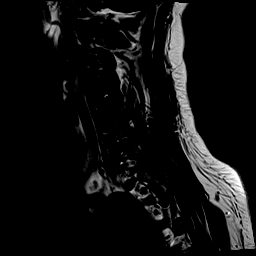
[im 3/13]
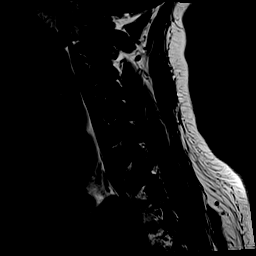
[im 5/13]
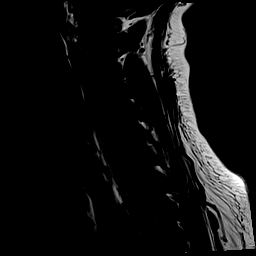
[im 7/13]
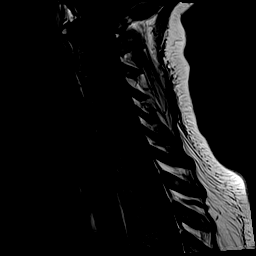
[im 9/13]
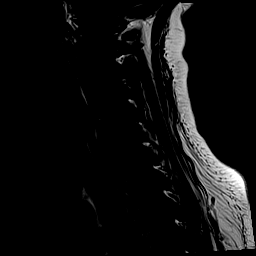
[im 11/13]
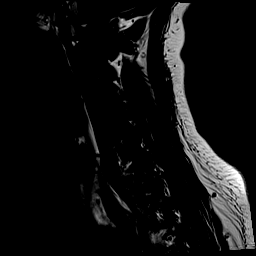
[im 13/13]
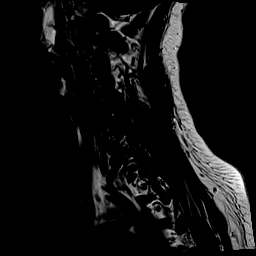

[Series 4: STIR · sagittal · 3.0mm · 0.35mm/px · 7 of 13 slices shown]
[im 1/13]
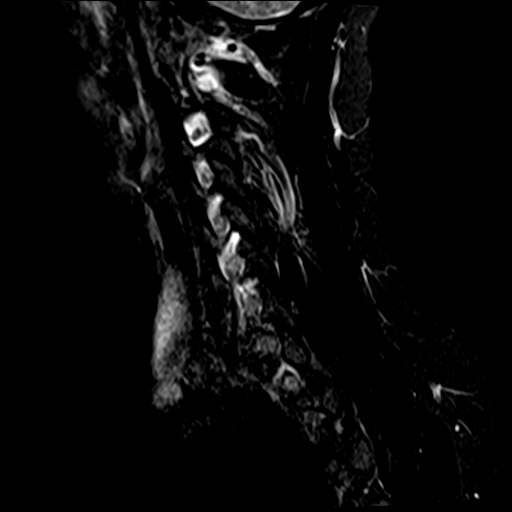
[im 3/13]
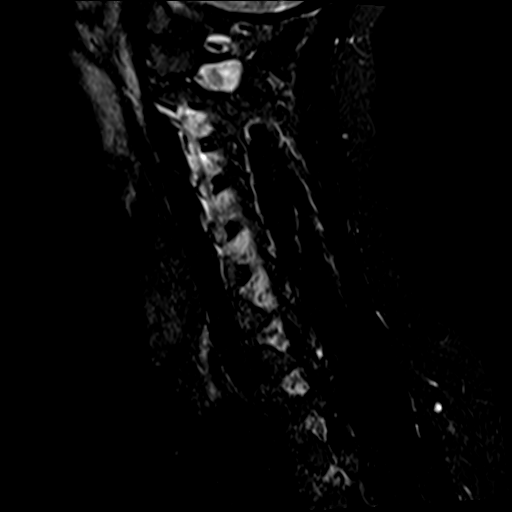
[im 5/13]
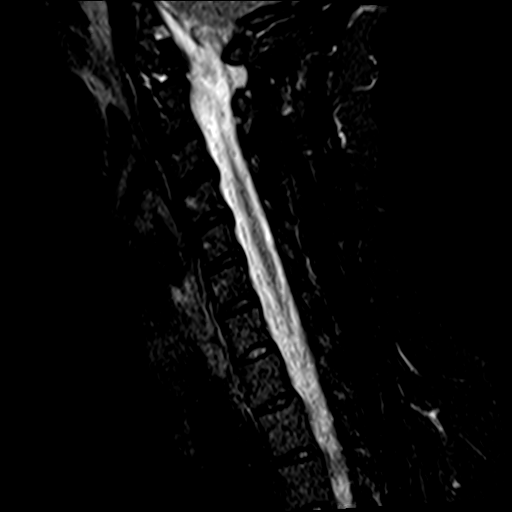
[im 7/13]
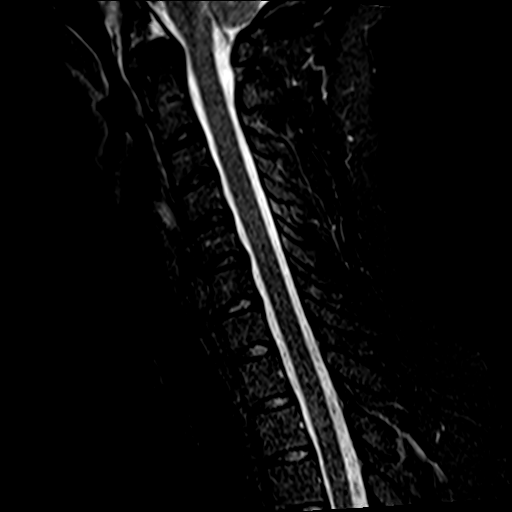
[im 9/13]
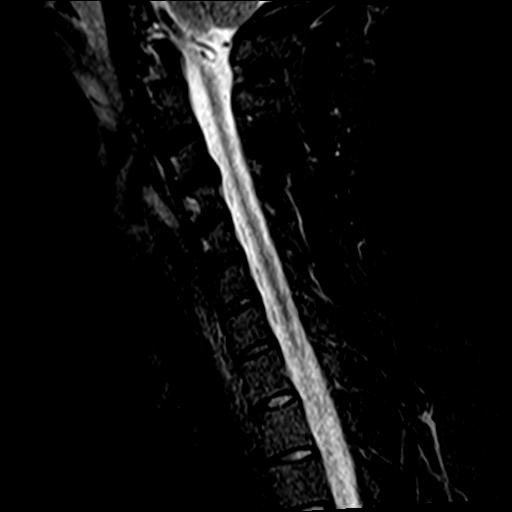
[im 11/13]
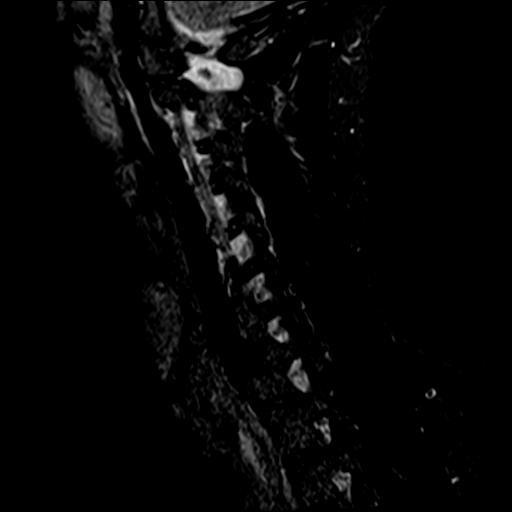
[im 13/13]
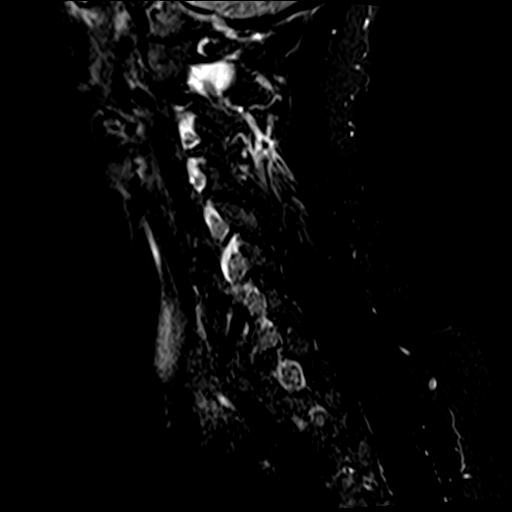

[Series 5: T2 · axial · 3.0mm · 0.62mm/px · z∈[-65,+23]mm · 9 of 25 slices shown (2 of 2)]
[im 1/25]
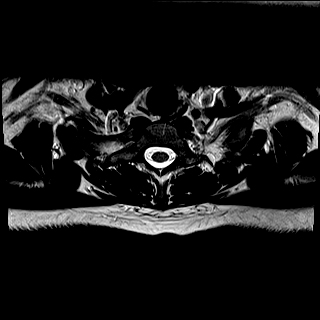
[im 5/25]
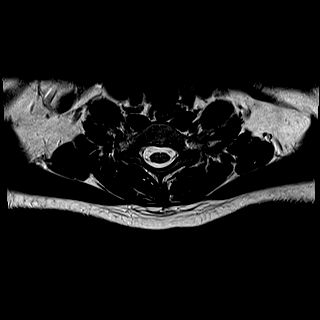
[im 9/25]
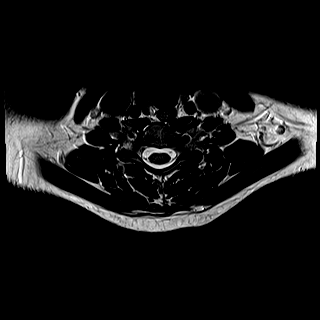
[im 11/25]
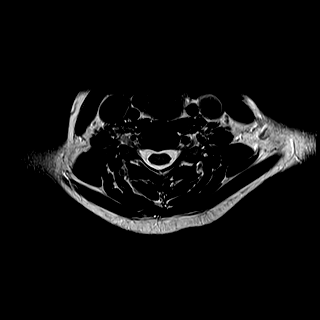
[im 13/25]
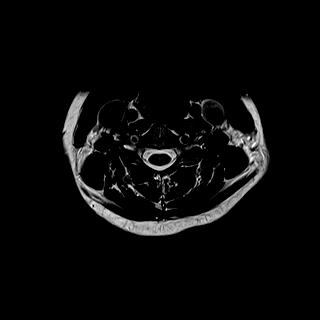
[im 15/25]
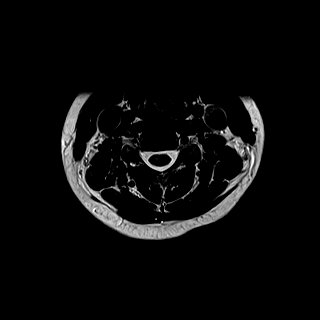
[im 17/25]
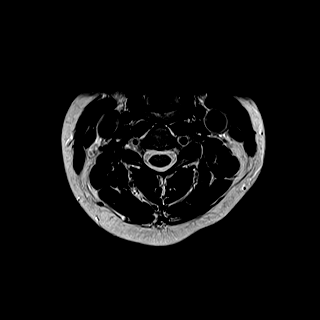
[im 21/25]
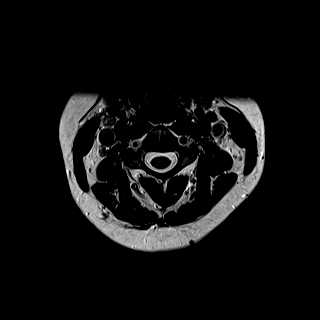
[im 25/25]
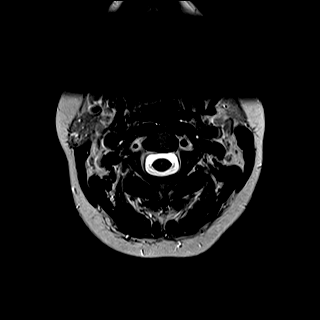

[Series 6: mpgr ax · axial · 3.0mm · 0.35mm/px · z∈[-53,-17]mm · 4 of 25 slices shown]
[im 1/25]
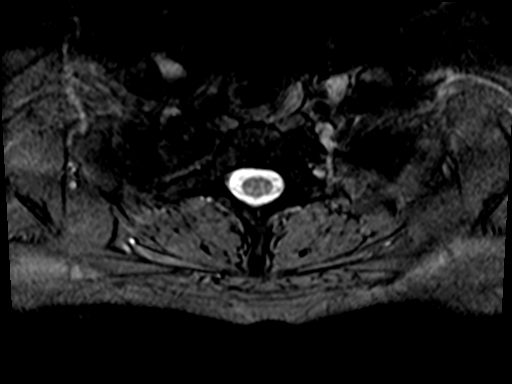
[im 5/25]
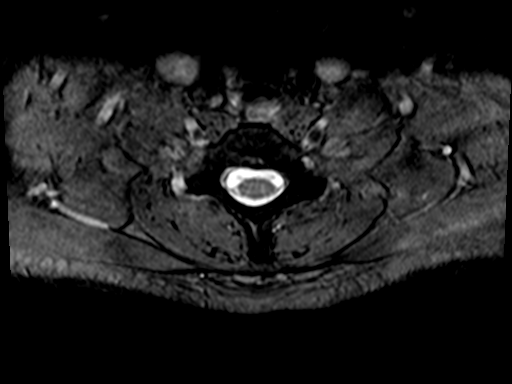
[im 9/25]
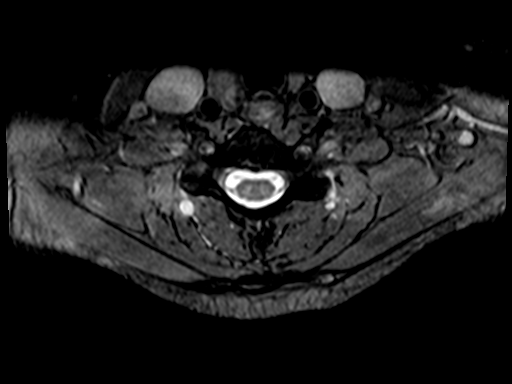
[im 11/25]
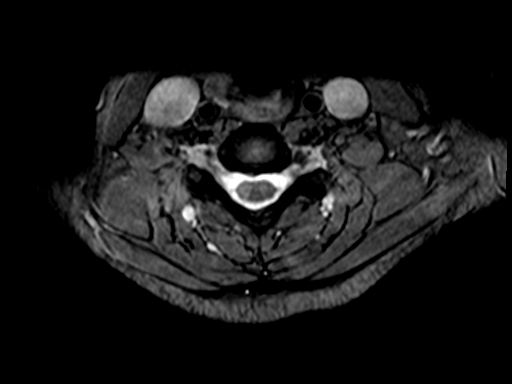

[35 of 48 positions shown; findings below may reference images not displayed]

FINDINGS: Alignment: Normal.

Vertebrae: Normal.  No facet arthritis in the cervical spine.

Cord: Normal.

Posterior Fossa, vertebral arteries, paraspinal tissues: Normal.

Disc levels:

Craniocervical junction through C2-3:  Normal.

C3-4: Slight uncinate spurring to the left with slight narrowing of
the left neural foramen. The disc appears normal.

C4-5: Normal.

C5-6: Tiny central disc bulge with no neural impingement. Widely
patent neural foramina.

C6-7 through T2-3: Normal.
IMPRESSION: No significant abnormality of the cervical spine.

## 2017-04-12 IMAGING — DX DG CHEST 2V
2 series · 2 of 2 positions shown · non-contrast
Comparison: 05/15/2015.

CLINICAL DATA: Productive cough for 1 month.

EXAM:
CHEST  2 VIEW

[chest pa]
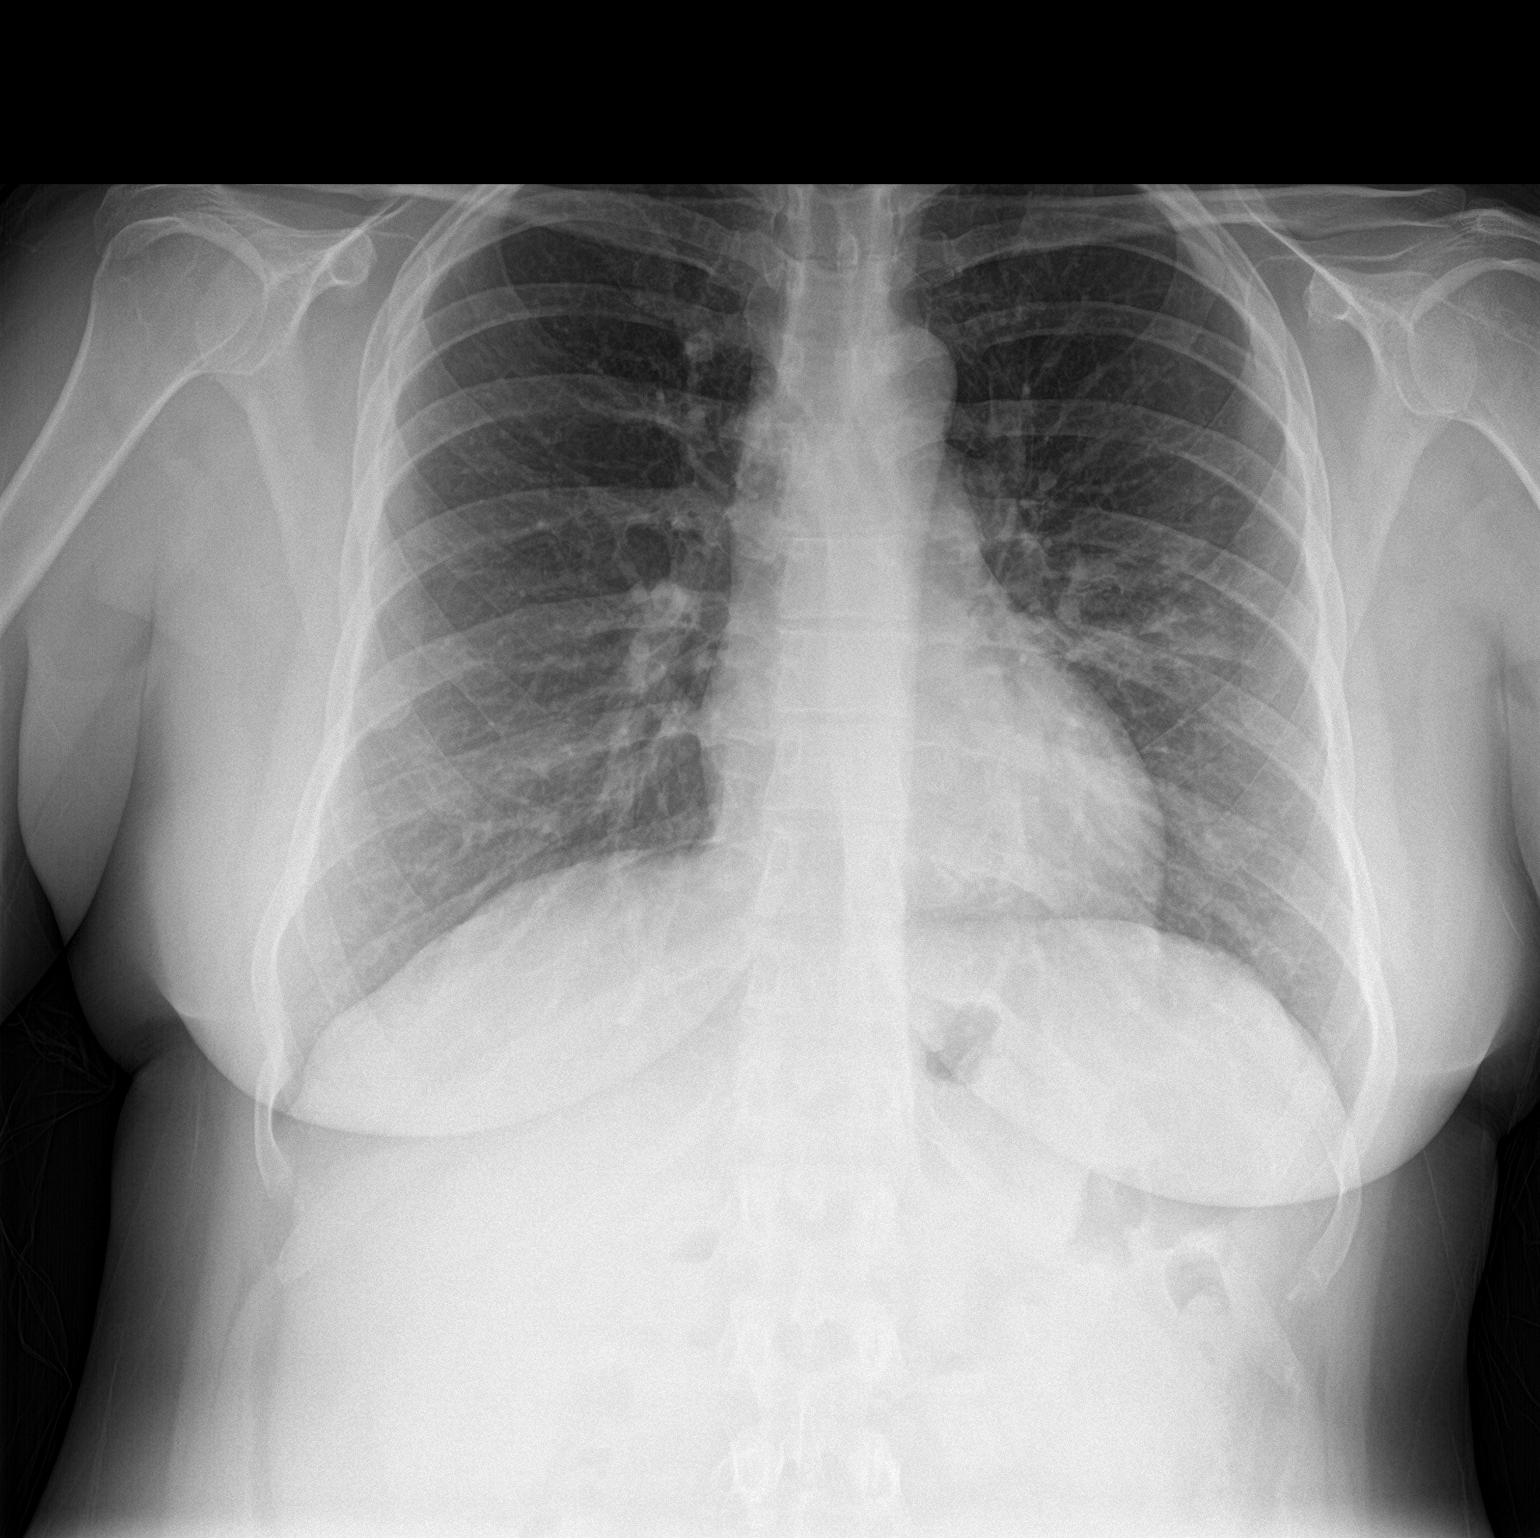

[chest lat]
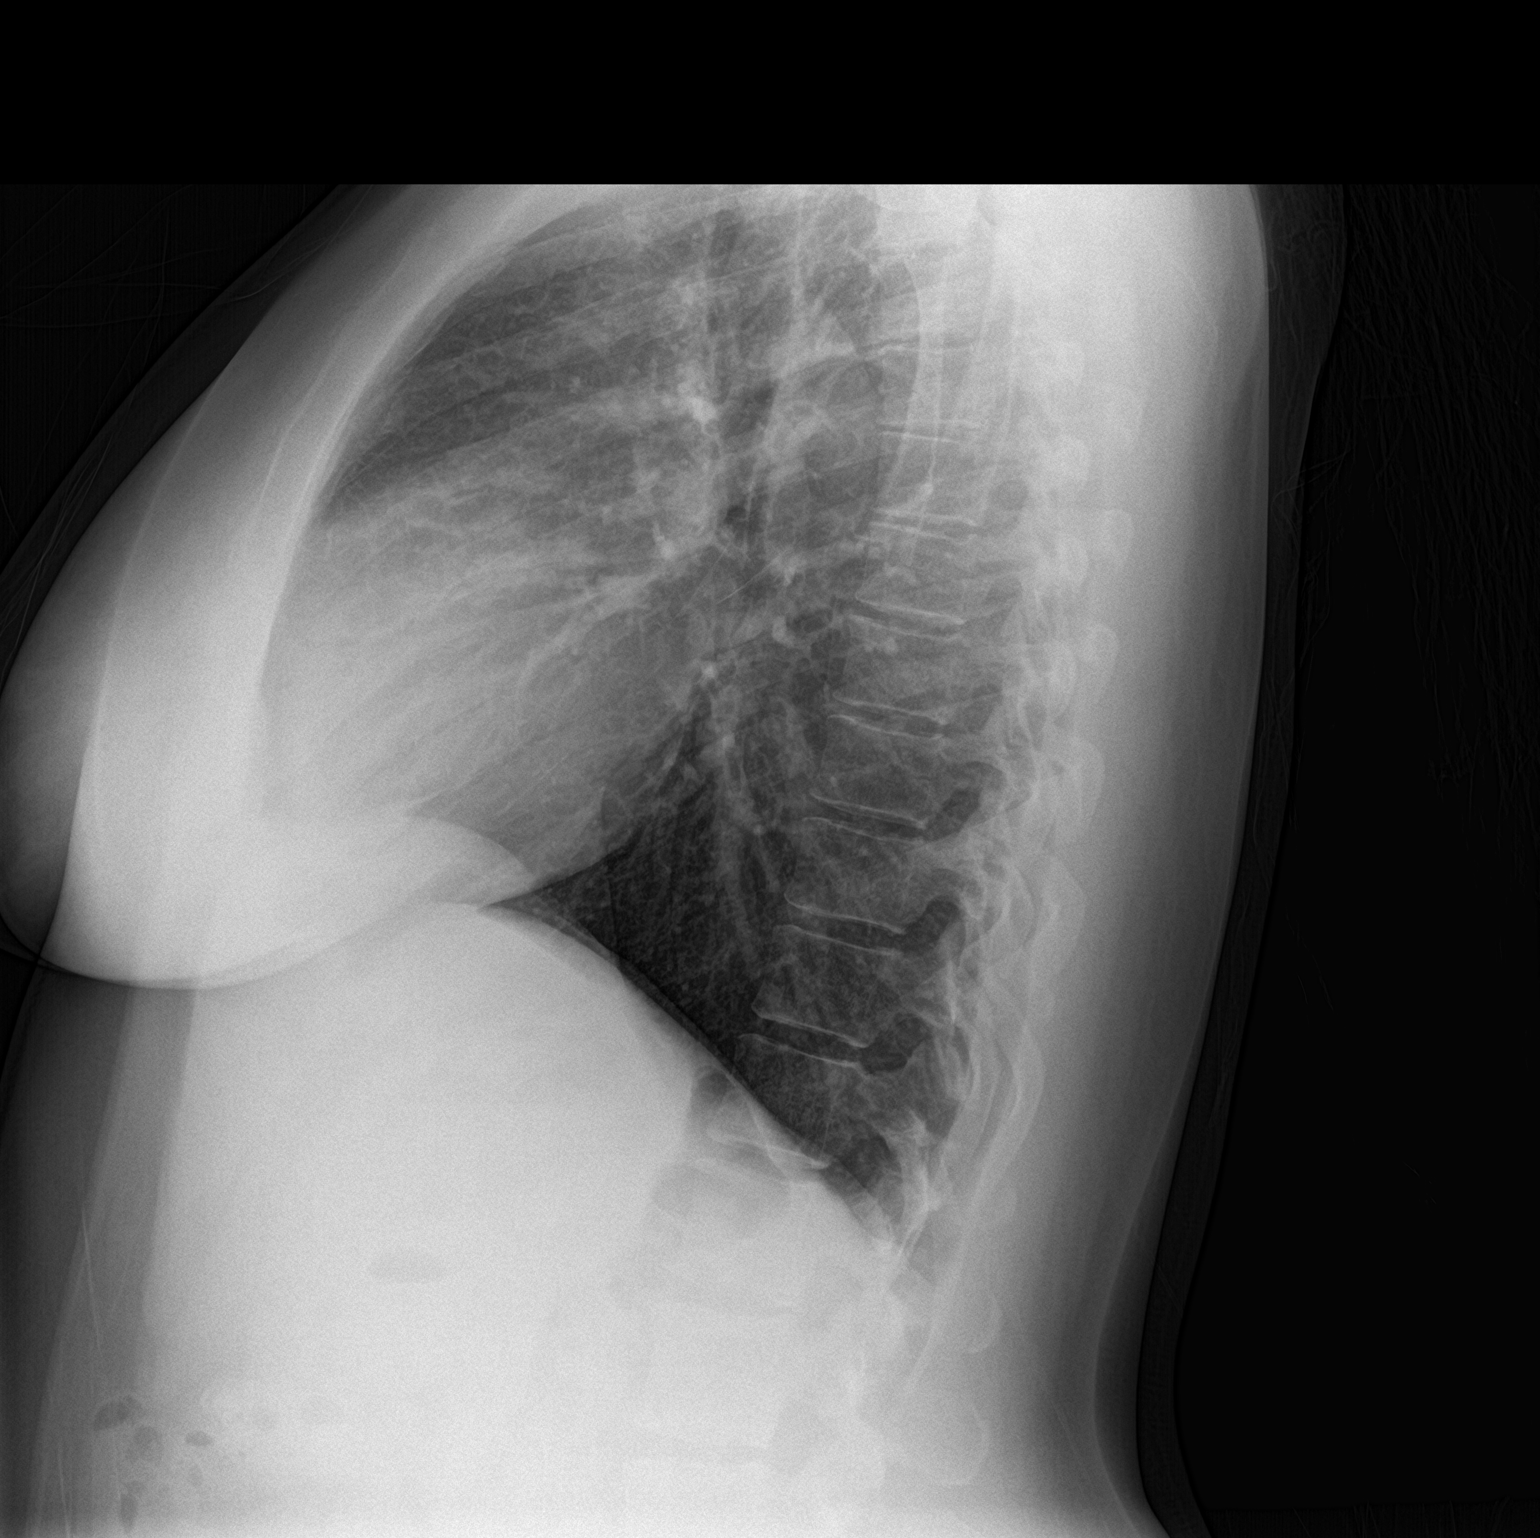

[2 of 2 positions shown; findings below may reference images not displayed]

FINDINGS: The heart size and mediastinal contours are within normal limits.
There is a faint infiltrate LEFT mid lung zone, probably lingular in
location. No effusion or pneumothorax. No similar findings on the
RIGHT. There are no osseous findings.
IMPRESSION: Faint infiltrate LEFT upper lobe is new from priors. Recommend
repeat radiograph in 4-6 weeks to ensure clearing.

## 2017-06-05 IMAGING — US US ABDOMEN LIMITED
1 series · 14 of 25 positions shown · non-contrast
Comparison: Abdominal and pelvic CT scan dated February 23, 2012

CLINICAL DATA: Right upper quadrant pain with nausea, anorexia,
full sensation for the past 2-3 weeks. History of gallstones.

EXAM:
US ABDOMEN LIMITED - RIGHT UPPER QUADRANT

[Series 1: us abdomen limited · 0.18mm/px · 14 of 46 slices shown]
[im 1/46]
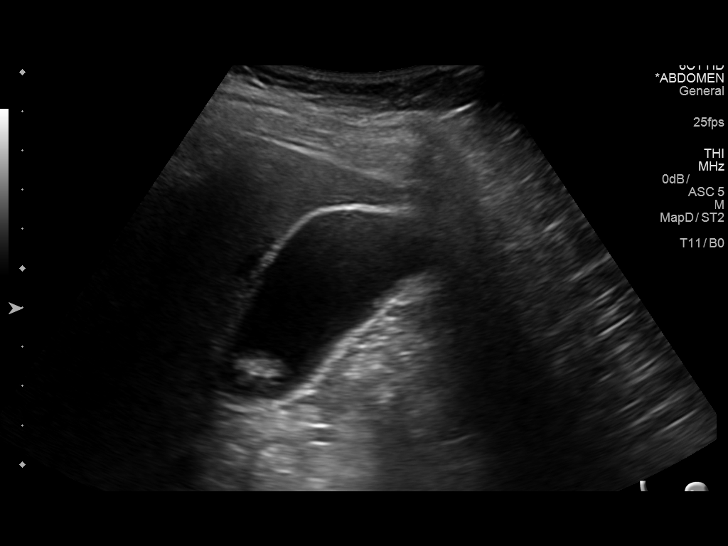
[im 4/46]
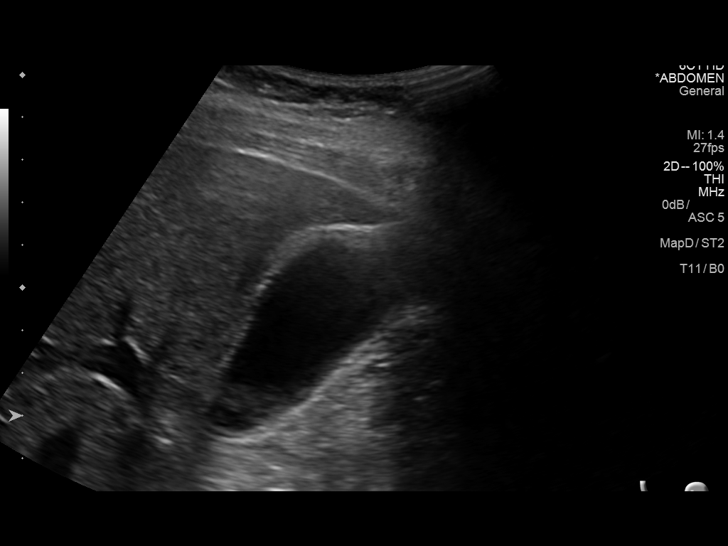
[im 8/46]
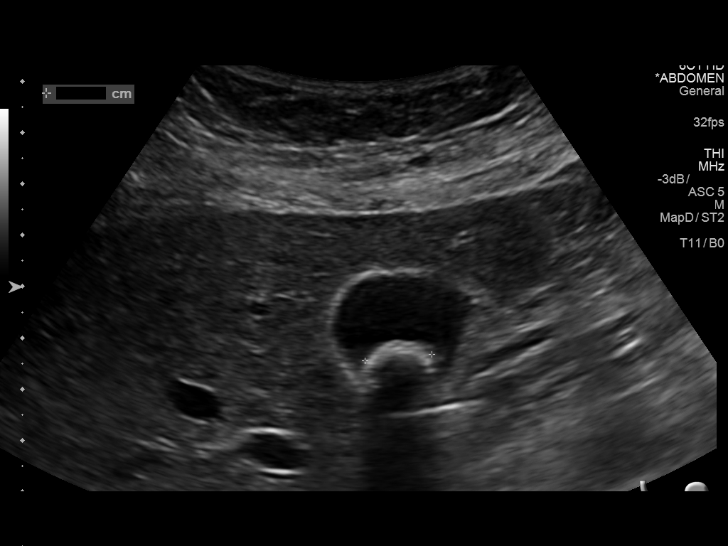
[im 12/46]
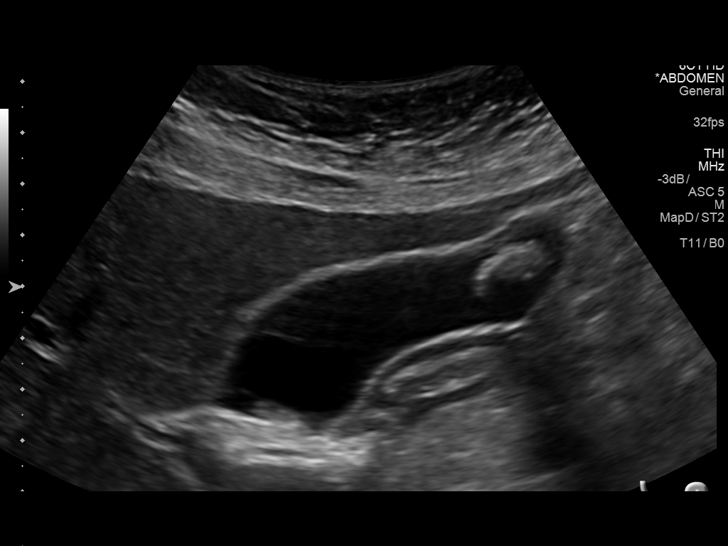
[im 16/46]
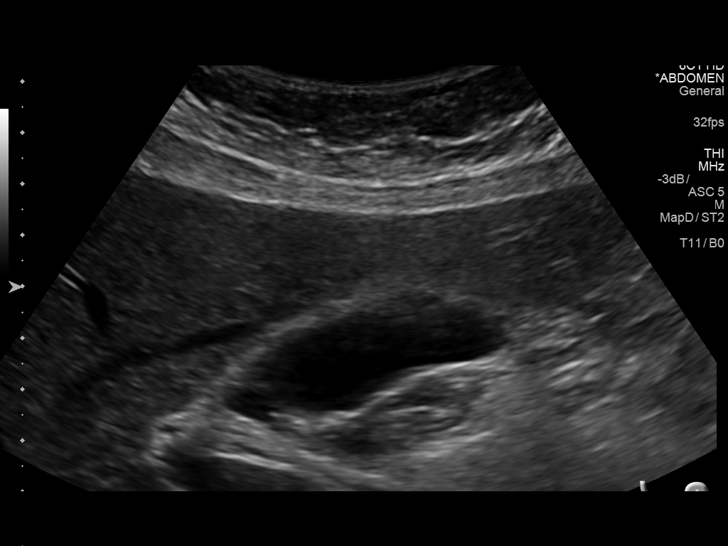
[im 17/46]
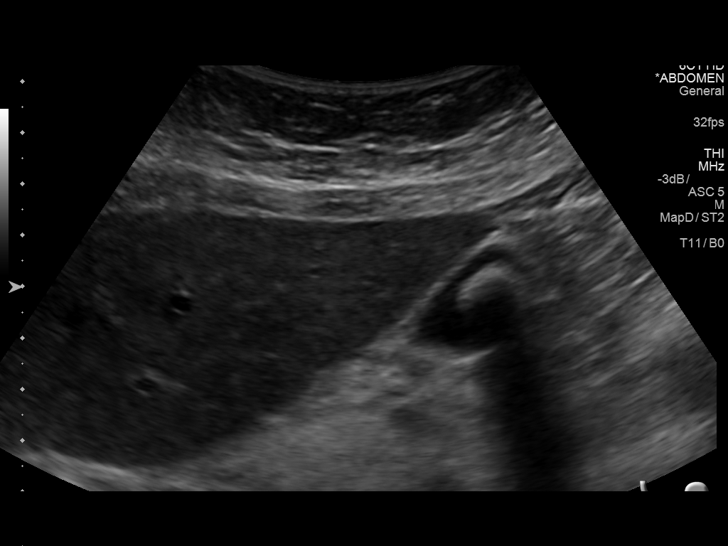
[im 21/46]
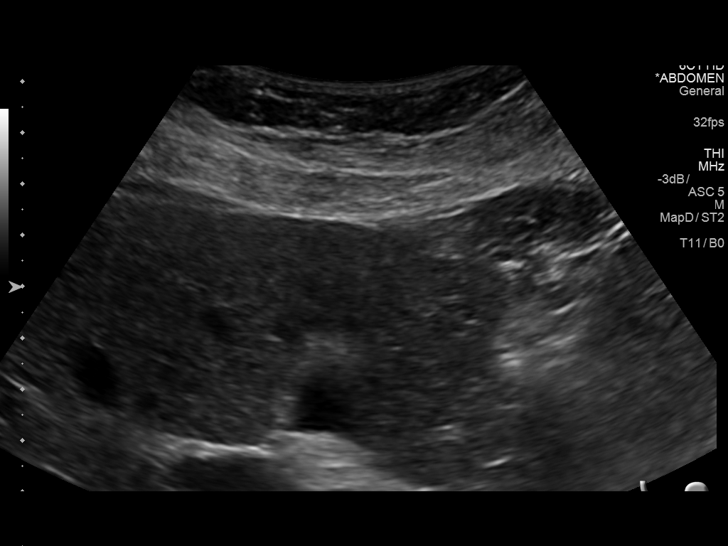
[im 25/46]
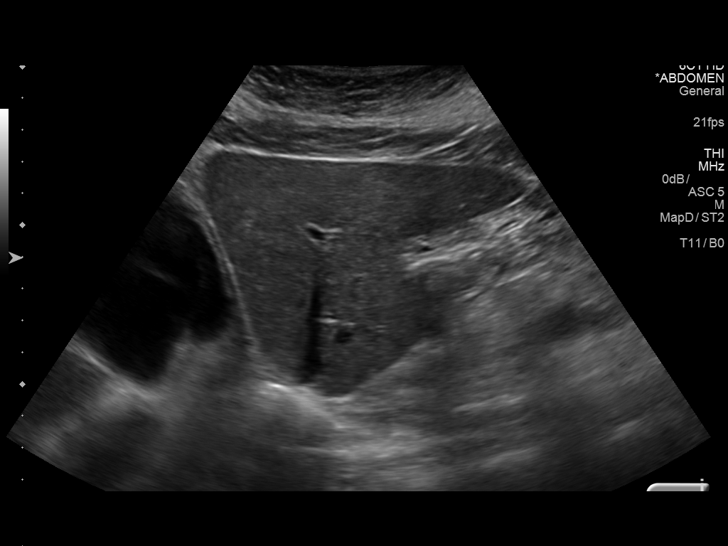
[im 29/46]
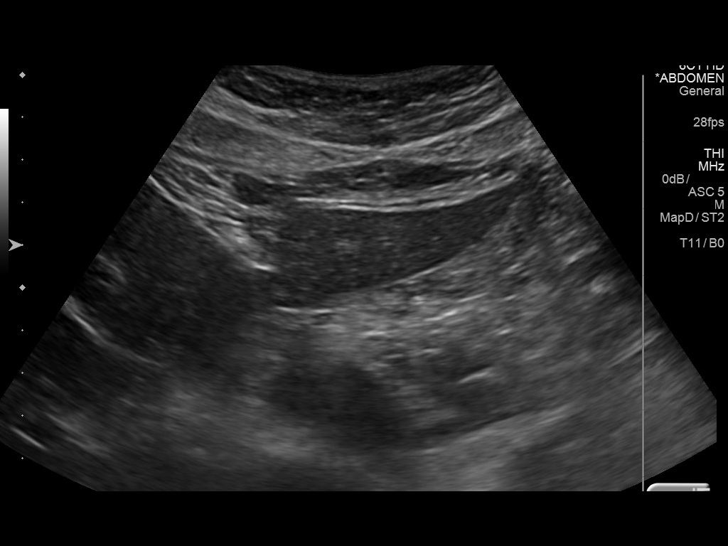
[im 31/46]
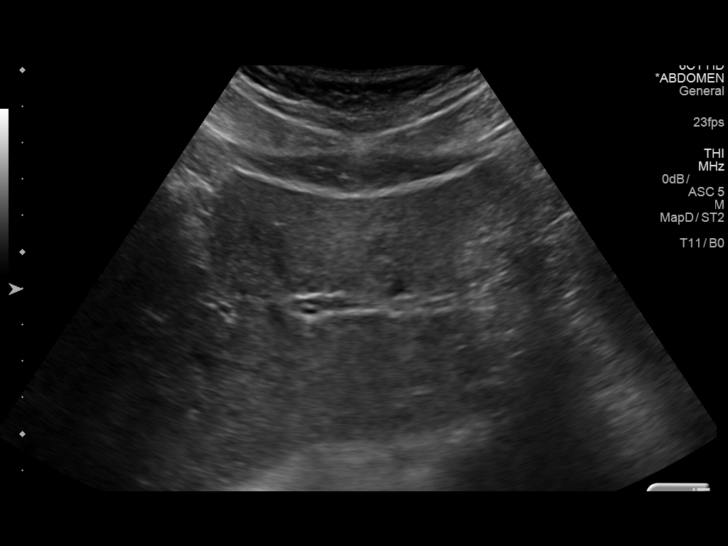
[im 34/46]
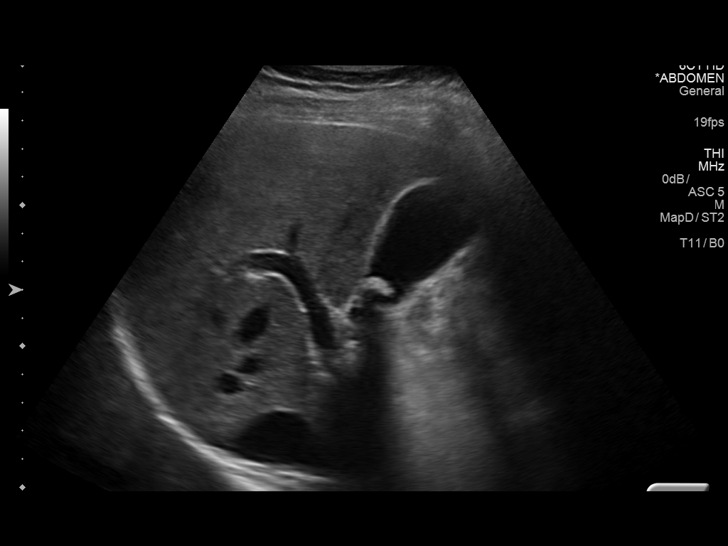
[im 38/46]
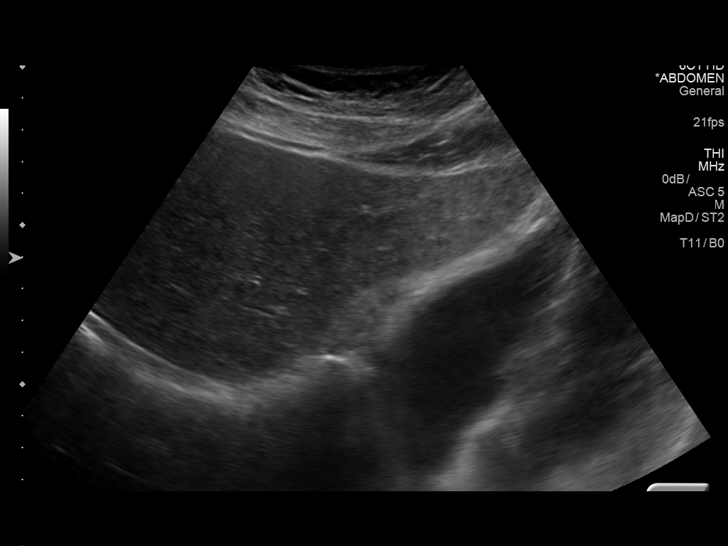
[im 42/46]
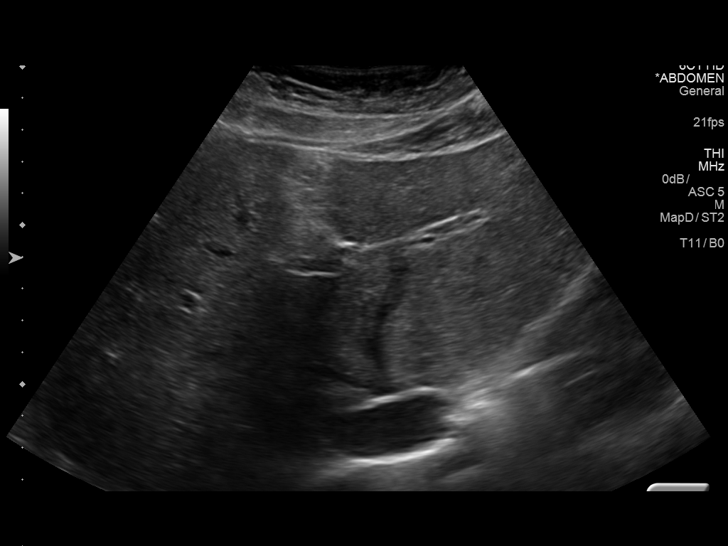
[im 46/46]
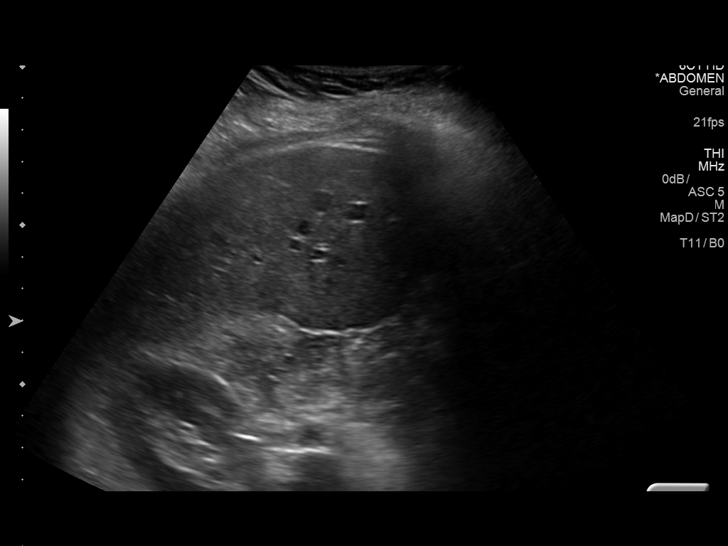

[14 of 25 positions shown; findings below may reference images not displayed]

FINDINGS: Gallbladder:

There is a mobile shadowing gallstone measuring 1.3 cm in diameter.
There is no gallbladder wall thickening, pericholecystic fluid, or
positive sonographic Murphy's sign.

Common bile duct:

Diameter: 1.8 mm

Liver:

The hepatic echotexture is normal. There is no focal mass or ductal
dilation.
IMPRESSION: Gallstones without sonographic evidence of acute cholecystitis.
Normal appearance of the liver and common bile duct.

## 2017-07-31 ENCOUNTER — Ambulatory Visit (INDEPENDENT_AMBULATORY_CARE_PROVIDER_SITE_OTHER): Payer: 59 | Admitting: Osteopathic Medicine

## 2017-07-31 ENCOUNTER — Encounter: Payer: Self-pay | Admitting: Osteopathic Medicine

## 2017-07-31 VITALS — BP 110/62 | HR 77 | Temp 98.2°F | Wt 188.2 lb

## 2017-07-31 DIAGNOSIS — Z0000 Encounter for general adult medical examination without abnormal findings: Secondary | ICD-10-CM

## 2017-07-31 DIAGNOSIS — Z1321 Encounter for screening for nutritional disorder: Secondary | ICD-10-CM

## 2017-07-31 DIAGNOSIS — Z8669 Personal history of other diseases of the nervous system and sense organs: Secondary | ICD-10-CM

## 2017-07-31 DIAGNOSIS — Z8349 Family history of other endocrine, nutritional and metabolic diseases: Secondary | ICD-10-CM

## 2017-07-31 NOTE — Patient Instructions (Addendum)
General Preventive Care  Most recent routine screening labs: done today, results pending.   Tobacco: don't! Alcohol: moderation is ok for most people. Recreational/Illicit Drugs: don't!   Exercise: as tolerated to reduce risk of cardiovascular disease and diabetes  Mental health: if need for mental health care (medicines, counseling, other), or concerns about moods, please let me know!   Sexual health: if need for pregnancy prevention or STD testing, libido or pain concerns, please let me know!   Vaccines  Flu vaccine: recommended every fall (by Halloween!)  Shingles vaccine: Shingrix recommended after age 34  Pneumonia vaccines: Prevnar and Pneumovax recommended after age 39  Tetanus booster: Tdap recommended every 10 years - you're good until 2025  Cancer screenings   Colon cancer screening: recommended at age 72, colonoscopy sooner if risk factors   Breast cancer screening: mammogram recommended at age 12  Cervical cancer screening: every 1 to 5 years depending on age and other risk factors.  Infection screenings . HIV: recommended screening at least once age 45-65 . Gonorrhea/Chlamydia: screening as needed . Hepatitis C: recommended for anyone born 42-1966  Other . Aspirin: don't worry, you don't need this! Donzetta Kohut Density Test: recommended for women at age 83, sooner depending on risk factors . Advanced Directive: Living Will and/or Healthcare Power of Attorney recommended for everyone, regardless of age or health . Diabetes & Cholesterol: recommended screening annually for most people  . Thyroid and Vitamin D: routine screening not recommended, most insurance will not cover this test

## 2017-07-31 NOTE — Progress Notes (Signed)
HPI: Michelle Moody is a 32 y.o. female who  has a past medical history of Anemia (07/06/2013), Anxiety, Diverticulosis, Elevated blood pressure, Gallstones, History of dental surgery, Hyperlipidemia, IBS (irritable bowel syndrome), Migraines, Obese, Post-operative nausea and vomiting, and Rectal bleeding.  she presents to [facility] today, 07/31/17,  for chief complaint of: Annual Check-up  Patient here for annual physical / wellness exam.  See preventive care reviewed as below.   Additional concerns today include:  None. Doing well. Will be building a house this summer! Outside with the kids a good bit.   Brief review chronic issues:  Migraines: none in some time! Will leave Frova and Phenergan on med list just in case needs refills  Husband planning on vasectomy  IBS stable   Seen by Cardio last year for palpitations, no concerns   Pap w/ last pregnancy      Past medical, surgical, social and family history reviewed:  Patient Active Problem List   Diagnosis Date Noted  . Abnormal ECG 02/17/2016  . Heart palpitations 02/15/2016  . Right bundle branch block (RBBB) on electrocardiogram (ECG) 02/15/2016  . 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 Intractable migraine without aura and without status migrainosus 10/14/2014  . Chronic tension-type headache, not intractable 10/14/2014  . Neck pain 10/14/2014  . Weight gain 08/21/2014  . Left first tarsometatarsal synovitis 03/27/2014  . Dermoid cyst of scalp 03/27/2014  . Anemia 07/06/2013  . 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 Gallstones, current 07/04/2013  . Migraine 07/04/2013  . 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 Obese 07/04/2013  . Hyperlipidemia 07/04/2013    Past Surgical History:  Procedure Laterality Date  . CYST EXCISION     14 removed from scalp  . DENTAL SURGERY      Social History   Tobacco Use  . Smoking status: Never Smoker  . Smokeless tobacco: Never Used   Substance Use Topics  . Alcohol use: No    Alcohol/week: 0.0 oz    Family History  Problem Relation Age of Onset  . Cancer Father 55       prostate  . Hyperlipidemia Father   . Hypertension Father   . Alzheimer's disease Father   . Dementia Father   . Cancer Maternal Grandmother        ovarian  . Alcohol abuse Maternal Grandfather   . Cancer Maternal Grandfather        lung- smoker  . Stroke Paternal Grandmother   . Alzheimer's disease Paternal Grandmother   . Dementia Paternal Grandmother   . Heart disease Paternal Grandfather   . Hyperlipidemia Paternal Grandfather   . Hypertension Paternal Grandfather   . Thyroid disease Mother   . Colon polyps Mother   . Hyperlipidemia Brother   . Cancer Paternal Aunt 87       colon  . Crohn's disease Paternal Uncle      Current medication list and allergy/intolerance information reviewed:    Current Outpatient Medications  Medication Sig Dispense Refill  . GARLIC PO Take by mouth.    . Magnesium 400 MG TABS Take 1 tablet by mouth daily.    . Prenatal Vit-Fe Fumarate-FA (PRENATAL MULTIVITAMIN) TABS tablet Take 1 tablet by mouth daily at 12 noon.    . frovatriptan (FROVA) 2.5 MG tablet Take 1 tab BID starting 2 days prior to onset of expected migraine and for total of 6 days. (Patient not taking: Reported on 07/31/2017) 36 tablet 0  . promethazine (PHENERGAN)  25 MG tablet Take 1 tablet (25 mg total) by mouth every 6 (six) hours as needed for nausea. (Patient not taking: Reported on 07/31/2017) 15 tablet 1   No current facility-administered medications for this visit.     Allergies  Allergen Reactions  . Soy Allergy     Causes diarrhea  . Clindamycin Nausea And Vomiting  . Clindamycin/Lincomycin Nausea And Vomiting  . Latex Rash      Review of Systems:  Constitutional:  No  fever, no chills, No recent illness, No unintentional weight changes. No significant fatigue.   HEENT: No  headache, no vision change, no hearing  change, No sore throat, No  sinus pressure  Cardiac: No  chest pain, No  pressure, +occaisonal palpitations, No  Orthopnea  Respiratory:  No  shortness of breath. No  Cough  Gastrointestinal: No  abdominal pain, No  nausea, No  vomiting,  No  blood in stool, No  diarrhea, No  constipation   Musculoskeletal: No new myalgia/arthralgia  Skin: No  Rash, No other wounds/concerning lesions  Genitourinary: No  incontinence, No  abnormal genital bleeding, No abnormal genital discharge  Hem/Onc: No  easy bruising/bleeding, No  abnormal lymph node  Endocrine: No cold intolerance,  No heat intolerance. No polyuria/polydipsia/polyphagia   Neurologic: No  weakness, No  dizziness, No  slurred speech/focal weakness/facial droop  Psychiatric: No  concerns with depression, No  concerns with anxiety, No sleep problems, No mood problems  Exam:  BP 110/62 (BP Location: Left Arm, Patient Position: Sitting, Cuff Size: Normal)   Pulse 77   Temp 98.2 F (36.8 C) (Oral)   Wt 188 lb 3.2 oz (85.4 kg)   LMP 07/07/2017   BMI 33.34 kg/m   Constitutional: VS see above. General Appearance: alert, well-developed, well-nourished, NAD  Eyes: Normal lids and conjunctive, non-icteric sclera  Ears, Nose, Mouth, Throat: MMM, Normal external inspection ears/nares/mouth/lips/gums. TM normal bilaterally. Pharynx/tonsils no erythema, no exudate. Nasal mucosa normal.   Neck: No masses, trachea midline. No thyroid enlargement. No tenderness/mass appreciated. No lymphadenopathy  Respiratory: Normal respiratory effort. no wheeze, no rhonchi, no rales  Cardiovascular: S1/S2 normal, no murmur, no rub/gallop auscultated. RRR. No lower extremity edema. Pedal pulse II/IV bilaterally DP and PT.   Gastrointestinal: Nontender, no masses. No hepatomegaly, no splenomegaly. No hernia appreciated. Bowel sounds normal. Rectal exam deferred.   Musculoskeletal: Gait normal. No clubbing/cyanosis of digits.   Neurological: Normal  balance/coordination. No tremor. No cranial nerve deficit on limited exam. Motor intact and symmetric. Cerebellar reflexes intact.   Skin: warm, dry, intact. No rash/ulcer. No concerning nevi or subq nodules on limited exam.    Psychiatric: Normal judgment/insight. Normal mood and affect. Oriented x3.    Immunization History  Administered Date(s) Administered  . Influenza Inj Mdck Quad Pf 10/13/2016  . Influenza,inj,Quad PF,6+ Mos 09/29/2015  . Influenza-Unspecified 10/17/2013  . Tdap 09/19/2012, 04/04/2013      ASSESSMENT/PLAN:  Annual physical exam - Plan: CBC, COMPLETE METABOLIC PANEL WITH GFR, Lipid panel, VITAMIN D 25 Hydroxy (Vit-D Deficiency, Fractures), TSH  History of migraine  Family history of thyroid disease - Plan: TSH  Encounter for vitamin deficiency screening - Plan: VITAMIN D 25 Hydroxy (Vit-D Deficiency, Fractures)    Patient Instructions  General Preventive Care  Most recent routine screening labs: done today, results pending.   Tobacco: don't! Alcohol: moderation is ok for most people. Recreational/Illicit Drugs: don't!   Exercise: as tolerated to reduce risk of cardiovascular disease and diabetes  Mental health: if  need for mental health care (medicines, counseling, other), or concerns about moods, please let me know!   Sexual health: if need for pregnancy prevention or STD testing, libido or pain concerns, please let me know!   Vaccines  Flu vaccine: recommended every fall (by Halloween!)  Shingles vaccine: Shingrix recommended after age 16  Pneumonia vaccines: Prevnar and Pneumovax recommended after age 27  Tetanus booster: Tdap recommended every 10 years - you're good until 2025  Cancer screenings   Colon cancer screening: recommended at age 64, colonoscopy sooner if risk factors   Breast cancer screening: mammogram recommended at age 5  Cervical cancer screening: every 1 to 5 years depending on age and other risk factors.  Infection  screenings . HIV: recommended screening at least once age 72-65 . Gonorrhea/Chlamydia: screening as needed . Hepatitis C: recommended for anyone born [birth year]-1965  Other . Aspirin: don't worry, you don't need this! Donzetta Kohut Density Test: recommended for women at age 70, sooner depending on risk factors . Advanced Directive: Living Will and/or Healthcare Power of Attorney recommended for everyone, regardless of age or health . Diabetes & Cholesterol: recommended screening annually for most people  . Thyroid and Vitamin D: routine screening not recommended, most insurance will not cover this test            Visit summary with medication list and pertinent instructions was printed for patient to review. All questions at time of visit were answered - patient instructed to contact office with any additional concerns. ER/RTC precautions were reviewed with the patient.   Follow-up plan: Return in about 1 year (around 08/01/2018) for annual check-up, sooner if needed .    Please note: voice recognition software was used to produce this document, and typos may escape review. Please contact Dr. Sheppard Coil for any needed clarifications.

## 2017-08-01 LAB — COMPLETE METABOLIC PANEL WITH GFR
AG RATIO: 1.9 (calc) (ref 1.0–2.5)
ALBUMIN MSPROF: 4.5 g/dL (ref 3.6–5.1)
ALKALINE PHOSPHATASE (APISO): 79 U/L (ref 33–115)
ALT: 10 U/L (ref 6–29)
AST: 12 U/L (ref 10–30)
BILIRUBIN TOTAL: 0.3 mg/dL (ref 0.2–1.2)
BUN: 17 mg/dL (ref 7–25)
CO2: 29 mmol/L (ref 20–32)
Calcium: 9.5 mg/dL (ref 8.6–10.2)
Chloride: 106 mmol/L (ref 98–110)
Creat: 0.72 mg/dL (ref 0.50–1.10)
GFR, Est African American: 128 mL/min/{1.73_m2} (ref >=60)
GFR, Est Non African American: 111 mL/min/{1.73_m2} (ref >=60)
GLOBULIN: 2.4 g/dL (ref 1.9–3.7)
Glucose, Bld: 93 mg/dL (ref 65–99)
POTASSIUM: 4.6 mmol/L (ref 3.5–5.3)
SODIUM: 142 mmol/L (ref 135–146)
Total Protein: 6.9 g/dL (ref 6.1–8.1)

## 2017-08-01 LAB — VITAMIN D 25 HYDROXY (VIT D DEFICIENCY, FRACTURES): Vit D, 25-Hydroxy: 28 ng/mL — ABNORMAL LOW (ref 30–100)

## 2017-08-01 LAB — CBC
HEMATOCRIT: 38.4 % (ref 35.0–45.0)
HEMOGLOBIN: 12.6 g/dL (ref 11.7–15.5)
MCH: 27.1 pg (ref 27.0–33.0)
MCHC: 32.8 g/dL (ref 32.0–36.0)
MCV: 82.6 fL (ref 80.0–100.0)
MPV: 10.4 fL (ref 7.5–12.5)
Platelets: 278 10*3/uL (ref 140–400)
RBC: 4.65 10*6/uL (ref 3.80–5.10)
RDW: 13.5 % (ref 11.0–15.0)
WBC: 6.3 10*3/uL (ref 3.8–10.8)

## 2017-08-01 LAB — LIPID PANEL
CHOLESTEROL: 194 mg/dL (ref <200)
HDL: 64 mg/dL (ref >50)
LDL Cholesterol (Calc): 116 mg/dL (calc) — ABNORMAL HIGH
Non-HDL Cholesterol (Calc): 130 mg/dL (calc) — ABNORMAL HIGH (ref <130)
Total CHOL/HDL Ratio: 3 (calc) (ref <5.0)
Triglycerides: 53 mg/dL (ref <150)

## 2017-08-01 LAB — TSH: TSH: 4.41 mIU/L

## 2018-03-23 ENCOUNTER — Ambulatory Visit
Admit: 2018-03-23 | Discharge: 2018-03-23 | Payer: PRIVATE HEALTH INSURANCE | Attending: Registered Nurse | Primary: Registered Nurse

## 2018-03-23 DIAGNOSIS — Z7689 Persons encountering health services in other specified circumstances: Secondary | ICD-10-CM

## 2018-03-23 NOTE — Progress Notes (Signed)
03/23/2018    Jennifer Mitchell (DOB:  August 08, 1985) is a 33 y.o. female, here for evaluation of the following medical concerns:    HPI   Patient presents today to establish care. She has a chronic history of hypothyroidism, irritable bowel syndrome and history of gallstones.     Patient moved here in her past year from [place].     Hypothyroid-On 50 mcg daily. She states just had blood work done yesterday. Her OBGYN did the blood work. Will obtain records. Also has a history of uterine fibroids.       Irritable bowel with diarrhea/constipation. She reports having had a upper and lower scope in the past. She would like to establish with a new GI specialist.     She was told over a year ago in [place] that she had gallstones. She states she was told they are small and not to worry about them. She does get right upper quadrant discomfort and it is worse after she eats.     Patient's past medical history, surgical history, family history, medications,  and allergies  were all reviewed and updated as appropriate today.    Review of Systems   Constitutional: Positive for fatigue.   HENT: Negative.    Respiratory: Negative.    Cardiovascular: Negative.    Gastrointestinal: Negative.    Genitourinary: Negative.    Musculoskeletal: Negative.    Skin: Negative.    Neurological: Negative for dizziness and headaches.   Psychiatric/Behavioral: Negative for self-injury, sleep disturbance and suicidal ideas. The patient is not nervous/anxious.        Prior to Visit Medications    Medication Sig Taking? Authorizing Provider   levothyroxine (SYNTHROID) 50 MCG tablet TAKE 1 TABLET BY MOUTH ONCE DAILY ON AN EMPTY STOMACH 30 MINUTES BEFORE BREAKFAST FOR 90 DAYS Yes Historical Provider, MD        Allergies   Allergen Reactions   ??? Clindamycin/Lincomycin        Past Medical History:   Diagnosis Date   ??? Hypothyroidism    ??? IBS (irritable bowel syndrome)        Past Surgical History:   Procedure Laterality Date   ??? CYST  REMOVAL         Social History     Socioeconomic History   ??? Marital status: Married     Spouse name: Not on file   ??? Number of children: Not on file   ??? Years of education: Not on file   ??? Highest education level: Not on file   Occupational History   ??? Not on file   Social Needs   ??? Financial resource strain: Not on file   ??? Food insecurity:     Worry: Not on file     Inability: Not on file   ??? Transportation needs:     Medical: Not on file     Non-medical: Not on file   Tobacco Use   ??? Smoking status: Never Smoker   ??? Smokeless tobacco: Never Used   Substance and Sexual Activity   ??? Alcohol use: Yes     Frequency: Monthly or less     Comment: socially   ??? Drug use: Never   ??? Sexual activity: Yes     Birth control/protection: Condom   Lifestyle   ??? Physical activity:     Days per week: Not on file     Minutes per session: Not on file   ??? Stress: Not on file  Relationships   ??? Social connections:     Talks on phone: Not on file     Gets together: Not on file     Attends religious service: Not on file     Active member of club or organization: Not on file     Attends meetings of clubs or organizations: Not on file     Relationship status: Not on file   ??? Intimate partner violence:     Fear of current or ex partner: Not on file     Emotionally abused: Not on file     Physically abused: Not on file     Forced sexual activity: Not on file   Other Topics Concern   ??? Not on file   Social History Narrative   ??? Not on file        Family History   Problem Relation Age of Onset   ??? Hypothyroidism Mother    ??? Other Mother         colon polyps   ??? High Cholesterol Mother    ??? Alzheimer's Disease Father    ??? Prostate Cancer Father    ??? High Cholesterol Brother    ??? High Blood Pressure Brother    ??? Ovarian Cancer Maternal Grandmother    ??? Heart Disease Paternal Grandfather        Vitals:    03/23/18 1040   BP: 106/62   Pulse: 78   SpO2: 99%   Weight: 191 lb 4.8 oz (86.8 kg)   Height: 5\' 3"  (1.6 m)     Estimated body mass index  is 33.89 kg/m?? as calculated from the following:    Height as of this encounter: 5\' 3"  (1.6 m).    Weight as of this encounter: 191 lb 4.8 oz (86.8 kg).    Physical Exam  Vitals signs reviewed.   Cardiovascular:      Rate and Rhythm: Normal rate and regular rhythm.      Heart sounds: Normal heart sounds.   Pulmonary:      Effort: Pulmonary effort is normal.      Breath sounds: Normal breath sounds.   Abdominal:      General: Bowel sounds are normal.      Tenderness: There is abdominal tenderness (right upper quadrant).   Skin:     General: Skin is warm and dry.   Neurological:      Mental Status: She is alert and oriented to person, place, and time.   Psychiatric:         Mood and Affect: Mood normal.         Behavior: Behavior normal.         ASSESSMENT/PLAN:  1. Encounter to establish care  See below    2. Hypothyroidism, unspecified type  Will take over her thyroid care. She will get the records from her OBGYN sent over.     3. Irritable bowel syndrome with both constipation and diarrhea  Controlled with diet right now. Will send referral to GI.   - [facility] - Wende Mott , MD, Gastroenterology, [place]    4. Right upper quadrant pain  Will obtain ultrasound. If it confirms gallstones will refer to general surgery.   - US GALLBLADDER RUQ; Future    5. History of gallstones  - US GALLBLADDER RUQ; Future          An  electronic signature was used to authenticate this note.    --Mellody Life, APRN -  CNP on 03/23/2018 at 1:06 PM

## 2019-06-10 NOTE — Telephone Encounter (Signed)
Received call from Katie  at [place] pre-service center [facility])  with Red Flag Complaint.    Brief description of triage: numbness to the right foot. Varicose veins present.     Triage indicates for patient to be seen today. UCC or the ED if care needed emergently.     Care advice provided, patient verbalizes understanding; denies any other questions or concerns; instructed to call back for any new or worsening symptoms.    Writer provided warm transfer to [place]  at  [facility] for appointment scheduling.    Attention Provider:  Thank you for allowing me to participate in the care of your patient.  The patient was connected to triage in response to information provided to the ECC.  Please do not respond through this encounter as the response is not directed to a shared pool.      Reason for Disposition  ??? Patient wants to be seen    Answer Assessment - Initial Assessment Questions  1. SYMPTOM: "What is the main symptom you are concerned about?" (e.g., weakness, numbness)      Numbness to the left leg and left foot. Happens when she walks long distances in the past but now 1/2 of her foot has been numb for one day. The outside right foot.     2. ONSET: "When did this start?" (minutes, hours, days; while sleeping)      A couple of months.     3. LAST NORMAL: "When was the last time you were normal (no symptoms)?"     A couple of months    4. PATTERN "Does this come and go, or has it been constant since it started?"  "Is it present now?"        When she is lying down the feeling does come back to her feet, standing up the feeling comes back.   When the right leg is bent she has the numbness    5. CARDIAC SYMPTOMS: "Have you had any of the following symptoms: chest pain, difficulty breathing, palpitations?"      Denies      6. NEUROLOGIC SYMPTOMS: "Have you had any of the following symptoms: headache, dizziness, vision loss, double vision, changes in speech, unsteady on your feet?"      Denies      7. OTHER  SYMPTOMS: "Do you have any other symptoms?"      denies    8. PREGNANCY: "Is there any chance you are pregnant?" "When was your last menstrual period?"      denies    Protocols used: NEUROLOGIC DEFICIT-ADULT-[place]    See above documentation

## 2019-11-15 NOTE — Telephone Encounter (Signed)
She needs to rest and take ibuprofen and increase fluid intake.  She can take up to 800 mg of ibuprofen 3 times a day but she should take this with food to help decrease upset stomach.  She can also use Tylenol in between those doses.

## 2019-11-15 NOTE — Telephone Encounter (Signed)
Pt informed

## 2019-11-15 NOTE — Telephone Encounter (Signed)
Call from patient stating she got her second covid vaccine on Tuesday.  She had bad aches, felt like she was hit by a truck and missed work also. Today her arm is super sore and has three sites on arm that are red, inflamed and "fevered."    Patient stated she has been taking ibuprofen but not around the clock.  Last dose was 4:00 pm yesterday.  Took Tylenol at first and did not help.      Please advise what patient can do for symptoms.

## 2019-11-15 NOTE — Telephone Encounter (Signed)
Please advise

## 2019-11-21 ENCOUNTER — Telehealth

## 2019-11-21 NOTE — Telephone Encounter (Signed)
Lab orders were placed.  It is fine for her to bring her child and she can get the flu shot when she is here.

## 2019-11-21 NOTE — Telephone Encounter (Signed)
Pt notified.

## 2019-11-21 NOTE — Telephone Encounter (Signed)
-----   Message from Payton Emerald sent at 11/21/2019  1:10 PM EDT -----  Subject: Message to Provider    QUESTIONS  Information for Provider? Pt would like to have labs done prior to appt,   please put orders in prior to appt on 11/11. Pt will also have to bring   child with her to appt, would like to know if that was an issue. Lastly pt   would like to get fer flu shot during appt on 11/11   ---------------------------------------------------------------------------  --------------  CALL BACK INFO  What is the best way for the office to contact you? OK to leave message on   voicemail  Preferred Call Back Phone Number? [phone]  ---------------------------------------------------------------------------  --------------  SCRIPT ANSWERS  Relationship to Patient? Self

## 2019-11-27 DIAGNOSIS — M9901 Segmental and somatic dysfunction of cervical region: Secondary | ICD-10-CM | POA: Diagnosis not present

## 2019-11-27 DIAGNOSIS — M9902 Segmental and somatic dysfunction of thoracic region: Secondary | ICD-10-CM | POA: Diagnosis not present

## 2019-11-27 DIAGNOSIS — M9905 Segmental and somatic dysfunction of pelvic region: Secondary | ICD-10-CM | POA: Diagnosis not present

## 2019-11-27 DIAGNOSIS — M9903 Segmental and somatic dysfunction of lumbar region: Secondary | ICD-10-CM | POA: Diagnosis not present

## 2019-11-28 ENCOUNTER — Ambulatory Visit
Admit: 2019-11-28 | Discharge: 2019-11-28 | Payer: PRIVATE HEALTH INSURANCE | Attending: Registered Nurse | Primary: Registered Nurse

## 2019-11-28 ENCOUNTER — Inpatient Hospital Stay: Payer: PRIVATE HEALTH INSURANCE | Primary: Registered Nurse

## 2019-11-28 DIAGNOSIS — Z0000 Encounter for general adult medical examination without abnormal findings: Secondary | ICD-10-CM

## 2019-11-28 DIAGNOSIS — M9902 Segmental and somatic dysfunction of thoracic region: Secondary | ICD-10-CM | POA: Diagnosis not present

## 2019-11-28 DIAGNOSIS — M9903 Segmental and somatic dysfunction of lumbar region: Secondary | ICD-10-CM | POA: Diagnosis not present

## 2019-11-28 DIAGNOSIS — Z23 Encounter for immunization: Secondary | ICD-10-CM | POA: Diagnosis not present

## 2019-11-28 DIAGNOSIS — M9905 Segmental and somatic dysfunction of pelvic region: Secondary | ICD-10-CM | POA: Diagnosis not present

## 2019-11-28 LAB — CBC WITH AUTO DIFFERENTIAL
Basophils %: 0.6 %
Basophils Absolute: 0 10*3/uL (ref 0.0–0.2)
Eosinophils %: 1.5 %
Eosinophils Absolute: 0.1 10*3/uL (ref 0.0–0.6)
Hematocrit: 34.5 % — ABNORMAL LOW (ref 36.0–48.0)
Hemoglobin: 11 g/dL — ABNORMAL LOW (ref 12.0–16.0)
Lymphocytes %: 26.5 %
Lymphocytes Absolute: 1.7 10*3/uL (ref 1.0–5.1)
MCH: 23.9 pg — ABNORMAL LOW (ref 26.0–34.0)
MCHC: 31.9 g/dL (ref 31.0–36.0)
MCV: 74.9 fL — ABNORMAL LOW (ref 80.0–100.0)
MPV: 8.6 fL (ref 5.0–10.5)
Monocytes %: 7.6 %
Monocytes Absolute: 0.5 10*3/uL (ref 0.0–1.3)
Neutrophils %: 63.8 %
Neutrophils Absolute: 4.2 10*3/uL (ref 1.7–7.7)
Platelets: 281 10*3/uL (ref 135–450)
RBC: 4.61 M/uL (ref 4.00–5.20)
RDW: 16 % — ABNORMAL HIGH (ref 12.4–15.4)
WBC: 6.6 10*3/uL (ref 4.0–11.0)

## 2019-11-28 LAB — COMPREHENSIVE METABOLIC PANEL
ALT: 12 U/L (ref 10–40)
AST: 12 U/L — ABNORMAL LOW (ref 15–37)
Albumin/Globulin Ratio: 1.6 (ref 1.1–2.2)
Albumin: 4.3 g/dL (ref 3.4–5.0)
Alkaline Phosphatase: 73 U/L (ref 40–129)
Anion Gap: 13 (ref 3–16)
BUN: 13 mg/dL (ref 7–20)
CO2: 25 mmol/L (ref 21–32)
Calcium: 9.5 mg/dL (ref 8.3–10.6)
Chloride: 104 mmol/L (ref 99–110)
Creatinine: 0.7 mg/dL (ref 0.6–1.1)
GFR African American: >60 (ref >60)
GFR Non-African American: >60 (ref >60)
Glucose: 96 mg/dL (ref 70–99)
Potassium: 4.9 mmol/L (ref 3.5–5.1)
Sodium: 142 mmol/L (ref 136–145)
Total Bilirubin: <0.2 mg/dL (ref 0.0–1.0)
Total Protein: 7 g/dL (ref 6.4–8.2)

## 2019-11-28 LAB — LIPID PANEL
Cholesterol, Total: 208 mg/dL — ABNORMAL HIGH (ref 0–199)
HDL: 48 mg/dL (ref 40–60)
LDL Calculated: 139 mg/dL — ABNORMAL HIGH (ref <100)
Triglycerides: 106 mg/dL (ref 0–150)
VLDL Cholesterol Calculated: 21 mg/dL

## 2019-11-28 LAB — TSH WITH REFLEX TO FT4: TSH Reflex FT4: 3.2 u[IU]/mL (ref 0.27–4.20)

## 2019-11-28 NOTE — Progress Notes (Signed)
Well Adult Note  Name: Jennifer Mitchell Today???s Date: 11/28/2019   MRN: <[id number]> Sex: Female   Age: 34 y.o. Ethnicity: Non-Hispanic / Non Latino   DOB: 11-Jul-1985 Race: White (non-Hispanic)      Jennifer Mitchell is here for well adult exam.  History:    Patient presents today for a wellness exam.  She does have concerns about her heart due to family history and her weight.  She does work a sedentary job but states she tries to exercise as well.  She is going to be starting optavia to help with weight loss.        Review of Systems   Constitutional: Negative.    HENT: Negative.    Eyes: Negative.    Respiratory: Negative.    Cardiovascular: Negative.    Gastrointestinal: Negative.    Genitourinary: Negative.    Musculoskeletal: Negative.    Skin: Negative.    Neurological: Negative.  Negative for dizziness and headaches.   Psychiatric/Behavioral: Negative.        Allergies   Allergen Reactions   ??? Latex Rash   ??? Soy Allergy      Causes diarrhea   ??? Clindamycin/Lincomycin    ??? Lactase          Prior to Visit Medications    Medication Sig Taking? Authorizing Provider   GARLIC PO Take 4 capsules by mouth daily Yes Historical Provider, MD   loratadine (CLARITIN) 10 MG capsule Take 1 tablet by mouth daily Yes Historical Provider, MD   levothyroxine (SYNTHROID) 50 MCG tablet TAKE 1 TABLET BY MOUTH ONCE DAILY ON AN EMPTY STOMACH 30 MINUTES BEFORE BREAKFAST FOR 90 DAYS Yes Historical Provider, MD         Past Medical History:   Diagnosis Date   ??? Hypothyroidism    ??? IBS (irritable bowel syndrome)        Past Surgical History:   Procedure Laterality Date   ??? CYST REMOVAL           Family History   Problem Relation Age of Onset   ??? Hypothyroidism Mother    ??? Other Mother         colon polyps   ??? High Cholesterol Mother    ??? High Blood Pressure Father    ??? Alzheimer's Disease Father    ??? Prostate Cancer Father    ??? High Cholesterol Brother    ??? High Blood Pressure Brother    ??? Ovarian Cancer Maternal Grandmother    ??? Heart  Disease Paternal Grandfather        Social History     Tobacco Use   ??? Smoking status: Never Smoker   ??? Smokeless tobacco: Never Used   Substance Use Topics   ??? Alcohol use: Yes     Comment: socially   ??? Drug use: Never       Objective   BP 112/60    Pulse 75    Ht 5' 3.19" (1.605 m)    Wt 220 lb 6.4 oz (100 kg)    SpO2 99%    BMI 38.81 kg/m??   Wt Readings from Last 3 Encounters:   11/28/19 220 lb 6.4 oz (100 kg)   03/23/18 191 lb 4.8 oz (86.8 kg)       Physical Exam  Vitals reviewed.   Cardiovascular:      Rate and Rhythm: Normal rate and regular rhythm.      Heart sounds: Normal heart sounds.   Pulmonary:  Effort: Pulmonary effort is normal.      Breath sounds: Normal breath sounds.   Abdominal:      General: Bowel sounds are normal.      Tenderness: There is no abdominal tenderness.   Skin:     General: Skin is warm and dry.   Neurological:      Mental Status: She is alert and oriented to person, place, and time.   Psychiatric:         Mood and Affect: Mood normal.         Behavior: Behavior normal.           Assessment   Plan   1. Encounter for well adult exam without abnormal findings  Continue with diet and exercise.   2. Need for vaccination  -     INFLUENZA, MDCK QUADV, 2 YRS AND OLDER, IM, PF, PREFILL SYR OR SDV, 0.5ML (FLUCELVAX QUADV, PF)         Personalized Preventive Plan   Current Health Maintenance Status  Immunization History   Administered Date(s) Administered   ??? COVID-19, Pfizer, PF, 97mcg/0.3mL 09/17/2019, 11/12/2019   ??? Influenza, MDCK Quadv, IM, PF (Flucelvax 2 yrs and older) 11/28/2019        Health Maintenance   Topic Date Due   ??? TSH testing  Never done   ??? Hepatitis C screen  Never done   ??? Varicella vaccine (1 of 2 - 2-dose childhood series) Never done   ??? HIV screen  Never done   ??? Cervical cancer screen  Never done   ??? DTaP/Tdap/Td vaccine (10 - Td or Tdap) 10/14/2026   ??? Hib vaccine  Completed   ??? Flu vaccine  Completed   ??? COVID-19 Vaccine  Completed   ??? Hepatitis A vaccine  Aged  Out   ??? Hepatitis B vaccine  Aged Out   ??? Meningococcal (ACWY) vaccine  Aged Out   ??? Pneumococcal 0-64 years Vaccine  Aged Out     Recommendations for [employer] Due: see orders and patient instructions/AVS.  .

## 2019-12-02 DIAGNOSIS — M9905 Segmental and somatic dysfunction of pelvic region: Secondary | ICD-10-CM | POA: Diagnosis not present

## 2019-12-02 DIAGNOSIS — M9902 Segmental and somatic dysfunction of thoracic region: Secondary | ICD-10-CM | POA: Diagnosis not present

## 2019-12-02 DIAGNOSIS — M9903 Segmental and somatic dysfunction of lumbar region: Secondary | ICD-10-CM | POA: Diagnosis not present

## 2019-12-04 DIAGNOSIS — M9903 Segmental and somatic dysfunction of lumbar region: Secondary | ICD-10-CM | POA: Diagnosis not present

## 2019-12-04 DIAGNOSIS — M9902 Segmental and somatic dysfunction of thoracic region: Secondary | ICD-10-CM | POA: Diagnosis not present

## 2019-12-04 DIAGNOSIS — M9905 Segmental and somatic dysfunction of pelvic region: Secondary | ICD-10-CM | POA: Diagnosis not present

## 2019-12-09 DIAGNOSIS — M9903 Segmental and somatic dysfunction of lumbar region: Secondary | ICD-10-CM | POA: Diagnosis not present

## 2019-12-09 DIAGNOSIS — M9905 Segmental and somatic dysfunction of pelvic region: Secondary | ICD-10-CM | POA: Diagnosis not present

## 2019-12-09 DIAGNOSIS — M9902 Segmental and somatic dysfunction of thoracic region: Secondary | ICD-10-CM | POA: Diagnosis not present

## 2019-12-18 DIAGNOSIS — N6459 Other signs and symptoms in breast: Secondary | ICD-10-CM | POA: Diagnosis not present

## 2019-12-18 DIAGNOSIS — Z01419 Encounter for gynecological examination (general) (routine) without abnormal findings: Secondary | ICD-10-CM | POA: Diagnosis not present

## 2019-12-18 DIAGNOSIS — R928 Other abnormal and inconclusive findings on diagnostic imaging of breast: Secondary | ICD-10-CM | POA: Diagnosis not present

## 2020-03-17 ENCOUNTER — Telehealth
Admit: 2020-03-17 | Discharge: 2020-03-17 | Payer: PRIVATE HEALTH INSURANCE | Attending: Registered Nurse | Primary: Registered Nurse

## 2020-03-17 DIAGNOSIS — J019 Acute sinusitis, unspecified: Secondary | ICD-10-CM

## 2020-03-17 DIAGNOSIS — B9689 Other specified bacterial agents as the cause of diseases classified elsewhere: Secondary | ICD-10-CM | POA: Diagnosis not present

## 2020-03-17 MED ORDER — AMOXICILLIN-POT CLAVULANATE 875-125 MG PO TABS
875-125 MG | ORAL_TABLET | Freq: Two times a day (BID) | ORAL | 0 refills | Status: AC
Start: 2020-03-17 — End: 2020-03-27

## 2020-03-17 MED ORDER — METHYLPREDNISOLONE 4 MG PO TBPK
4 MG | ORAL_TABLET | ORAL | 0 refills | Status: DC
Start: 2020-03-17 — End: 2021-01-25

## 2020-03-17 MED ORDER — ONDANSETRON HCL 4 MG PO TABS
4 MG | ORAL_TABLET | Freq: Three times a day (TID) | ORAL | 0 refills | Status: AC | PRN
Start: 2020-03-17 — End: 2021-06-22

## 2020-03-17 NOTE — Progress Notes (Signed)
03/17/2020    TELEHEALTH EVALUATION -- Audio/Visual (During COVID-19 public health emergency)    HPI:    Jennifer Mitchell (DOB:  Aug 01, 1985) has requested an audio/video evaluation for the following concern(s):    Patient presents today with a week's worth of symptoms including a productive cough, head congestion and pressure.  Patient reports she feels like her head is severe pressure.  She tried 600 mg ibuprofen without improvement she has also tried Tylenol and Mucinex.  She also reports nausea.  Her Covid test was negative.        Review of Systems   Constitutional: Positive for fatigue. Negative for fever.   HENT: Positive for congestion, sinus pressure and sinus pain.    Respiratory: Positive for cough. Negative for shortness of breath and wheezing.    Gastrointestinal: Negative.    Musculoskeletal: Negative.    Skin: Negative.    Neurological: Positive for headaches. Negative for dizziness.       Prior to Visit Medications    Medication Sig Taking? Authorizing Provider   amoxicillin-clavulanate (AUGMENTIN) 875-125 MG per tablet Take 1 tablet by mouth 2 times daily for 10 days Yes Mellody Life, APRN - CNP   ondansetron (ZOFRAN) 4 MG tablet Take 1 tablet by mouth 3 times daily as needed for Nausea or Vomiting Yes Shanley Furlough, APRN - CNP   methylPREDNISolone (MEDROL, PAK,) 4 MG tablet Take 6 tablets all at once on day 1, 5 tablets on day 2, 4 tablets on day 3, 3 tablets on day 4 and 2 tablets on day 5 and 1 tablet on day 6. Yes Mellody Life, APRN - CNP   loratadine (CLARITIN) 10 MG capsule Take 1 tablet by mouth daily Yes Historical Provider, MD   levothyroxine (SYNTHROID) 50 MCG tablet TAKE 1 TABLET BY MOUTH ONCE DAILY ON AN EMPTY STOMACH 30 MINUTES BEFORE BREAKFAST FOR 90 DAYS Yes Historical Provider, MD   GARLIC PO Take 4 capsules by mouth daily  Patient not taking: Reported on 03/17/2020  Historical Provider, MD       Social History     Tobacco Use   ??? Smoking status: Never Smoker   ??? Smokeless tobacco:  Never Used   Vaping Use   ??? Vaping Use: Never used   Substance Use Topics   ??? Alcohol use: Yes     Comment: socially   ??? Drug use: Never            PHYSICAL EXAMINATION:  [ INSTRUCTIONS:  "[x] " Indicates a positive item  "[] " Indicates a negative item  -- DELETE ALL ITEMS NOT EXAMINED]  Vital Signs: (As obtained by patient/caregiver or practitioner observation)    Blood pressure-  Heart rate-    Respiratory rate-    Temperature-  Pulse oximetry-     Constitutional: [x]  Appears well-developed and well-nourished [x]  No apparent distress      []  Abnormal-   Mental status  [x]  Alert and awake  [x]  Oriented to person/place/time [] Able to follow commands      Eyes:  EOM    []   Normal  []  Abnormal-  Sclera  []   Normal  []  Abnormal -         Discharge []   None visible  []  Abnormal -    HENT:   [x]  Normocephalic, atraumatic.  []  Abnormal   [x]  Mouth/Throat: Mucous membranes are moist.     External Ears []  Normal  []  Abnormal-     Neck: []  No visualized mass  Pulmonary/Chest: [x]  Respiratory effort normal.  [x]  No visualized signs of difficulty breathing or respiratory distress        []  Abnormal-      Musculoskeletal:   []  Normal gait with no signs of ataxia         [x]  Normal range of motion of neck        []  Abnormal-       Neurological:        []  No Facial Asymmetry (Cranial nerve 7 motor function) (limited exam to video visit)          []  No gaze palsy        []  Abnormal-         Skin:        [x]  No significant exanthematous lesions or discoloration noted on facial skin         []  Abnormal-            Psychiatric:       []  Normal Affect []  No Hallucinations        []  Abnormal-     Other pertinent observable physical exam findings-     ASSESSMENT/PLAN:  1. Acute bacterial sinusitis  We will treat with medications listed below and follow-up if no improvement.  - amoxicillin-clavulanate (AUGMENTIN) 875-125 MG per tablet; Take 1 tablet by mouth 2 times daily for 10 days  Dispense: 20 tablet; Refill: 0  - ondansetron  (ZOFRAN) 4 MG tablet; Take 1 tablet by mouth 3 times daily as needed for Nausea or Vomiting  Dispense: 30 tablet; Refill: 0  - methylPREDNISolone (MEDROL, PAK,) 4 MG tablet; Take 6 tablets all at once on day 1, 5 tablets on day 2, 4 tablets on day 3, 3 tablets on day 4 and 2 tablets on day 5 and 1 tablet on day 6.  Dispense: 21 tablet; Refill: 0          Jennifer Mitchell is a 35 y.o. female being evaluated by a Virtual Visit (video visit) encounter to address concerns as mentioned above.  A caregiver was present when appropriate. Due to this being a (During COVID-19 public health emergency), evaluation of the following organ systems was limited: Vitals/Constitutional/EENT/Resp/CV/GI/GU/MS/Neuro/Skin/Heme-Lymph-Imm.  Pursuant to the emergency declaration under the [facility] Act and the , 1135 waiver authority and the and Act, this Virtual Visit was conducted with patient's (and/or legal guardian's) consent, to reduce the patient's risk of exposure to COVID-19 and provide necessary medical care.  The patient (and/or legal guardian) has also been advised to contact this office for worsening conditions or problems, and seek emergency medical treatment and/or call 911 if deemed necessary.     Services were provided through a video synchronous discussion virtually to substitute for in-person clinic visit. Patient and provider were located at their individual homes.    -- , APRN - CNP on 03/17/2020 at 12:58 PM    An electronic signature was used to authenticate this note.

## 2020-04-09 NOTE — Telephone Encounter (Addendum)
Pt requesting refill that her OBGYN used to fill for her.  Pt states since she is not pregnant anymore her OB advise that Pt's PCP should take over refilling medication.  levothyroxine (SYNTHROID) 50 MCG tablet     Send To:    [facility] [address] [place] - [address] - P [phone] - F [phone]

## 2020-04-09 NOTE — Telephone Encounter (Signed)
Please advise?

## 2020-04-10 MED ORDER — LEVOTHYROXINE SODIUM 50 MCG PO TABS
50 MCG | ORAL_TABLET | ORAL | 3 refills | Status: DC
Start: 2020-04-10 — End: 2020-04-10

## 2020-04-10 MED ORDER — LEVOTHYROXINE SODIUM 50 MCG PO TABS
50 MCG | ORAL_TABLET | ORAL | 1 refills | Status: DC
Start: 2020-04-10 — End: 2021-01-26

## 2020-04-10 NOTE — Telephone Encounter (Addendum)
Pt informed and would like this to be a 90 day supply.

## 2020-04-10 NOTE — Telephone Encounter (Signed)
Refill sent

## 2020-04-20 DIAGNOSIS — M9903 Segmental and somatic dysfunction of lumbar region: Secondary | ICD-10-CM | POA: Diagnosis not present

## 2020-04-20 DIAGNOSIS — M9905 Segmental and somatic dysfunction of pelvic region: Secondary | ICD-10-CM | POA: Diagnosis not present

## 2020-04-20 DIAGNOSIS — M9902 Segmental and somatic dysfunction of thoracic region: Secondary | ICD-10-CM | POA: Diagnosis not present

## 2020-04-23 DIAGNOSIS — M9903 Segmental and somatic dysfunction of lumbar region: Secondary | ICD-10-CM | POA: Diagnosis not present

## 2020-04-23 DIAGNOSIS — M9905 Segmental and somatic dysfunction of pelvic region: Secondary | ICD-10-CM | POA: Diagnosis not present

## 2020-04-23 DIAGNOSIS — M9902 Segmental and somatic dysfunction of thoracic region: Secondary | ICD-10-CM | POA: Diagnosis not present

## 2021-01-18 NOTE — Telephone Encounter (Signed)
Please call patient to schedule an appointment. She is due for wellness and thyroid check.

## 2021-01-19 MED ORDER — LEVOTHYROXINE SODIUM 88 MCG PO TABS
88 MCG | ORAL_TABLET | ORAL | 0 refills | Status: DC
Start: 2021-01-19 — End: 2021-01-26

## 2021-01-19 NOTE — Telephone Encounter (Signed)
Appt scheduled for 01/25/21

## 2021-01-25 ENCOUNTER — Encounter

## 2021-01-25 ENCOUNTER — Ambulatory Visit: Admit: 2021-01-25 | Payer: PRIVATE HEALTH INSURANCE | Attending: Registered Nurse | Primary: Registered Nurse

## 2021-01-25 DIAGNOSIS — Z0000 Encounter for general adult medical examination without abnormal findings: Secondary | ICD-10-CM

## 2021-01-25 DIAGNOSIS — L299 Pruritus, unspecified: Secondary | ICD-10-CM | POA: Diagnosis not present

## 2021-01-25 DIAGNOSIS — E039 Hypothyroidism, unspecified: Secondary | ICD-10-CM | POA: Diagnosis not present

## 2021-01-25 LAB — COMPREHENSIVE METABOLIC PANEL
ALT: 11 U/L (ref 10–40)
AST: 13 U/L — ABNORMAL LOW (ref 15–37)
Albumin/Globulin Ratio: 1.8 (ref 1.1–2.2)
Albumin: 4.2 g/dL (ref 3.4–5.0)
Alkaline Phosphatase: 80 U/L (ref 40–129)
Anion Gap: 12 (ref 3–16)
BUN: 9 mg/dL (ref 7–20)
CO2: 24 mmol/L (ref 21–32)
Calcium: 9.7 mg/dL (ref 8.3–10.6)
Chloride: 103 mmol/L (ref 99–110)
Creatinine: 0.6 mg/dL (ref 0.6–1.1)
Est, Glom Filt Rate: >60 (ref >60)
Glucose: 99 mg/dL (ref 70–99)
Potassium: 4.6 mmol/L (ref 3.5–5.1)
Sodium: 139 mmol/L (ref 136–145)
Total Bilirubin: <0.2 mg/dL (ref 0.0–1.0)
Total Protein: 6.6 g/dL (ref 6.4–8.2)

## 2021-01-25 LAB — LIPID PANEL
Cholesterol, Total: 209 mg/dL — ABNORMAL HIGH (ref 0–199)
HDL: 47 mg/dL (ref 40–60)
LDL Calculated: 135 mg/dL — ABNORMAL HIGH (ref <100)
Triglycerides: 136 mg/dL (ref 0–150)
VLDL Cholesterol Calculated: 27 mg/dL

## 2021-01-25 LAB — TSH WITH REFLEX TO FT4: TSH Reflex FT4: 1.64 u[IU]/mL (ref 0.27–4.20)

## 2021-01-25 NOTE — Progress Notes (Signed)
Well Adult Note  Name: Jennifer Mitchell Today???s Date: 01/25/2021   MRN: [id number] Sex: Female   Age: 36 y.o. Ethnicity: Non-Hispanic / Non Latino   DOB: 03-21-1985 Race: White (non-Hispanic)      Jennifer Mitchell is here for well adult exam.  History:    Patient presents today for a wellness exam. She does need her thyroid checked today.   She has had itching of her skin. She states if she rubs her arms with a wash cloth or when she shaves her legs she gets intense itching for about 30 minutes afterwards. She denies any rash or welts. She denies using any different soaps, or lotions or laundry detergents. She has tried Aquafor ointment and other eczema creams with no improvement.     She has weight concerns, she states she does an optavia shake in the morning and refuels after that and then she states it goes down hill and she does snack at work. Dinner is usually healthy.           Review of Systems   Constitutional: Negative.    HENT: Negative.     Respiratory: Negative.     Cardiovascular: Negative.    Gastrointestinal: Negative.    Musculoskeletal: Negative.    Skin:         Skin itching   Neurological: Negative.  Negative for dizziness and headaches.   Psychiatric/Behavioral: Negative.       Allergies   Allergen Reactions    Latex Rash    Soy Allergy      Causes diarrhea    Clindamycin/Lincomycin     Tilactase          Prior to Visit Medications    Medication Sig Taking? Authorizing Provider   levothyroxine (SYNTHROID) 88 MCG tablet TAKE 1 TABLET BY MOUTH ONCE DAILY ON  EMPTY  STOMACH  30  MINUTES  BEFORE  BREAKFAST Yes Schon Zeiders, APRN - CNP   ondansetron (ZOFRAN) 4 MG tablet Take 1 tablet by mouth 3 times daily as needed for Nausea or Vomiting Yes Mellody Life, APRN - CNP   levothyroxine (SYNTHROID) 50 MCG tablet TAKE 1 TABLET BY MOUTH ONCE DAILY ON AN EMPTY STOMACH 30 MINUTES BEFORE BREAKFAST FOR 90 DAYS  Patient not taking: Reported on 01/25/2021  Mellody Life, APRN - CNP         Past Medical History:    Diagnosis Date    Hypothyroidism     IBS (irritable bowel syndrome)        Past Surgical History:   Procedure Laterality Date    CYST REMOVAL           Family History   Problem Relation Age of Onset    Hypothyroidism Mother     Other Mother         colon polyps    High Cholesterol Mother     High Blood Pressure Father     Alzheimer's Disease Father     Prostate Cancer Father     High Cholesterol Brother     High Blood Pressure Brother     Ovarian Cancer Maternal Grandmother     Heart Disease Paternal Grandfather        Social History     Tobacco Use    Smoking status: Never    Smokeless tobacco: Never   Vaping Use    Vaping Use: Never used   Substance Use Topics    Alcohol use: Yes     Comment: socially  Drug use: Never       Objective   BP 128/84    Pulse 75    Ht 5\' 3"  (1.6 m)    Wt 233 lb (105.7 kg)    LMP 01/09/2021    SpO2 99%    BMI 41.27 kg/m??   Wt Readings from Last 3 Encounters:   01/25/21 233 lb (105.7 kg)   11/28/19 220 lb 6.4 oz (100 kg)   03/23/18 191 lb 4.8 oz (86.8 kg)     There were no vitals filed for this visit.      Physical Exam  Vitals reviewed.   HENT:      Right Ear: Tympanic membrane, ear canal and external ear normal.      Left Ear: Tympanic membrane, ear canal and external ear normal.   Cardiovascular:      Rate and Rhythm: Normal rate and regular rhythm.      Heart sounds: Normal heart sounds.   Pulmonary:      Effort: Pulmonary effort is normal.      Breath sounds: Normal breath sounds.   Abdominal:      General: Bowel sounds are normal.   Skin:     General: Skin is warm and dry.   Neurological:      Mental Status: She is alert and oriented to person, place, and time.   Psychiatric:         Mood and Affect: Mood normal.         Behavior: Behavior normal.         Assessment   Plan   1. Encounter for well adult exam without abnormal findings  Will check fasting blood work.   -     Comprehensive Metabolic Panel; Future  -     Lipid Panel; Future  2. Skin pruritus  Referral given to  dermatology today.   -     [facility] - 05/23/18 MD, Dermatology, [facility]  3. Hypothyroidism, unspecified type  Will recheck TSH and adjust dosage as needed .  -     TSH with Reflex to FT4; Future       Personalized Preventive Plan   Current Health Maintenance Status  Immunization History   Administered Date(s) Administered    COVID-19, PFIZER PURPLE top, DILUTE for use, (age 15 y+), 24mcg/0.3mL 09/17/2019, 11/12/2019    Influenza, FLUCELVAX, (age 82 mo+), MDCK, PF, 0.55mL 11/28/2019        Health Maintenance   Topic Date Due    Varicella vaccine (1 of 2 - 2-dose childhood series) Never done    HIV screen  Never done    Hepatitis C screen  Never done    Cervical cancer screen  Never done    COVID-19 Vaccine (3 - Booster for Pfizer series) 01/07/2020    Depression Screen  01/25/2022    DTaP/Tdap/Td vaccine (10 - Td or Tdap) 10/14/2026    Hib vaccine  Completed    Flu vaccine  Completed    Hepatitis A vaccine  Aged Out    Meningococcal (ACWY) vaccine  Aged Out    Pneumococcal 0-64 years Vaccine  Aged Out     Recommendations for 10/16/2026 Due: see orders and patient instructions/AVS.

## 2021-01-26 MED ORDER — LEVOTHYROXINE SODIUM 50 MCG PO TABS
50 MCG | ORAL_TABLET | ORAL | 1 refills | Status: DC
Start: 2021-01-26 — End: 2021-07-19

## 2021-02-03 DIAGNOSIS — M9903 Segmental and somatic dysfunction of lumbar region: Secondary | ICD-10-CM | POA: Diagnosis not present

## 2021-02-03 DIAGNOSIS — M9902 Segmental and somatic dysfunction of thoracic region: Secondary | ICD-10-CM | POA: Diagnosis not present

## 2021-02-03 DIAGNOSIS — M9905 Segmental and somatic dysfunction of pelvic region: Secondary | ICD-10-CM | POA: Diagnosis not present

## 2021-04-19 NOTE — Telephone Encounter (Signed)
Pt states that she did finish the entire course of antibiotics. Pt states the itchiness started on day 5 of the antibiotics (Saturday) and noticed hives on Sunday morning. Pt also had pain in her shoulder blades on Saturday.

## 2021-04-19 NOTE — Telephone Encounter (Signed)
Did she complete the entire course of antibiotics? When did she notice the rash and itchiness?

## 2021-04-19 NOTE — Telephone Encounter (Signed)
Patient called in stating she did a "tele med" visit last week with her work and she tested positive for strep. Patient stated she believes she is having an allergic reaction to the antibiotic she was put on Azithromycin.     Patient stated she is having itchiness, woke up and noticed welts on her hips and continued to spread. Patient took benadryl, went away for a few hours. She has continuously been taking benadryl for every 7 hours. Patient is asking for a call    Please advise

## 2021-04-19 NOTE — Telephone Encounter (Signed)
Pt notified

## 2021-04-19 NOTE — Telephone Encounter (Signed)
Please advise

## 2021-04-19 NOTE — Telephone Encounter (Signed)
So the zithromax has a long half life. It will stay in her system for a full 10 days. She can continue with benadryl and she should add Pepcid twice a day (it helps with allergic reactions). If she develops any swelling of the tongue or throat or has any shortness of breath she needs to go to the ER.

## 2021-05-04 DIAGNOSIS — E349 Endocrine disorder, unspecified: Secondary | ICD-10-CM | POA: Diagnosis not present

## 2021-05-04 DIAGNOSIS — N951 Menopausal and female climacteric states: Secondary | ICD-10-CM | POA: Diagnosis not present

## 2021-05-04 DIAGNOSIS — Z131 Encounter for screening for diabetes mellitus: Secondary | ICD-10-CM | POA: Diagnosis not present

## 2021-05-04 DIAGNOSIS — E039 Hypothyroidism, unspecified: Secondary | ICD-10-CM | POA: Diagnosis not present

## 2021-05-04 DIAGNOSIS — Z1321 Encounter for screening for nutritional disorder: Secondary | ICD-10-CM | POA: Diagnosis not present

## 2021-05-05 ENCOUNTER — Telehealth
Admit: 2021-05-05 | Discharge: 2021-05-05 | Payer: PRIVATE HEALTH INSURANCE | Attending: Registered Nurse | Primary: Registered Nurse

## 2021-05-05 DIAGNOSIS — B9689 Other specified bacterial agents as the cause of diseases classified elsewhere: Secondary | ICD-10-CM

## 2021-05-05 DIAGNOSIS — J019 Acute sinusitis, unspecified: Secondary | ICD-10-CM | POA: Diagnosis not present

## 2021-05-05 MED ORDER — AMOXICILLIN-POT CLAVULANATE 875-125 MG PO TABS
875-125 MG | ORAL_TABLET | Freq: Two times a day (BID) | ORAL | 0 refills | Status: AC
Start: 2021-05-05 — End: 2021-05-15

## 2021-05-05 MED ORDER — BENZONATATE 100 MG PO CAPS
100 MG | ORAL_CAPSULE | Freq: Three times a day (TID) | ORAL | 0 refills | Status: AC | PRN
Start: 2021-05-05 — End: 2021-05-12

## 2021-05-05 NOTE — Progress Notes (Signed)
05/05/2021    TELEHEALTH EVALUATION -- Audio/Visual (During COVID-19 public health emergency)    HPI:    Jennifer Mitchell (DOB:  1985/01/30) has requested an audio/video evaluation for the following concern(s):    Patient presents today with ongoing productive cough for over 6 weeks.  She states it feels like the congestion travels from her sinuses to her chest and in her throat.  Currently she feels it in her nasal cavity and in her throat.  She had strep throat at the end of March and took a full course of a Z-Pak and then ended up with an allergic reaction after finishing it.  She states she broke out in hives after her last dose.  She has been taking Zyrtec since April 2.  She denies any fevers but states she just cannot get rid of all the drainage and congestion.      Review of Systems   Constitutional:  Positive for fatigue. Negative for fever.   HENT:  Positive for congestion and sinus pressure.    Respiratory:  Positive for cough. Negative for shortness of breath and wheezing.    Cardiovascular: Negative.    Gastrointestinal: Negative.    Musculoskeletal: Negative.    Skin: Negative.    Neurological: Negative.      Prior to Visit Medications    Medication Sig Taking? Authorizing Provider   amoxicillin-clavulanate (AUGMENTIN) 875-125 MG per tablet Take 1 tablet by mouth 2 times daily for 10 days Yes Mellody Life, APRN - CNP   benzonatate (TESSALON) 100 MG capsule Take 1-2 capsules by mouth 3 times daily as needed for Cough Yes Mellody Life, APRN - CNP   levothyroxine (SYNTHROID) 50 MCG tablet TAKE 1 TABLET BY MOUTH ONCE DAILY ON AN EMPTY STOMACH 30 MINUTES BEFORE BREAKFAST FOR 90 DAYS Yes Shacoria Latif, APRN - CNP   ondansetron (ZOFRAN) 4 MG tablet Take 1 tablet by mouth 3 times daily as needed for Nausea or Vomiting Yes Mellody Life, APRN - CNP       Social History     Tobacco Use    Smoking status: Never    Smokeless tobacco: Never   Vaping Use    Vaping Use: Never used   Substance Use Topics     Alcohol use: Yes     Comment: socially    Drug use: Never            PHYSICAL EXAMINATION:  [ INSTRUCTIONS:  "[x] " Indicates a positive item  "[] " Indicates a negative item  -- DELETE ALL ITEMS NOT EXAMINED]  Vital Signs: (As obtained by patient/caregiver or practitioner observation)    Blood pressure-  Heart rate-    Respiratory rate-    Temperature-  Pulse oximetry-     Constitutional: [x]  Appears well-developed and well-nourished [x]  No apparent distress      []  Abnormal-   Mental status  [x]  Alert and awake  [x]  Oriented to person/place/time [] Able to follow commands      Eyes:  EOM    []   Normal  []  Abnormal-  Sclera  []   Normal  []  Abnormal -         Discharge []   None visible  []  Abnormal -    HENT:   [x]  Normocephalic, atraumatic.  []  Abnormal   [x]  Mouth/Throat: Mucous membranes are moist.     External Ears []  Normal  []  Abnormal-     Neck: []  No visualized mass     Pulmonary/Chest: [x]  Respiratory effort normal.  []   No visualized signs of difficulty breathing or respiratory distress        []  Abnormal-      Musculoskeletal:   [x]  Normal gait with no signs of ataxia         [x]  Normal range of motion of neck        []  Abnormal-       Neurological:        []  No Facial Asymmetry (Cranial nerve 7 motor function) (limited exam to video visit)          []  No gaze palsy        []  Abnormal-         Skin:        [x]  No significant exanthematous lesions or discoloration noted on facial skin         []  Abnormal-            Psychiatric:       []  Normal Affect []  No Hallucinations        []  Abnormal-     Other pertinent observable physical exam findings-     ASSESSMENT/PLAN:  1. Acute bacterial sinusitis  Treat with Augmentin twice a day for 10 days.  Tessalon Perles were sent in for cough.  Patient was advised to continue with regular Mucinex for the next 5 days.  - amoxicillin-clavulanate (AUGMENTIN) 875-125 MG per tablet; Take 1 tablet by mouth 2 times daily for 10 days  Dispense: 20 tablet; Refill: 0  - benzonatate  (TESSALON) 100 MG capsule; Take 1-2 capsules by mouth 3 times daily as needed for Cough  Dispense: 60 capsule; Refill: 0          Jennifer Mitchell is a 36 y.o. female being evaluated by a Virtual Visit (video visit) encounter to address concerns as mentioned above.  A caregiver was present when appropriate. Due to this being a (During COVID-19 public health emergency), evaluation of the following organ systems was limited: Vitals/Constitutional/EENT/Resp/CV/GI/GU/MS/Neuro/Skin/Heme-Lymph-Imm.  Pursuant to the emergency declaration under the [facility] Act and the , 1135 waiver authority and the and Act, this Virtual Visit was conducted with patient's (and/or legal guardian's) consent, to reduce the patient's risk of exposure to COVID-19 and provide necessary medical care.  The patient (and/or legal guardian) has also been advised to contact this office for worsening conditions or problems, and seek emergency medical treatment and/or call 911 if deemed necessary.     Services were provided through a video synchronous discussion virtually to substitute for in-person clinic visit. Patient and provider were located at their individual homes.    -- , APRN - CNP on 05/05/2021 at 8:17 AM    This chart was generated using the Dragon dictation system.  I created this record but it may contain dictation errors due to the limitation of the software.    An electronic signature was used to authenticate this note.

## 2021-05-17 NOTE — Telephone Encounter (Signed)
Ok for appt?

## 2021-05-17 NOTE — Telephone Encounter (Signed)
From: [facility]  To: Mellody Life  Sent: 05/17/2021 2:06 PM EDT  Subject: Weight Out of Control    I went to [facility] for weight loss and fatigue. My weight is now 247lbs and I feel It's out of control even though I am trying to eat better and walk 3 to 4 times a week 30-45 minutes each time. After doing blood work they suggested a testosterone injection, progesterone pill, Dhea supplement, D3 supplement increase and also suggested Wegovy or Phentermine however you have to sign up for a $3600 year commitment and that doesn't include the cost of prescriptions or supplements. Would you be able to see me for weight loss and possibly get me on something that would help?

## 2021-05-18 ENCOUNTER — Ambulatory Visit
Admit: 2021-05-18 | Discharge: 2021-05-18 | Payer: PRIVATE HEALTH INSURANCE | Attending: Registered Nurse | Primary: Registered Nurse

## 2021-05-18 DIAGNOSIS — Z6841 Body Mass Index (BMI) 40.0 and over, adult: Secondary | ICD-10-CM | POA: Diagnosis not present

## 2021-05-18 MED ORDER — WEGOVY 0.25 MG/0.5ML SC SOAJ
0.25 MG/0.5ML | SUBCUTANEOUS | 0 refills | Status: DC
Start: 2021-05-18 — End: 2021-05-18

## 2021-05-18 NOTE — Progress Notes (Signed)
Patient: Jennifer Mitchell is a 36 y.o. female who presents today with the following Chief Complaint(s):  Chief Complaint   Patient presents with    Weight Management       Assessment:  Encounter Diagnosis   Name Primary?    Class 3 severe obesity due to excess calories without serious comorbidity with body mass index (BMI) of 40.0 to 44.9 in adult [facility]) Yes       Plan:  1. Class 3 severe obesity due to excess calories without serious comorbidity with body mass index (BMI) of 40.0 to 44.9 in adult [facility])  Will start wegovy. Patient was given samples for the first month dosage. Will follow up in 1 month.       HPI  Patient presents today with concerns of weight. She has been trying to make diet changes and exercise with no improvement. She is interested in trying to loose weight with wegovy.     Current Outpatient Medications   Medication Sig Dispense Refill    levothyroxine (SYNTHROID) 50 MCG tablet TAKE 1 TABLET BY MOUTH ONCE DAILY ON AN EMPTY STOMACH 30 MINUTES BEFORE BREAKFAST FOR 90 DAYS 90 tablet 1    ondansetron (ZOFRAN) 4 MG tablet Take 1 tablet by mouth 3 times daily as needed for Nausea or Vomiting 30 tablet 0     No current facility-administered medications for this visit.       Patient's past medical history, surgical history, family history, medications,and allergies  were all reviewed and updated as appropriate today.      Review of Systems   Constitutional: Negative.    HENT: Negative.     Respiratory: Negative.     Cardiovascular: Negative.    Gastrointestinal: Negative.    Musculoskeletal: Negative.    Skin: Negative.    Psychiatric/Behavioral: Negative.         Physical Exam  Vitals reviewed.   Cardiovascular:      Rate and Rhythm: Normal rate and regular rhythm.      Heart sounds: Normal heart sounds.   Pulmonary:      Effort: Pulmonary effort is normal.      Breath sounds: Normal breath sounds.   Skin:     General: Skin is warm and dry.   Neurological:      Mental Status: She is alert and oriented to  person, place, and time.     Vitals:    05/18/21 1100   BP: 120/68   Pulse: 76   SpO2: 98%       This chart was generated using the Dragon dictation system.  I created this record but it may contain dictation errors due to the limitation of the software.

## 2021-05-19 ENCOUNTER — Ambulatory Visit
Admit: 2021-05-19 | Discharge: 2021-05-19 | Payer: PRIVATE HEALTH INSURANCE | Attending: Internal Medicine | Primary: Registered Nurse

## 2021-05-19 DIAGNOSIS — L298 Other pruritus: Secondary | ICD-10-CM

## 2021-05-19 MED ORDER — TRIAMCINOLONE ACETONIDE 0.1 % EX CREA
0.1 % | CUTANEOUS | 2 refills | Status: AC
Start: 2021-05-19 — End: 2021-07-26

## 2021-05-19 NOTE — Progress Notes (Signed)
[facility]  Tonny Bollman, MD  [phone]    Date of Visit: 05/19/2021    Jennifer Mitchell is a 36 y.o. female who presents for itch. New pt     Chief Complaint:   Chief Complaint   Patient presents with    Other     Itchy skin arms and legs after shower or swimming        History of Present Illness:    Concern:  Itchy skin  Location: Arms, legs  Duration:  Intermittent since 2016; better in pregnancy  Symptoms: When she rubs her arms with a wash cloth or when she shaves her legs she gets intense itching for about 30 minutes afterwards  Previous treatments:    -Aquaphor  -Eczema creams  -OTC cortisone   -Zyrtec 10mg  daily x 1 month  -Changed razors  Effect of current treatment: Not controlled  TSH, CMP WNL from 01/2021  CBC from 11/2019 showed Hgb 11  Pt says separately she has hx eczema that is different form above     Review of Systems:  Gen: Feels well, good sense of health.    Past Medical History, Family History, Surgical History, Medications and Allergies reviewed.    Past Medical History:   Diagnosis Date    Hypothyroidism     IBS (irritable bowel syndrome)      Past Surgical History:   Procedure Laterality Date    CYST REMOVAL         Allergies   Allergen Reactions    Latex Rash    Soy Allergy      Causes diarrhea    Tilactase     Azithromycin Hives    Clindamycin/Lincomycin Nausea And Vomiting     No outpatient medications have been marked as taking for the 05/19/21 encounter (Office Visit) with 07/19/21, MD.           Physical Examination   No acute distress.    Mood clear/affect appropriate.  Alert and oriented.  Mucous membranes moist.  Sclera anicteric.  Visible skin exam was conducted to include the scalp, face, lips/teeth, lids/conjunctiva, ears, neck, right and left hands and forearms and legs below knees was normal with the following exceptions:   -Negative dermatographism  -2-3 mm punctate papules on bilateral posterior arms  -Otherwise no eruption on arms, legs, face  today     Assessment and Plan     1. Other pruritus, extremities--not controlled  -Pt says she never has an associated rash with the pruritus she experiences on arms/legs.   - Etiology of pruritis unclear at this time. Ddx includes underlying etiology vs. Dysesthesia vs. Allergic contact dermatitis (she has hx eczema and there are forms of ACD without rash)  - Will evaluate for possible underlying conditions that could be contributing  - Labs ordered today for further evaluation (see below)  - Start TAC 0.1% ointment BID PRN to affected areas for up to 2 weeks per month  - Start cetirizine 10mg  BID  - Continue aggressive moisturizing with bland emollients + switch to bland skin care products     -Check:   -CBC w/Differential   -Ferritin, serum     -Iron panel, serum   -CMP and TSH 01/2021 were WNL so won't recheck    Return in about 8 weeks (around 07/14/2021).  Return to clinic sooner for any new or changing lesions    Note is transcribed using voice recognition software. Inadvertent computerized transcription errors may be present.  Vallarie Mare, MD

## 2021-05-19 NOTE — Telephone Encounter (Signed)
I received a PA Wegovy 0.25MG /0.5ML auto-injectors.The script in the chart is DISCONTINUED. Is this patient using this medication?   Semaglutide-Weight Management (WEGOVY) 0.25 MG/0.5ML SOAJ SC injection [[id number]]  DISCONTINUED    Order Details  Dose: 0.25 mg Route: SubCUTAneous Frequency: EVERY 7 DAYS   Dispense Quantity: 1 mL Refills: 0          Sig: Inject 0.25 mg into the skin every 7 days         Start Date: 05/18/21 End Date: 05/18/21   Discontinued by: Mellody Life, APRN - CNP on 05/18/2021 14:51   Reason: DOSE ADJUSTMENT         Written Date: 05/18/21 Expiration Date: 05/01     Please advise. Thank you

## 2021-05-19 NOTE — Telephone Encounter (Signed)
Does not need the PA right now, she received a sample for the first month

## 2021-05-19 NOTE — Telephone Encounter (Signed)
Pt received sample of wegovy at appt yesterday. Just to clarify, pt does not need a PA for this currently correct?

## 2021-05-19 NOTE — Patient Instructions (Addendum)
Thank you for visiting [facility] today! Please follow the instructions below as we discussed in clinic:      Start triamcinolone cream twice a day to itchy areas for up to 2 weeks per month  Start Zyrtec 10mg  twice a day  Start bland skin care products from list below  Please get your labs checked    Medications: Corticosteroid; Generic  Fougera   Betamethasone Valerate Lotion (0.1%)   Betamethasone Valerate Lotion (0.1%)   Triamcinolone Acetonide Ointment (0.025% and 0.1%)   Clobetasol Propionate Topical Solution (0.05%)   Betamethasone Dipropionate Ointment (0.05%)   Betamethasone Dipropionate Lotion (0.05%)      Perrigo   Mometasone Furoate Topical Solution (0.1%)   Desonide Ointment [0.05%]   Triamcinolone Acetonide Ointment (0.025%, 0.1% and 0.5%)   Betamethasone Dipropionate Lotion    Fluocinolone Acetonide Topical Scalp Oil, 0.01%      Taro   Desoximetasone Ointment (0.05%)   Hydrocortisone Butyrate Cream (0.1%)   Hydrocortisone Butyrate Ointment (0.1%)   Hydrocortisone Butyrate Solution (0.1%)   Desoximetasone Gel [0.05%]   Desoximetasone Ointment [0.25%]   Desonide Ointment (0.05%)   Oralone Triamcinolone Acetonide Dental Paste (0.1%)   Triamcinolone Acetonide Ointment (0.1%)   Triamcinolone Acetonide Dental Paste [0.1%]   Clobetasol Propionate Topical Solution       Teva   Fluocinonide Ointment [0.05]   Beta-Val Lotion (0.1%)     Medications: Miscellaneous, Rx  Fougera  Tacrolimus 0.03% Ointment      Perrigo  Tacrolimus Ointment 0.03%      Protopic  0.03% or 0.1% Ointment     Antiperspirants\Deodorants  Alaway  Deoderant      Certain Dri  Prescription Strength Clinical Roll-On Antiperspirant      Cleure   Roll-On or Spray Deodorant, Aluminum Free   Stick Deodorant, , Unscented      Kiss My Face Liquid Art gallery manager on [place], Fragrance Free      Mitchum  Mitchum For Men Advanced Invisible Roll-On Antiperspirant & Deodorant,  Unscented      Sure  Antiperspirant & Deodorant Aerosol, Unscented      Vanicream   Antiperspirant/Deodorant   Deodorant     Hair Care: Conditioners  Cleure  Replenishing Conditioner      DHS   DHS Conditioning Rinse With Panthenol   DHS Color Safe Rinse With Panthenol      No-Nothing  Fragrance Free Very Sensitive Moisture Conditioner      TrueCider  Creamy Conditioner      VMV Hypoallergenics  Essence Skin-Saving Milk Conditioner     Hair Care: Shampoos  Becky's Tallow Treasures  Unscented Shampoo Soap      Cleure  Shampoo, Hydrating & Volumizing      CLn   Cln Healthy Scalp Shampoo   Moisture Rich Gentle Shampoo    Cln 2-in-1 Gentle Wash and Shampoo      Clover Garden Soaps  Fragrance Free Solid Shampoo Bar      J.R. Liggett's  Moisturizing Formula Shampoo Bar      [place]  Shampoo Bar, Avocado Oil, Fragrance-Free      Skinny & Co.  Clarifying Rangely      The Soap Insurance claims handler Milk Fragrance Free Shampoo Bar      TrueCider  True Cider Shampoo and Body Wash      VMV Hypoallergenics   Essence Training and development officer Clark Wash Hair +  Body "Big Softie" Shampoo   Essence Skin-Saving Superwash Hair + Body Milk Shampoo      Whiff   Unscented Shampoo Bar     Hair Care: Shampoos, Dry  Dove  Care Between Washes Unscented Dry Shampoo      No-Nothing  Sensitive Dry Shampoo, Fragrance Free      Not Your Mother's  Clean Freak Unscented Dry Shampoo     Hair Care: Stylers and Treatments  Biolage by Matrix  R.A.W. Texturizing Styling Hair Spray (d)      Cleure   Hair Styling Gel, Medium Hold   Hair Spray, Firm Hold      Clinique  Hair Care Non-Aerosol Hairspray, Gentle Hold      Free & Clear  Styling and Finishing Hair Spray, Soft Hold (d)      No-Nothing   Fragrance Free Very Sensitive And Super Strong Hairspray   Fragrance Free Very Sensitive And Strong Hairspray      Vanicream   Hair Gel, For Sensitive Skin   Hair Spray, Firm Hold     Skin Care: Hand Sanitizer  Babyganics  Alcohol-Free Foaming Control and instrumentation engineer, Fragrance  Free      Kiss My Agricultural engineer with Alcohol      La Roche-Posay   Gel Hydro-Alcoolique Purifying Hand Gel   Alcohol Antiseptic Hand Sanitizer      VMV Hypoallergenics  Grandma Minnie's Kid Gloves Monolaurin Moisturizing "Make-It-Cleaner" Hand Gel      Whole Foods 365  Refreshing Gel Hand Sanitizer, Fragrance Free     Skin Care: Moisturizers  AmLactin   Daily Moisturizing Body Lotion   Rapid Relief Restoring Lotion      Aveeno   Baby Eczema Therapy Moisturizing Cream   Eczema Therapy Daily Moisturizing Cream   Eczema Therapy Hand And Face Cream (d)   Daily Moisturizing Sheer Hydration Lotion      [employer] Corporation Sleep Potion Night Cream      CeraVe   Moisturizing Cream   Daily Moisturizing Lotion   P.M. Facial Moisturizing Lotion   SA Lotion For Rough Bumpy Skin   Therapeutic Hand Cream   SA Cream For Rough Bumpy Skin   Healing Ointment   Baby Moisturizing Lotion   Baby Moisturizing Cream   Psoriasis Moisturizing Cream   Baby Healing Ointment      Cetaphil   Daily Hydrating Lotion with Hyaluronic Acid   Rich Hydrating Cream   Intensive Moisturizing Cream   Cracked Skin Repair Lotion      Cleure  Oil Free Facial Lotion for Sensitive Skin      Dove   DermaSeries Eczema Relief Body Lotion   Baby Dove Sensitive Moisture Lotion   DermaSeries Dry Skin Relief Body Cream      Elta MD   Moisturizer, Intense Moisturizer   Face, Hands & Body Lotion   AM Therapy Facial Moisturizer   So Silky Hand Crme      Lubriderm   Advanced Therapy Fragrance-Free Lotion   Advanced Therapy Fragrance-Free Lotion   Daily Moisture Lotion, Fragrance Free      Neutrogena  [place] Formula Hand Cream, Fragrance Free      Palmer's  Coconut Oil Formula Body Oil      TrueCider  Face & Body Serum      Vanicream   Daily Facial Moisturizer For Sensitive Skin   Moisturizing Ointment      Vaseline   Intensive Care Advanced Repair Unscented Lotion   Vaseline Original  Healing Jelly      VMV Hypoallergenics   Grandma Minnie's The Big,  Brave Boo-Boo Balm   Grandma Minnie's Mommycoddling All-Over Lotion   Red Better Calm-The-Heck-Down Balm Steroid-Free Salve   Essence Hand + Body Smoother Lotion     Facial Moisturizers with SPF  Aveeno  Daily Moisturizing Lotion With Sunscreen SPF 15      CeraVe   A.M. Facial Moisturizing Lotion SPF 30   Skin Renewing Day Cream SPF 30      Cetaphil  DermaControl Oil Absorbing Moisturizer SPF 30      Dove  DermaSeries Dry Skin Relief Face Cream SPF 15      Eucerin  Redness Relief Day Lotion Broad Spectrum SPF 15      La Roche-Posay  Toleriane Double Repair Face Moisturizer SPF 30      Neutrogena  Hydro Boost Hyaluronic Acid Moisturizer SPF 50      Olay   Regenerist Hydrating Moisturizer with Mineral SPF 15 (Fragrance Free)   Regenerist Hydrating Moisturizer with Mineral SPF 30 (Fragrance Free)     Skin Care: Soaps\Cleansers  AB  Skincare Cleanser      CeraVe   Hydrating Facial Cleanser   Eczema Body Wash (d)   Soothing Body Wash      Cetaphil  Gentle Skin Cleanser      Cleure   Glycerine Face/Body SLS Free Bar, Unscented   Pure Clarifying Face & Body [employer] Corporation   All About Clean Foaming Facial Soap   All About Clean Rinse-Off Foaming Cleanser      CLn  Moisture Balancing Facial Cleanser      Dove  DermaSeries Dry Skin Relief Face Wash      Free & Clear  Liquid Cleanser      Kiss My Chartered certified accountant, Fragrance Free      Neutrogena  Transparent Facial Bar Fragrance-Free      Tom's of [place]  Fragrance Free Sensitive Beauty Bar with Aloe Vera (      TrueCider  Gentle Creamy Cleanser      VMV Hypoallergenics  Moisture Rich Creammmy Cleansing Milk For Dry Skin       Skin Care: Sunscreens  Banana Boat   Kids Tear-Free Sunscreen Lotion SPF 50 (d)   Simply Protect Baby Tear-Free Mineral-Based Sunscreen Lotion SPF 50+ (d)   Sport Mineral Enriched Sunscreen Spray SPF 50+      Cleure  Sunscreen SPF 30 for Sensitive Skin      Clinique  Superdefense [place] Block Broad Spectrum SPF 50 Daily Energy + Face Protector       Coppertone  Kids Tear Free Sunscreen Lotion SPF 50      Elta MD   UV AERO Full-Body Sunscreen spray SPF 45   UV Elements Tinted Facial Sunscreen Broad Spectrum SPF 44   UV Replenish Facial Sunscreen Broad Spectrum SPF 44      Solbar  Thirty SPF 30     Household Products: Research scientist (life sciences), Free & Clear   Powerful Clean Dishwasher Detergent Powder, Free & Clear      Biokleen  Free & Clear Automatic Dish Pods      Whole Foods 365  Dishwasher Detergent, Packs, unscented     Household Products: Market researcher, Free & Clear      All  Free Clear Detergent Powder  Dreft  Family Freindly Liquid Detergent, Unscented      Tide  Ultra Tide Free & Gentle Powdered Detergent      Whole Foods   Organic Laundry Detergent, Unscented   Organic Baby Laundry Detergent Liquid, Unscented      Whole Foods 365   Powdered Laundry Detergent, Unscented   Baby Laundry Detergent Liquid, unscented     Household Products: International aid/development worker Softener\Bleach\Additives  7th Generation   Chlorine Free [place], Free & Nurse, children's, Free & Public affairs consultant Free & Clear Bleach      Whole Foods 365  Fabric Softening Chief Operating Officer, unscented     Production manager 3 Hydrating Shave Gel Razor Cartridges   Hydro 5 Hydrating Shave Gel Razor Cartridges      Vanicream  Shave Cream for Sensitive Skin      VMV Hypoallergenics   1635 Smoothening Aftershave Solution   1635 Pre-Shave Barber Oil For All Skin Types     Wipes  Pampers   Sensitive Baby Wipes   Aqua Pure Wipes

## 2021-06-09 ENCOUNTER — Ambulatory Visit
Admit: 2021-06-09 | Discharge: 2021-06-09 | Payer: PRIVATE HEALTH INSURANCE | Attending: Registered Nurse | Primary: Registered Nurse

## 2021-06-09 DIAGNOSIS — Z6841 Body Mass Index (BMI) 40.0 and over, adult: Secondary | ICD-10-CM | POA: Diagnosis not present

## 2021-06-09 MED ORDER — WEGOVY 0.5 MG/0.5ML SC SOAJ
0.5 MG/ML | SUBCUTANEOUS | 0 refills | Status: AC
Start: 2021-06-09 — End: 2021-07-07

## 2021-06-09 NOTE — Progress Notes (Signed)
Patient: Jennifer Mitchell is a 36 y.o. female who presents today with the following Chief Complaint(s):  Chief Complaint   Patient presents with    1 Month Follow-Up       Assessment:  Encounter Diagnosis   Name Primary?    Class 3 severe obesity due to excess calories with body mass index (BMI) of 40.0 to 44.9 in adult, unspecified whether serious comorbidity present (HCC) Yes       Plan:  1. Class 3 severe obesity due to excess calories with body mass index (BMI) of 40.0 to 44.9 in adult, unspecified whether serious comorbidity present (HCC)  Tolerated the first dosage well, will increase to the 0.5 weekly. Follow up in 1 month. If she is not able to use the savings card will call it in to the compounding pharmacy at [phone]. We are currently out of samples in office.  - Semaglutide-Weight Management (WEGOVY) 0.5 MG/0.5ML SOAJ SC injection; Inject 0.5 mg into the skin every 7 days  Dispense: 2 mL; Refill: 0      HPI  Patient presents today to follow up on wegovy and weight loss. She states she has been tolerating it well. She noticed indigestion the first couple of weeks but by the 3rd week she states she tolerated it well. She is leaving tomorrow for vacation and is hoping to pick up the next dose today.  She did lose 8 pounds in the past mont.         Current Outpatient Medications   Medication Sig Dispense Refill    Semaglutide-Weight Management (WEGOVY) 0.5 MG/0.5ML SOAJ SC injection Inject 0.5 mg into the skin every 7 days 2 mL 0    triamcinolone (KENALOG) 0.1 % cream Apply to affected areas of the skin twice daily for up to 2 weeks or until improved. 80 g 2    levothyroxine (SYNTHROID) 50 MCG tablet TAKE 1 TABLET BY MOUTH ONCE DAILY ON AN EMPTY STOMACH 30 MINUTES BEFORE BREAKFAST FOR 90 DAYS 90 tablet 1    ondansetron (ZOFRAN) 4 MG tablet Take 1 tablet by mouth 3 times daily as needed for Nausea or Vomiting 30 tablet 0     No current facility-administered medications for this visit.       Patient's past  medical history, surgical history, family history, medications,and allergies  were all reviewed and updated as appropriate today.      Review of Systems   Constitutional: Negative.    HENT: Negative.     Respiratory: Negative.     Cardiovascular: Negative.    Gastrointestinal: Negative.    Musculoskeletal: Negative.    Skin: Negative.    Neurological: Negative.  Negative for dizziness and headaches.   Psychiatric/Behavioral: Negative.         Physical Exam  Vitals reviewed.   Cardiovascular:      Rate and Rhythm: Normal rate and regular rhythm.      Heart sounds: Normal heart sounds.   Pulmonary:      Effort: Pulmonary effort is normal.      Breath sounds: Normal breath sounds.   Skin:     General: Skin is warm and dry.   Neurological:      Mental Status: She is alert and oriented to person, place, and time.     Vitals:    06/09/21 0917   BP: 114/64   Pulse: 84   SpO2: 99%       This chart was generated using the Dragon dictation system.  I  created this record but it may contain dictation errors due to the limitation of the software.

## 2021-06-09 NOTE — Telephone Encounter (Signed)
-----   Message from Winslow West sent at 06/09/2021  9:58 AM EDT -----  Subject: Message to Provider    QUESTIONS  Information for Provider? Pt was seen today and went to get the   prescription that pcp sent to pharnmacy but they need a pre-authorization   on medication wegovy . If someone can give the pt a call back.  ---------------------------------------------------------------------------  --------------  Cleotis Lema INFO  [phone]; OK to leave message on voicemail  ---------------------------------------------------------------------------  --------------  SCRIPT ANSWERS  Relationship to Patient? Self

## 2021-06-09 NOTE — Telephone Encounter (Signed)
Error

## 2021-06-09 NOTE — Telephone Encounter (Signed)
2 pharmacy pa's were sent over today for patient and routed to PA dept. See media

## 2021-06-09 NOTE — Telephone Encounter (Signed)
From: Cherre Robins  To: Armandina Stammer  Sent: 06/09/2021 9:57 AM EDT  Subject: [id number]    The pharmacy is asking for a pre authorization for it? Is that normal when using the savings card?

## 2021-06-09 NOTE — Telephone Encounter (Signed)
Submitted PA for [employer] 0.5MG /0.5ML auto-injectors  Via CMM Key: [id number] STATUS: PENDING.    Follow up done daily; if no response in three days we will refax for status check.  If another three days goes by with no response we will call the insurance for status.

## 2021-06-10 NOTE — Telephone Encounter (Signed)
Any updates by chance?

## 2021-06-11 NOTE — Telephone Encounter (Signed)
No word yet. Unfortunately they tell us up to 7 business days.

## 2021-06-15 NOTE — Telephone Encounter (Signed)
Pt would like to use compounding pharmacy- called into [place] [facility] as [facility] does not have this rx.

## 2021-06-15 NOTE — Telephone Encounter (Signed)
[facility] does not have wegovy in stock and [facility] [place].

## 2021-06-15 NOTE — Telephone Encounter (Signed)
Does she want to call around and see if other pharmacies have it available prior to using the compounding pharmacy?

## 2021-06-15 NOTE — Telephone Encounter (Signed)
Approval for Wegovy attached.    Please notify patient. Thank you.

## 2021-06-22 ENCOUNTER — Encounter

## 2021-06-22 MED ORDER — ONDANSETRON HCL 4 MG PO TABS
4 MG | ORAL_TABLET | Freq: Three times a day (TID) | ORAL | 0 refills | Status: AC | PRN
Start: 2021-06-22 — End: ?

## 2021-06-22 NOTE — Telephone Encounter (Signed)
Last Office Visit  -  06/09/21  Next Office Visit  -  07/07/21    Last Filled  -  03/17/20  Last UDS -    Contract -

## 2021-07-07 ENCOUNTER — Ambulatory Visit
Admit: 2021-07-07 | Discharge: 2021-07-07 | Payer: PRIVATE HEALTH INSURANCE | Attending: Registered Nurse | Primary: Registered Nurse

## 2021-07-07 ENCOUNTER — Encounter

## 2021-07-07 DIAGNOSIS — Z6841 Body Mass Index (BMI) 40.0 and over, adult: Secondary | ICD-10-CM

## 2021-07-07 DIAGNOSIS — L298 Other pruritus: Secondary | ICD-10-CM | POA: Diagnosis not present

## 2021-07-07 LAB — CBC WITH AUTO DIFFERENTIAL
Basophils %: 0.6 %
Basophils Absolute: 0 10*3/uL (ref 0.0–0.2)
Eosinophils %: 1.4 %
Eosinophils Absolute: 0.1 10*3/uL (ref 0.0–0.6)
Hematocrit: 35.9 % — ABNORMAL LOW (ref 36.0–48.0)
Hemoglobin: 11.9 g/dL — ABNORMAL LOW (ref 12.0–16.0)
Lymphocytes %: 22.8 %
Lymphocytes Absolute: 1.6 10*3/uL (ref 1.0–5.1)
MCH: 25.6 pg — ABNORMAL LOW (ref 26.0–34.0)
MCHC: 33.1 g/dL (ref 31.0–36.0)
MCV: 77.4 fL — ABNORMAL LOW (ref 80.0–100.0)
MPV: 9 fL (ref 5.0–10.5)
Monocytes %: 6.9 %
Monocytes Absolute: 0.5 10*3/uL (ref 0.0–1.3)
Neutrophils %: 68.3 %
Neutrophils Absolute: 4.7 10*3/uL (ref 1.7–7.7)
Platelets: 260 10*3/uL (ref 135–450)
RBC: 4.64 M/uL (ref 4.00–5.20)
RDW: 15.8 % — ABNORMAL HIGH (ref 12.4–15.4)
WBC: 6.9 10*3/uL (ref 4.0–11.0)

## 2021-07-07 LAB — IRON AND TIBC
Iron Saturation: 22 % (ref 15–50)
Iron: 73 ug/dL (ref 37–145)
TIBC: 328 ug/dL (ref 260–445)

## 2021-07-07 LAB — FERRITIN: Ferritin: 20.5 ng/mL (ref 15.0–150.0)

## 2021-07-07 MED ORDER — WEGOVY 1 MG/0.5ML SC SOAJ
1 MG/0.5ML | SUBCUTANEOUS | 0 refills | Status: DC
Start: 2021-07-07 — End: 2021-08-06

## 2021-07-07 NOTE — Progress Notes (Signed)
Patient: Jennifer Mitchell is a 36 y.o. female who presents today with the following Chief Complaint(s):  Chief Complaint   Patient presents with    1 Month Follow-Up    Weight Management       Assessment:  Encounter Diagnosis   Name Primary?    Class 3 severe obesity due to excess calories without serious comorbidity with body mass index (BMI) of 40.0 to 44.9 in adult [facility]) Yes       Plan:  1. Class 3 severe obesity due to excess calories without serious comorbidity with body mass index (BMI) of 40.0 to 44.9 in adult [facility])  Stable on Wegovy. Continue on and increase to 1 mg weekly. Follow up in 1 month.       HPI  Patient presents today for follow up on weight. She has been taking wegovy from a compound pharmacy. She states she is tolerating it well. She did have some nausea when she increased to the 0.5 dose but symptoms have improved now. She has lost about 20 pounds from when she started.       Current Outpatient Medications   Medication Sig Dispense Refill    Semaglutide-Weight Management (WEGOVY) 1 MG/0.5ML SOAJ SC injection Inject 1 mg into the skin every 7 days 2 mL 0    ondansetron (ZOFRAN) 4 MG tablet Take 1 tablet by mouth 3 times daily as needed for Nausea or Vomiting 30 tablet 0    triamcinolone (KENALOG) 0.1 % cream Apply to affected areas of the skin twice daily for up to 2 weeks or until improved. 80 g 2    levothyroxine (SYNTHROID) 50 MCG tablet TAKE 1 TABLET BY MOUTH ONCE DAILY ON AN EMPTY STOMACH 30 MINUTES BEFORE BREAKFAST FOR 90 DAYS 90 tablet 1     No current facility-administered medications for this visit.       Patient's past medical history, surgical history, family history, medications,and allergies  were all reviewed and updated as appropriate today.      Review of Systems   Constitutional: Negative.    HENT: Negative.     Respiratory: Negative.     Cardiovascular: Negative.    Gastrointestinal: Negative.    Genitourinary: Negative.    Musculoskeletal: Negative.    Skin: Negative.     Neurological: Negative.    Psychiatric/Behavioral: Negative.         Physical Exam  Vitals reviewed.   Cardiovascular:      Rate and Rhythm: Normal rate and regular rhythm.      Heart sounds: Normal heart sounds.   Pulmonary:      Effort: Pulmonary effort is normal.      Breath sounds: Normal breath sounds.   Skin:     General: Skin is warm and dry.   Neurological:      Mental Status: She is alert and oriented to person, place, and time.   Psychiatric:         Mood and Affect: Mood normal.         Behavior: Behavior normal.     Vitals:    07/07/21 1009   BP: 108/60   Pulse: 83   SpO2: 97%       This chart was generated using the Dragon dictation system.  I created this record but it may contain dictation errors due to the limitation of the software.

## 2021-07-07 NOTE — Telephone Encounter (Signed)
Rx for wegovy called into [facility]. Pharmacists states that the FDA is holding medication right now and they do not know when they will be able to fill this rx. It could be a few weeks or it could be a few days per pharmacist. Rx given to pharmacist to be filled once medication is in. Pt notified about situation, understood, and will contact compounding pharmacy if she does not hear from them soon.

## 2021-07-07 NOTE — Telephone Encounter (Addendum)
Compounding Pharmacy requesting call back about compounding dosage.  PH> [phone] Contact name: Jennifer Mitchell.    Semaglutide-Weight Management (WEGOVY) 1 MG/0.5ML SOAJ SC injection

## 2021-07-08 NOTE — Telephone Encounter (Signed)
Contacted pharmacy- just needed clarification on the dosage that pt would be injecting, pharmacist notified "Inject 1 mg into the skin every 7 days"

## 2021-07-12 ENCOUNTER — Encounter

## 2021-07-13 ENCOUNTER — Encounter: Payer: PRIVATE HEALTH INSURANCE | Attending: Internal Medicine | Primary: Registered Nurse

## 2021-07-15 NOTE — Telephone Encounter (Signed)
From: Kern Reap  To: Mellody Life  Sent: 07/14/2021 6:40 PM EDT  Subject: Back up Plan for Semiglutide    I know I've been a pest with all of this and things haven't exactly gone to plan with the meds. Since the FDA is still withholding the compounding pharmacy from filling the Semiglutide is there any other options? I just dont want to lose momentum and keep moving forward. I know I have 90-100lb to lose before I am at a healthy bmi.    Thanks

## 2021-07-19 MED ORDER — LEVOTHYROXINE SODIUM 50 MCG PO TABS
50 MCG | ORAL_TABLET | ORAL | 1 refills | Status: DC
Start: 2021-07-19 — End: 2022-02-01

## 2021-07-19 NOTE — Telephone Encounter (Signed)
Last Office Visit  -  07/07/21  Next Office Visit  -  08/06/21    Last Filled  -    Last UDS -    Contract -

## 2021-07-24 ENCOUNTER — Inpatient Hospital Stay: Payer: PRIVATE HEALTH INSURANCE | Primary: Registered Nurse

## 2021-07-24 DIAGNOSIS — D509 Iron deficiency anemia, unspecified: Secondary | ICD-10-CM

## 2021-07-26 ENCOUNTER — Ambulatory Visit
Admit: 2021-07-26 | Discharge: 2021-07-26 | Payer: PRIVATE HEALTH INSURANCE | Attending: Internal Medicine | Primary: Registered Nurse

## 2021-07-26 DIAGNOSIS — L298 Other pruritus: Secondary | ICD-10-CM

## 2021-07-26 MED ORDER — TRIAMCINOLONE ACETONIDE 0.1 % EX CREA
0.1 % | CUTANEOUS | 2 refills | Status: AC
Start: 2021-07-26 — End: ?

## 2021-07-26 NOTE — Progress Notes (Signed)
[facility]  Tonny Bollman, MD  [phone]    Date of Visit: 07/26/2021  LV 05/19/2021    Jennifer Mitchell is a 36 y.o. female who presents for itch.     Chief Complaint:   Chief Complaint   Patient presents with    Follow-up     itching        History of Present Illness:    Concern:  Itchy skin  Location: Arms, legs  Duration:  Intermittent since 2016; better in pregnancy  Symptoms: When she rubs her arms with a wash cloth or when she shaves her legs she gets intense itching for about 30 minutes afterwards  Previous treatments:    -Aquaphor  -Eczema creams  -OTC cortisone   -Zyrtec 10mg  daily x 1 month  -Changed razors  Current trmt:  -Zyrtec 10mg  BID  -TMC cream BID PRN  -Changed to bland skin care   Effect of current treatment: Improving  TSH, CMP WNL from 01/2021  CBC from 11/2019 showed Hgb 11-->PCP ordered further studies   Pt says separately she has hx eczema that is different form above     Review of Systems:  Gen: Feels well, good sense of health.    Past Medical History, Family History, Surgical History, Medications and Allergies reviewed.    Past Medical History:   Diagnosis Date    Hypothyroidism     IBS (irritable bowel syndrome)      Past Surgical History:   Procedure Laterality Date    CYST REMOVAL         Allergies   Allergen Reactions    Latex Rash    Soy Allergy      Causes diarrhea    Tilactase     Azithromycin Hives    Clindamycin/Lincomycin Nausea And Vomiting     Outpatient Medications Marked as Taking for the 07/26/21 encounter (Office Visit) with 12/2019, MD   Medication Sig Dispense Refill    levothyroxine (SYNTHROID) 50 MCG tablet TAKE 1 TABLET BY MOUTH ONCE DAILY ON AN EMPTY STOMACH 30 MINUTES BEFORE BREAKFAST 90 tablet 1    Semaglutide-Weight Management (WEGOVY) 1 MG/0.5ML SOAJ SC injection Inject 1 mg into the skin every 7 days 2 mL 0    ondansetron (ZOFRAN) 4 MG tablet Take 1 tablet by mouth 3 times daily as needed for Nausea or Vomiting 30 tablet 0     triamcinolone (KENALOG) 0.1 % cream Apply to affected areas of the skin twice daily for up to 2 weeks or until improved. 80 g 2           Physical Examination   No acute distress.    Mood clear/affect appropriate.  Alert and oriented.  Mucous membranes moist.  Sclera anicteric.  Visible skin exam was conducted to include the scalp, face, lips, ears, neck, right and left hands and forearms and legs below knees was normal with the following exceptions:   -No rash today     Assessment and Plan     1. Other pruritus, extremities--improving. Itch without rash  -Improving s/p topical steroid, Zyrtec, and bland skin care   -Rec continue work up with PCP for underlying reason of low Hgb which could cause pruritus in some pts. Otherwise cont remaining up to date on cancer screenings etc  -Recommend:  - Cont TAC 0.1% cream BID PRN to affected areas for up to 2 weeks per month  - Cont cetirizine 10mg  BID for another month--then dec to 10mg  daily   -  Continue aggressive moisturizing with bland emollients + switch to bland skin care products     Discussed with patient that I am departing from [facility] after August 16, 2021 and the patient will need to start following up with another dermatologist in the area who takes their insurance. Recommend looking on their insurance website or calling to see which dermatologists are in network      Note is transcribed using voice recognition software. Inadvertent computerized transcription errors may be present.    Jacquelyne Balint, MD

## 2021-07-26 NOTE — Patient Instructions (Addendum)
Thank you for visiting [facility] today! Please follow the instructions below as we discussed in clinic:      Continue triamcinolone cream twice a day to itchy areas for up to 2 weeks per month  Continue Zyrtec 10mg  twice a day  Continue bland skin care products from list below    Medications: Corticosteroid; Generic  Fougera   Betamethasone Valerate Lotion (0.1%)   Betamethasone Valerate Lotion (0.1%)   Triamcinolone Acetonide Ointment (0.025% and 0.1%)   Clobetasol Propionate Topical Solution (0.05%)   Betamethasone Dipropionate Ointment (0.05%)   Betamethasone Dipropionate Lotion (0.05%)      Perrigo   Mometasone Furoate Topical Solution (0.1%)   Desonide Ointment [0.05%]   Triamcinolone Acetonide Ointment (0.025%, 0.1% and 0.5%)   Betamethasone Dipropionate Lotion    Fluocinolone Acetonide Topical Scalp Oil, 0.01%      Taro   Desoximetasone Ointment (0.05%)   Hydrocortisone Butyrate Cream (0.1%)   Hydrocortisone Butyrate Ointment (0.1%)   Hydrocortisone Butyrate Solution (0.1%)   Desoximetasone Gel [0.05%]   Desoximetasone Ointment [0.25%]   Desonide Ointment (0.05%)   Oralone Triamcinolone Acetonide Dental Paste (0.1%)   Triamcinolone Acetonide Ointment (0.1%)   Triamcinolone Acetonide Dental Paste [0.1%]   Clobetasol Propionate Topical Solution       Teva   Fluocinonide Ointment [0.05]   Beta-Val Lotion (0.1%)     Medications: Miscellaneous, Rx  Fougera  Tacrolimus 0.03% Ointment      Perrigo  Tacrolimus Ointment 0.03%      Protopic  0.03% or 0.1% Ointment     Antiperspirants\Deodorants  Alaway  Deoderant      Certain Dri  Prescription Strength Clinical Roll-On Antiperspirant      Cleure   Roll-On or Spray Deodorant, Aluminum Free   Stick Deodorant, , Unscented      Kiss My Face Liquid Art gallery manager on [place], Fragrance Free      Mitchum  Mitchum For Men Advanced Invisible Roll-On Antiperspirant & Deodorant, Unscented      Sure   Antiperspirant & Deodorant Aerosol, Unscented      Vanicream   Antiperspirant/Deodorant   Deodorant     Hair Care: Conditioners  Cleure  Replenishing Conditioner      DHS   DHS Conditioning Rinse With Panthenol   DHS Color Safe Rinse With Panthenol      No-Nothing  Fragrance Free Very Sensitive Moisture Conditioner      TrueCider  Creamy Conditioner      VMV Hypoallergenics  Essence Skin-Saving Milk Conditioner     Hair Care: Shampoos  Becky's Tallow Treasures  Unscented Shampoo Soap      Cleure  Shampoo, Hydrating & Volumizing      CLn   Cln Healthy Scalp Shampoo   Moisture Rich Gentle Shampoo    Cln 2-in-1 Gentle Wash and Shampoo      Clover Garden Soaps  Fragrance Free Solid Shampoo Bar      J.R. Liggett's  Moisturizing Formula Shampoo Bar      [place]  Shampoo Bar, Avocado Oil, Fragrance-Free      Skinny & Co.  Clarifying [place]      The Soap Insurance claims handler Milk Fragrance Free Shampoo Bar      TrueCider  True Cider Shampoo and Body Wash      VMV Hypoallergenics   Essence Training and development officer Clark Wash Hair + Body "Big Softie" Shampoo  Essence Skin-Saving Superwash Hair + Body Milk Shampoo      Whiff   Unscented Shampoo Bar     Hair Care: Shampoos, Dry  Dove  Care Between Washes Unscented Dry Shampoo      No-Nothing  Sensitive Dry Shampoo, Fragrance Free      Not Your Mother's  Clean Freak Unscented Dry Shampoo     Hair Care: Stylers and Treatments  Biolage by Matrix  R.A.W. Texturizing Styling Hair Spray (d)      Cleure   Hair Styling Gel, Medium Hold   Hair Spray, Firm Hold      Clinique  Hair Care Non-Aerosol Hairspray, Gentle Hold      Free & Clear  Styling and Finishing Hair Spray, Soft Hold (d)      No-Nothing   Fragrance Free Very Sensitive And Super Strong Hairspray   Fragrance Free Very Sensitive And Strong Hairspray      Vanicream   Hair Gel, For Sensitive Skin   Hair Spray, Firm Hold     Skin Care: Hand Sanitizer  Babyganics  Alcohol-Free Foaming Control and instrumentation engineer, Fragrance Free      Kiss My  Agricultural engineer with Alcohol      La Roche-Posay   Gel Hydro-Alcoolique Purifying Hand Gel   Alcohol Antiseptic Hand Sanitizer      VMV Hypoallergenics  Grandma Minnie's Kid Gloves Monolaurin Moisturizing "Make-It-Cleaner" Hand Gel      Whole Foods 365  Refreshing Gel Hand Sanitizer, Fragrance Free     Skin Care: Moisturizers  AmLactin   Daily Moisturizing Body Lotion   Rapid Relief Restoring Lotion      Aveeno   Baby Eczema Therapy Moisturizing Cream   Eczema Therapy Daily Moisturizing Cream   Eczema Therapy Hand And Face Cream (d)   Daily Moisturizing Sheer Hydration Lotion      [employer] Corporation Sleep Potion Night Cream      CeraVe   Moisturizing Cream   Daily Moisturizing Lotion   P.M. Facial Moisturizing Lotion   SA Lotion For Rough Bumpy Skin   Therapeutic Hand Cream   SA Cream For Rough Bumpy Skin   Healing Ointment   Baby Moisturizing Lotion   Baby Moisturizing Cream   Psoriasis Moisturizing Cream   Baby Healing Ointment      Cetaphil   Daily Hydrating Lotion with Hyaluronic Acid   Rich Hydrating Cream   Intensive Moisturizing Cream   Cracked Skin Repair Lotion      Cleure  Oil Free Facial Lotion for Sensitive Skin      Dove   DermaSeries Eczema Relief Body Lotion   Baby Dove Sensitive Moisture Lotion   DermaSeries Dry Skin Relief Body Cream      Elta MD   Moisturizer, Intense Moisturizer   Face, Hands & Body Lotion   AM Therapy Facial Moisturizer   So Silky Hand Crme      Lubriderm   Advanced Therapy Fragrance-Free Lotion   Advanced Therapy Fragrance-Free Lotion   Daily Moisture Lotion, Fragrance Free      Neutrogena  [place] Formula Hand Cream, Fragrance Free      Palmer's  Coconut Oil Formula Body Oil      TrueCider  Face & Body Serum      Vanicream   Daily Facial Moisturizer For Sensitive Skin   Moisturizing Ointment      Vaseline   Intensive Care Advanced Repair Unscented Lotion   Vaseline Original Healing Jelly  VMV Hypoallergenics   Grandma Minnie's The Big, Brave Boo-Boo Balm    Grandma Minnie's Mommycoddling All-Over Lotion   Red Better Calm-The-Heck-Down Balm Steroid-Free Salve   Essence Hand + Body Smoother Lotion     Facial Moisturizers with SPF  Aveeno  Daily Moisturizing Lotion With Sunscreen SPF 15      CeraVe   A.M. Facial Moisturizing Lotion SPF 30   Skin Renewing Day Cream SPF 30      Cetaphil  DermaControl Oil Absorbing Moisturizer SPF 30      Dove  DermaSeries Dry Skin Relief Face Cream SPF 15      Eucerin  Redness Relief Day Lotion Broad Spectrum SPF 15      La Roche-Posay  Toleriane Double Repair Face Moisturizer SPF 30      Neutrogena  Hydro Boost Hyaluronic Acid Moisturizer SPF 50      Olay   Regenerist Hydrating Moisturizer with Mineral SPF 15 (Fragrance Free)   Regenerist Hydrating Moisturizer with Mineral SPF 30 (Fragrance Free)     Skin Care: Soaps\Cleansers  AB  Skincare Cleanser      CeraVe   Hydrating Facial Cleanser   Eczema Body Wash (d)   Soothing Body Wash      Cetaphil  Gentle Skin Cleanser      Cleure   Glycerine Face/Body SLS Free Bar, Unscented   Pure Clarifying Face & Body [employer] Corporation   All About Clean Foaming Facial Soap   All About Clean Rinse-Off Foaming Cleanser      CLn  Moisture Balancing Facial Cleanser      Dove  DermaSeries Dry Skin Relief Face Wash      Free & Clear  Liquid Cleanser      Kiss My Chartered certified accountant, Fragrance Free      Neutrogena  Transparent Facial Bar Fragrance-Free      Tom's of [place]  Fragrance Free Sensitive Beauty Bar with Aloe Vera (      TrueCider  Gentle Creamy Cleanser      VMV Hypoallergenics  Moisture Rich Creammmy Cleansing Milk For Dry Skin       Skin Care: Sunscreens  Banana Boat   Kids Tear-Free Sunscreen Lotion SPF 50 (d)   Simply Protect Baby Tear-Free Mineral-Based Sunscreen Lotion SPF 50+ (d)   Sport Mineral Enriched Sunscreen Spray SPF 50+      Cleure  Sunscreen SPF 30 for Sensitive Skin      Clinique  Superdefense [place] Block Broad Spectrum SPF 50 Daily Energy + Face Protector      Coppertone  Kids  Tear Free Sunscreen Lotion SPF 50      Elta MD   UV AERO Full-Body Sunscreen spray SPF 45   UV Elements Tinted Facial Sunscreen Broad Spectrum SPF 44   UV Replenish Facial Sunscreen Broad Spectrum SPF 44      Solbar  Thirty SPF 30     Household Products: Research scientist (life sciences), Free & Clear   Powerful Clean Dishwasher Detergent Powder, Free & Clear      Biokleen  Free & Clear Automatic Dish Pods      Whole Foods 365  Dishwasher Detergent, Packs, unscented     Household Products: Market researcher, Free & Clear      All  Free Clear Detergent Powder      Dreft  Family Freindly Liquid Detergent,  Unscented      Tide  Ultra Tide Free & Gentle Powdered Detergent      Whole Foods   Organic Laundry Detergent, Unscented   Organic Baby Laundry Detergent Liquid, Unscented      Whole Foods 365   Powdered Laundry Detergent, Unscented   Baby Laundry Detergent Liquid, unscented     Household Products: International aid/development worker Softener\Bleach\Additives  7th Generation   Chlorine Free [place], Free & Nurse, children's, Free & Public affairs consultant Free & Clear Bleach      Whole Foods 365  Fabric Softening Chief Operating Officer, unscented     Production manager 3 Hydrating Shave Gel Razor Cartridges   Hydro 5 Hydrating Shave Gel Razor Cartridges      Vanicream  Shave Cream for Sensitive Skin      VMV Hypoallergenics   1635 Smoothening Aftershave Solution   1635 Pre-Shave Barber Oil For All Skin Types     Wipes  Pampers   Sensitive Baby Wipes   Aqua Pure Wipes

## 2021-07-27 DIAGNOSIS — L298 Other pruritus: Secondary | ICD-10-CM | POA: Diagnosis not present

## 2021-07-27 LAB — HEMOGLOBINOPATHY EVALUATION

## 2021-07-27 LAB — PATH INTERP ELEC HGB

## 2021-08-06 ENCOUNTER — Ambulatory Visit
Admit: 2021-08-06 | Discharge: 2021-08-06 | Payer: PRIVATE HEALTH INSURANCE | Attending: Registered Nurse | Primary: Registered Nurse

## 2021-08-06 DIAGNOSIS — E6609 Other obesity due to excess calories: Secondary | ICD-10-CM

## 2021-08-06 DIAGNOSIS — Z6838 Body mass index (BMI) 38.0-38.9, adult: Secondary | ICD-10-CM | POA: Diagnosis not present

## 2021-08-06 MED ORDER — WEGOVY 1.7 MG/0.75ML SC SOAJ
1.7 MG/0.75ML | SUBCUTANEOUS | 0 refills | Status: DC
Start: 2021-08-06 — End: 2021-09-06

## 2021-08-06 NOTE — Progress Notes (Signed)
Patient: Jennifer Mitchell is a 36 y.o. female who presents today with the following Chief Complaint(s):  Chief Complaint   Patient presents with    1 Month Follow-Up    Weight Loss       Assessment:  Encounter Diagnosis   Name Primary?    Class 2 obesity due to excess calories with body mass index (BMI) of 38.0 to 38.9 in adult, unspecified whether serious comorbidity present Yes       Plan:  1. Class 2 obesity due to excess calories with body mass index (BMI) of 38.0 to 38.9 in adult, unspecified whether serious comorbidity present  Stable on wegovy. Will increase to the 1.7 mg dosage. And follow up in 1 month.       HPI  Patient presents today for follow up on weight loss. She has been on the wegovy 1 mg for the past month. She states she is tolerating it well. She does have nausea with the first injection of the new dosage but states she is able to tolerate it with zofran. She denies any vomiting. She has lost around 26 pounds. She states she is feeling well.     Current Outpatient Medications   Medication Sig Dispense Refill    Semaglutide-Weight Management (WEGOVY) 1.7 MG/0.75ML SOAJ SC injection Inject 1.7 mg into the skin every 7 days 3 mL 0    triamcinolone (KENALOG) 0.1 % cream Apply to affected areas of the skin twice daily for up to 2 weeks or until improved. 80 g 2    levothyroxine (SYNTHROID) 50 MCG tablet TAKE 1 TABLET BY MOUTH ONCE DAILY ON AN EMPTY STOMACH 30 MINUTES BEFORE BREAKFAST 90 tablet 1    ondansetron (ZOFRAN) 4 MG tablet Take 1 tablet by mouth 3 times daily as needed for Nausea or Vomiting 30 tablet 0     No current facility-administered medications for this visit.       Patient's past medical history, surgical history, family history, medications,and allergies  were all reviewed and updated as appropriate today.      Review of Systems   Constitutional: Negative.    HENT: Negative.     Respiratory: Negative.     Cardiovascular: Negative.    Gastrointestinal: Negative.    Musculoskeletal:  Negative.    Skin: Negative.    Neurological: Negative.  Negative for dizziness and headaches.   Psychiatric/Behavioral: Negative.         Physical Exam  Vitals reviewed.   Cardiovascular:      Rate and Rhythm: Normal rate and regular rhythm.      Heart sounds: Normal heart sounds.   Pulmonary:      Breath sounds: Normal breath sounds.   Skin:     General: Skin is warm and dry.   Neurological:      Mental Status: She is alert and oriented to person, place, and time.   Psychiatric:         Mood and Affect: Mood normal.         Behavior: Behavior normal.     Vitals:    08/06/21 1004   BP: 110/60   Pulse: 76   SpO2: 97%       This chart was generated using the Dragon dictation system.  I created this record but it may contain dictation errors due to the limitation of the software.

## 2021-09-06 ENCOUNTER — Ambulatory Visit: Admit: 2021-09-06 | Payer: PRIVATE HEALTH INSURANCE | Attending: Registered Nurse | Primary: Registered Nurse

## 2021-09-06 DIAGNOSIS — E6609 Other obesity due to excess calories: Secondary | ICD-10-CM

## 2021-09-06 DIAGNOSIS — Z6836 Body mass index (BMI) 36.0-36.9, adult: Secondary | ICD-10-CM | POA: Diagnosis not present

## 2021-09-06 DIAGNOSIS — L720 Epidermal cyst: Secondary | ICD-10-CM | POA: Diagnosis not present

## 2021-09-06 MED ORDER — WEGOVY 2.4 MG/0.75ML SC SOAJ
2.4 MG/0.75ML | SUBCUTANEOUS | 2 refills | Status: AC
Start: 2021-09-06 — End: ?

## 2021-09-06 NOTE — Progress Notes (Signed)
Patient: Jennifer Mitchell is a 36 y.o. female who presents today with the following Chief Complaint(s):  Chief Complaint   Patient presents with    1 Month Follow-Up    Medication Check       Assessment:  Encounter Diagnoses   Name Primary?    Class 2 obesity due to excess calories without serious comorbidity with body mass index (BMI) of 36.0 to 36.9 in adult Yes    Epidermoid cyst of skin of scalp        Plan:  1. Class 2 obesity due to excess calories without serious comorbidity with body mass index (BMI) of 36.0 to 36.9 in adult  Dosage increase sent to [facility]. Will follow up in 1 month.   - Semaglutide-Weight Management (WEGOVY) 2.4 MG/0.75ML SOAJ SC injection; Inject 2.4 mg into the skin every 7 days  Dispense: 3 mL; Refill: 2    2. Epidermoid cyst of skin of scalp  Continue Bactroban ointment and apply warm moist compresses. Referral given to general surgery.   - [place] - Lenard Forth, MD, General Surgery, [place]      HPI  Patient presents today to follow up on weight loss. She has been taking the wegovy and it up to the 1.7 mg dosage once weekly. She states she has noticed some fatigue with the 1.7 dosage but she states it is usually in the first couple of days and then that improves. She states she is ready to go up to the next dosage.   She has currently lost 34 pounds since May.     She does have a sore cyst on her scalp. She states she just recently noticed it. She did put Bactroban ointment on it yesterday but has not noticed any improvement.       Current Outpatient Medications   Medication Sig Dispense Refill    Semaglutide-Weight Management (WEGOVY) 2.4 MG/0.75ML SOAJ SC injection Inject 2.4 mg into the skin every 7 days 3 mL 2    triamcinolone (KENALOG) 0.1 % cream Apply to affected areas of the skin twice daily for up to 2 weeks or until improved. 80 g 2    levothyroxine (SYNTHROID) 50 MCG tablet TAKE 1 TABLET BY MOUTH ONCE DAILY ON AN EMPTY STOMACH 30 MINUTES BEFORE  BREAKFAST 90 tablet 1    ondansetron (ZOFRAN) 4 MG tablet Take 1 tablet by mouth 3 times daily as needed for Nausea or Vomiting 30 tablet 0     No current facility-administered medications for this visit.       Patient's past medical history, surgical history, family history, medications,and allergies  were all reviewed and updated as appropriate today.      Review of Systems   Constitutional: Negative.    HENT: Negative.     Respiratory: Negative.     Cardiovascular: Negative.    Gastrointestinal: Negative.    Musculoskeletal: Negative.    Skin:         Cyst of scalp   Neurological: Negative.    Psychiatric/Behavioral: Negative.         Physical Exam  Vitals reviewed.   Cardiovascular:      Rate and Rhythm: Normal rate and regular rhythm.      Heart sounds: Normal heart sounds.   Pulmonary:      Effort: Pulmonary effort is normal.      Breath sounds: Normal breath sounds.   Skin:     General: Skin is warm and dry.   Neurological:  Mental Status: She is alert and oriented to person, place, and time.     Vitals:    09/06/21 0844   BP: 116/68   Pulse: 85   SpO2: 97%       This chart was generated using the Dragon dictation system.  I created this record but it may contain dictation errors due to the limitation of the software.

## 2021-09-07 MED ORDER — DOXYCYCLINE HYCLATE 100 MG PO TABS
100 MG | ORAL_TABLET | Freq: Two times a day (BID) | ORAL | 0 refills | Status: AC
Start: 2021-09-07 — End: 2021-09-14

## 2021-09-07 NOTE — Telephone Encounter (Signed)
Please advise

## 2021-09-07 NOTE — Telephone Encounter (Signed)
From: Kern Reap  To: Mellody Life  Sent: 09/07/2021 10:17 AM EDT  Subject: Cyst    Hey! Thank you for the referral yesterday to the general surgeon. I am set up for their next open consultation appointment on Monday. However the pain has intensified even with using the bactraban topically. My husband said the redness has grown to about inch around the surrounding area of the cyst. I really don't want to go to urgent care especially since I have an appointment on Monday. Is there anything else I can do between now and then to keep it at bay?

## 2021-09-13 ENCOUNTER — Institutional Professional Consult (permissible substitution)
Admit: 2021-09-13 | Discharge: 2021-09-13 | Payer: PRIVATE HEALTH INSURANCE | Attending: Surgery | Primary: Registered Nurse

## 2021-09-13 DIAGNOSIS — L723 Sebaceous cyst: Secondary | ICD-10-CM

## 2021-09-13 NOTE — Progress Notes (Signed)
New Patient Consult    [facility] General Surgery Lavetta Nielsen, MD    [address], [address]  [place], [place] [address]  [phone]    Kern Reap   Date of Birth:  1985/12/26    Date of Visit:  09/13/2021    Rodney Langton, APRN - CNP    Chief Complaint: Scalp lump    HPI: Patient presents for evaluation of a lump on her scalp.  She has a history of numerous previous scalp cyst that she has had removed.  She has a number of them currently.  1 recently became infected.  She was treated with antibiotics.  She states it feels much better now    Allergies   Allergen Reactions    Latex Rash    Soy Allergy      Causes diarrhea    Tilactase     Azithromycin Hives    Clindamycin/Lincomycin Nausea And Vomiting     Outpatient Medications Marked as Taking for the 09/13/21 encounter (Initial consult) with Nance Pear, MD   Medication Sig Dispense Refill    Semaglutide-Weight Management (WEGOVY) 2.4 MG/0.75ML SOAJ SC injection Inject 2.4 mg into the skin every 7 days 3 mL 2    triamcinolone (KENALOG) 0.1 % cream Apply to affected areas of the skin twice daily for up to 2 weeks or until improved. 80 g 2    levothyroxine (SYNTHROID) 50 MCG tablet TAKE 1 TABLET BY MOUTH ONCE DAILY ON AN EMPTY STOMACH 30 MINUTES BEFORE BREAKFAST 90 tablet 1    ondansetron (ZOFRAN) 4 MG tablet Take 1 tablet by mouth 3 times daily as needed for Nausea or Vomiting 30 tablet 0       Past Medical History:   Diagnosis Date    Hypothyroidism     IBS (irritable bowel syndrome)      Past Surgical History:   Procedure Laterality Date    CYST REMOVAL       Family History   Problem Relation Age of Onset    Hypothyroidism Mother     Other Mother         colon polyps    High Cholesterol Mother     High Blood Pressure Father     Alzheimer's Disease Father     Prostate Cancer Father     High Cholesterol Brother     High Blood Pressure Brother     Ovarian Cancer Maternal Grandmother     Heart Disease Paternal  Grandfather      Social History     Socioeconomic History    Marital status: Married     Spouse name: Not on file    Number of children: Not on file    Years of education: Not on file    Highest education level: Not on file   Occupational History    Not on file   Tobacco Use    Smoking status: Never    Smokeless tobacco: Never   Vaping Use    Vaping Use: Never used   Substance and Sexual Activity    Alcohol use: Yes     Comment: socially    Drug use: Never    Sexual activity: Yes     Birth control/protection: Condom   Other Topics Concern    Not on file   Social History Narrative    Not on file     Social Determinants of Health     Financial Resource Strain:  Not on file   Food Insecurity: Not on file   Transportation Needs: Not on file   Physical Activity: Not on file   Stress: Not on file   Social Connections: Not on file   Intimate Partner Violence: Not on file   Housing Stability: Not on file          Vitals:    09/13/21 0915   BP: 118/78   Site: Left Upper Arm   Position: Sitting   Cuff Size: Large Adult   Weight: 208 lb (94.3 kg)   Height: 5\' 3"  (1.6 m)     Body mass index is 36.85 kg/m.     Wt Readings from Last 3 Encounters:   09/13/21 208 lb (94.3 kg)   09/06/21 208 lb 9.6 oz (94.6 kg)   08/06/21 216 lb 12.8 oz (98.3 kg)     BP Readings from Last 3 Encounters:   09/13/21 118/78   09/06/21 116/68   08/06/21 110/60        REVIEW OF SYSTEMS:  CONSTITUTIONAL:  negative  HEENT:  Negative  RESPIRATORY:  negative  CARDIOVASCULAR:  negative  GASTROINTESTINAL:  negative  GENITOURINARY:  negative  HEMATOLOGIC/LYMPHATIC:  negative  ENDOCRINE:  Negative  NEUROLOGICAL:  Negative  * All other ROS reviewed and negative.     PE:  Constitutional:  Well developed, well nourished, no acute distress, non-toxic appearance   Eyes:  PERRL, conjunctiva normal   HENT:  Atraumatic, external ears normal, nose normal. Neck- normal range of motion, no tenderness, supple   Respiratory:  No respiratory distress, normal breath sounds, no  rales, no wheezing   Cardiovascular:  Normal rate, normal rhythm  Integument: On her scalp she has numerous cysts.  1 of these is ulcerated superficially but no significant sign of infection or drainage.  The 3 on her occiput are the ones that bother her the most including the infected 1  Lymphatic:  No lymphadenopathy noted   Neurologic:  Alert & oriented x 3, no focal deficits noted   Psychiatric:  Speech and behavior appropriate       DATA:  N/A      Assessment:  1. Sebaceous cyst        Plan: Excision of sebaceous cyst x3 under local anesthesia. I explained the procedure including risks, benefits, and alternatives. Questions were answered and the patient agrees to proceed.

## 2021-09-21 ENCOUNTER — Inpatient Hospital Stay
Admit: 2021-09-21 | Discharge: 2021-09-21 | Payer: PRIVATE HEALTH INSURANCE | Attending: Surgery | Primary: Registered Nurse

## 2021-09-21 DIAGNOSIS — L723 Sebaceous cyst: Secondary | ICD-10-CM

## 2021-09-21 DIAGNOSIS — R208 Other disturbances of skin sensation: Secondary | ICD-10-CM | POA: Diagnosis not present

## 2021-09-21 MED ORDER — LIDOCAINE-EPINEPHRINE 1 %-1:100000 IJ SOLN
1 %-:00000 | INTRAMUSCULAR | Status: AC
Start: 2021-09-21 — End: ?

## 2021-09-21 MED FILL — XYLOCAINE/EPINEPHRINE 1 %-1:100000 IJ SOLN: 1 %-:00000 | INTRAMUSCULAR | Qty: 20

## 2021-09-21 NOTE — Discharge Instructions (Signed)
[facility]    David T. Ward, M.D.   [place] [facility]      [place] [facility]        [place]               [address]                [address]  Jeff Welshhans, M.D.              [address]          [place] [address]         [place] [address]  Erin Uecker M. Myanna Ziesmer, M.D                         [phone]         [phone]        [place]        [place]                   Mark T. Poynter, M.D.          [place]       POST-OPERATIVE INSTRUCTIONS FOLLOWING EXCISION OF A MASS    Call the office to schedule your post-operative appointment with your surgeon for two (2) weeks.     The incision is closed with stitches in the skin    You may remove the dressing tomorrow and shower.  Wash incision gently, and pat dry. Do not rub your incision. Apply antibiotic ointment daily to the incision    Watch for signs of infection:       Fever over 101.5     Excessive warmth, or bright redness around your incision    Leakage of cloudy fluid from you incisions

## 2021-09-21 NOTE — Progress Notes (Signed)
Spoke with patient aware of arrival time for local procedure.Idamae Lusher, RN

## 2021-09-21 NOTE — Progress Notes (Signed)
Pt discharged to home in stable condition.  Verbal and written discharge instructions given pt verbalized understanding.  Pt ambulatory to car

## 2021-09-21 NOTE — Op Note (Signed)
[facility]                 [address] [place], [place] [address]                                OPERATIVE REPORT    PATIENT NAME: Jennifer, Mitchell                     DOB:        October 13, 1985  MED REC NO:   [id number]                          ROOM:  ACCOUNT NO:   [id number]                           ADMIT DATE: 09/21/2021  PROVIDER:     Orion Crook. Keiana Tavella, MD    DATE OF PROCEDURE:  09/21/2021    PREOPERATIVE DIAGNOSIS:  Scalp cyst x3.    POSTOPERATIVE DIAGNOSIS:  Scalp cyst x3.    PROCEDURE:  Excision of scalp cyst x3.    ANESTHESIA:  Local.    SURGEON:  Orion Crook. Umberto Pavek, MD.    ESTIMATED BLOOD LOSS:  Minimal.    INDICATIONS:  The patient is a 36 year old woman with history of  multiple scalp cysts.  She has three that are really bothering her, and  she is brought in for excision.    OPERATIVE SUMMARY:  After preoperative evaluation, the patient was  brought in the operating suite and placed in a comfortable supine  position on the stretcher.  Her scalp was sterilely prepped and draped  and anesthetized with local anesthetic.  She had 3 cysts that were  bothering her.  These measured 12, 13, and 14 mm.  Each was excised via  a 5 x 15 mm ellipse of skin.  Bleeding was controlled with pressure and  the incisions were closed with interrupted simple sutures of 3-0 nylon  and the skin.  Antibiotic ointment was applied.  All sponge, needle, and  instrument counts were correct at the end of the case.  The patient  tolerated the procedure well.  She was discharged in good  condition. Marland Kitchen        Gareth Morgan, MD    D: 09/21/2021 15:09:48       T: 09/21/2021 15:14:11     [id number]  Job#: [id number]     Doc#: [id number]    CC:  Mellody Life, CNP

## 2021-10-04 ENCOUNTER — Ambulatory Visit: Admit: 2021-10-04 | Payer: PRIVATE HEALTH INSURANCE | Attending: Registered Nurse | Primary: Registered Nurse

## 2021-10-04 DIAGNOSIS — Z6835 Body mass index (BMI) 35.0-35.9, adult: Secondary | ICD-10-CM

## 2021-10-04 DIAGNOSIS — E6609 Other obesity due to excess calories: Secondary | ICD-10-CM | POA: Diagnosis not present

## 2021-10-04 DIAGNOSIS — R112 Nausea with vomiting, unspecified: Secondary | ICD-10-CM | POA: Diagnosis not present

## 2021-10-04 MED ORDER — ONDANSETRON HCL 4 MG PO TABS
4 MG | ORAL_TABLET | Freq: Three times a day (TID) | ORAL | 3 refills | Status: DC | PRN
Start: 2021-10-04 — End: 2023-07-14

## 2021-10-04 NOTE — Progress Notes (Signed)
Patient: Jennifer Mitchell is a 36 y.o. female who presents today with the following Chief Complaint(s):  Chief Complaint   Patient presents with    1 Month Follow-Up    Medication Check       Assessment:  Encounter Diagnoses   Name Primary?    Class 2 obesity due to excess calories without serious comorbidity with body mass index (BMI) of 35.0 to 35.9 in adult Yes    Nausea and vomiting, unspecified vomiting type        Plan:  1. Class 2 obesity due to excess calories without serious comorbidity with body mass index (BMI) of 35.0 to 35.9 in adult  [facility] refill sent in to [facility] compound pharmacy. Patient will let me know in a month is side effects have improved or if she wants to go back down to the 1.7.     2. Nausea and vomiting, unspecified vomiting type  Related to the medication wegovy. Sent in refill of zofran as needed.   - ondansetron (ZOFRAN) 4 MG tablet; Take 1 tablet by mouth 3 times daily as needed for Nausea or Vomiting  Dispense: 30 tablet; Refill: 3      HPI  Patient presents today for follow up on weight. She has been on the compound semaglutide (wegovy) for weight loss. She was increased to the highest 2.4 dosage this past month. She states she has noticed some side effects from it. She has had a lot of nausea and episodes of vomiting. She has also noticed at injection sight she is getting hives. She denies any rash anywhere else. She states she was taking zyrtec twice a day and she stopped that recently. She also has had some joint pains recently. She states she has continued to loss about 10 pounds each month. She is happy with her weight loss. She would like to stay the course and try 1 more month at the 2.4 and if still having significant side effects she would like to try going back down to the 1.7 dosage.     Current Outpatient Medications   Medication Sig Dispense Refill    ondansetron (ZOFRAN) 4 MG tablet Take 1 tablet by mouth 3 times daily as needed for Nausea or Vomiting 30 tablet 3     Semaglutide-Weight Management (WEGOVY) 2.4 MG/0.75ML SOAJ SC injection Inject 2.4 mg into the skin every 7 days 3 mL 2    triamcinolone (KENALOG) 0.1 % cream Apply to affected areas of the skin twice daily for up to 2 weeks or until improved. 80 g 2    levothyroxine (SYNTHROID) 50 MCG tablet TAKE 1 TABLET BY MOUTH ONCE DAILY ON AN EMPTY STOMACH 30 MINUTES BEFORE BREAKFAST 90 tablet 1     No current facility-administered medications for this visit.       Patient's past medical history, surgical history, family history, medications,and allergies  were all reviewed and updated as appropriate today.      Review of Systems   Constitutional: Negative.    HENT: Negative.     Respiratory: Negative.  Negative for cough and shortness of breath.    Cardiovascular: Negative.    Gastrointestinal:  Positive for nausea and vomiting.   Musculoskeletal: Negative.    Skin: Negative.    Neurological: Negative.  Negative for dizziness and headaches.   Psychiatric/Behavioral: Negative.           Physical Exam  Vitals reviewed.   Cardiovascular:      Rate and Rhythm: Normal rate and  regular rhythm.      Heart sounds: Normal heart sounds.   Pulmonary:      Effort: Pulmonary effort is normal.      Breath sounds: Normal breath sounds.   Skin:     General: Skin is warm and dry.   Neurological:      Mental Status: She is alert and oriented to person, place, and time.       Vitals:    10/04/21 0842   BP: 110/62   Pulse: 79   SpO2: 98%       This chart was generated using the Dragon dictation system.  I created this record but it may contain dictation errors due to the limitation of the software.

## 2021-10-14 NOTE — Telephone Encounter (Signed)
Error

## 2021-10-30 ENCOUNTER — Encounter

## 2021-11-01 MED ORDER — WEGOVY 2.4 MG/0.75ML SC SOAJ
2.4 MG/0.75ML | SUBCUTANEOUS | 2 refills | Status: AC
Start: 2021-11-01 — End: 2022-01-18

## 2021-11-01 NOTE — Addendum Note (Signed)
Addended by: Armandina Stammer on: 11/01/2021 11:22 AM     Modules accepted: Orders

## 2021-11-01 NOTE — Telephone Encounter (Signed)
From: Cherre Robins  To: Armandina Stammer  Sent: 10/30/2021 12:07 PM EDT  Subject: OQHUTM appointment     It looks like my appointment that was supposed to be scheduled for this week got scheduled for December for some reason. It was a follow up for the 2.5mg  of Wegovy. I'm not having any issues with this dosage and am no longer having the hives. Is there any way we can go ahead with the 3 month refill and i can keep the December appointment to follow up? It costs at least $100 per visit there due to my high deductible plan, and after having the surgery on my head our medical bills are really high right now. I'd rather not add another on top of the cost of the [facility] if possible? I already Have to set up a payment plan for over $900 I owe [place] :/    Thanks

## 2021-12-28 ENCOUNTER — Ambulatory Visit: Admit: 2021-12-28 | Payer: PRIVATE HEALTH INSURANCE | Attending: Registered Nurse | Primary: Registered Nurse

## 2021-12-28 DIAGNOSIS — Z6833 Body mass index (BMI) 33.0-33.9, adult: Secondary | ICD-10-CM

## 2021-12-28 DIAGNOSIS — E6609 Other obesity due to excess calories: Secondary | ICD-10-CM | POA: Diagnosis not present

## 2021-12-28 DIAGNOSIS — G43909 Migraine, unspecified, not intractable, without status migrainosus: Secondary | ICD-10-CM | POA: Diagnosis not present

## 2021-12-28 MED ORDER — FROVATRIPTAN SUCCINATE 2.5 MG PO TABS
2.5 MG | ORAL_TABLET | Freq: Every day | ORAL | 2 refills | Status: DC | PRN
Start: 2021-12-28 — End: 2022-11-26

## 2021-12-28 NOTE — Progress Notes (Signed)
Patient: Jennifer Mitchell is a 36 y.o. female who presents today with the following Chief Complaint(s):  Chief Complaint   Patient presents with    Medication Check     wegovy       Assessment:  Encounter Diagnoses   Name Primary?    Class 1 obesity due to excess calories with serious comorbidity and body mass index (BMI) of 33.0 to 33.9 in adult Yes    Migraine without status migrainosus, not intractable, unspecified migraine type        Plan:  1. Class 1 obesity due to excess calories with serious comorbidity and body mass index (BMI) of 33.0 to 33.9 in adult  Continue wegovy at 2.4 mg weekly. Follow up in 3 months.     2. Migraine without status migrainosus, not intractable, unspecified migraine type  Start frovatriptan. Take only as needed. Follow up in 3 months.   - frovatriptan succinate (FROVA) 2.5 MG TABS; Take 1 tablet by mouth daily as needed for Migraine May repeat dose in 2 hours if needed. Do not exceed 2  doses in 24 hours  Dispense: 9 tablet; Refill: 2      HPI  Patient presents today for follow up on weight loss. She has continued to lose but a slower rate. She denies any side effects. She is starting new insurance in January and states her insurance will cover the wegovy then.  She also has a history of migraines. She states she was on Frovatriptan in the past. She states it is the only triptan that worked for her. She has been noticing more frequent migraines. She states she had one last week that lasted about 5 days. She states excedrine is not helpful.     Current Outpatient Medications   Medication Sig Dispense Refill    frovatriptan succinate (FROVA) 2.5 MG TABS Take 1 tablet by mouth daily as needed for Migraine May repeat dose in 2 hours if needed. Do not exceed 2  doses in 24 hours 9 tablet 2    Semaglutide-Weight Management (WEGOVY) 2.4 MG/0.75ML SOAJ SC injection Inject 2.4 mg into the skin every 7 days 3 mL 2    ondansetron (ZOFRAN) 4 MG tablet Take 1 tablet by mouth 3 times daily as needed  for Nausea or Vomiting 30 tablet 3    triamcinolone (KENALOG) 0.1 % cream Apply to affected areas of the skin twice daily for up to 2 weeks or until improved. 80 g 2    levothyroxine (SYNTHROID) 50 MCG tablet TAKE 1 TABLET BY MOUTH ONCE DAILY ON AN EMPTY STOMACH 30 MINUTES BEFORE BREAKFAST 90 tablet 1     No current facility-administered medications for this visit.       Patient's past medical history, surgical history, family history, medications,and allergies  were all reviewed and updated as appropriate today.      Review of Systems   Constitutional: Negative.    HENT: Negative.     Respiratory: Negative.     Cardiovascular: Negative.    Gastrointestinal: Negative.    Musculoskeletal: Negative.    Neurological: Negative.    Psychiatric/Behavioral: Negative.           Physical Exam  Vitals reviewed.   Cardiovascular:      Rate and Rhythm: Normal rate and regular rhythm.      Heart sounds: Normal heart sounds.   Pulmonary:      Effort: Pulmonary effort is normal.      Breath sounds: Normal breath sounds.  Skin:     General: Skin is warm and dry.   Neurological:      Mental Status: She is alert and oriented to person, place, and time.   Psychiatric:         Mood and Affect: Mood normal.         Behavior: Behavior normal.       Vitals:    12/28/21 0817   BP: 122/70   Pulse: 74   SpO2: 98%       This chart was generated using the Dragon dictation system.  I created this record but it may contain dictation errors due to the limitation of the software.

## 2021-12-29 DIAGNOSIS — R102 Pelvic and perineal pain: Secondary | ICD-10-CM | POA: Diagnosis not present

## 2021-12-29 NOTE — Progress Notes (Signed)
Formatting of this note is different from the original.    Subjective:      Patient is a 36 y.o. [id number]  seen for evaluation of chronic pelvic pain. Onset of symptoms was gradual 6 days ago with constant course since that time. The pain occurs all throughout the month and with intercourse. It is located in the RLQ, lower back and radiates into legs and lasts several days. She describes the pain as sharp, constant and shooting. Symptoms improve with none. Her previous work-up includes None. Her previous treatment includes no treatment. She has no history of PID, STD's. She does not desire further childbearing.    Patient had her period on 11/28. It was heavy and associated with a 4 day migraine. She had intercourse on 12/8 and 12/10 and had deep pain and pressure. She starting with significant RLQ pain yesterday.   The previous menstrual cycle she had 2 episodes of having a very full bladder and then not being able to urinate without first removing her tampon.   Her periods in general are very heavy for 2 days and then linger for several days. She notes a grey discharge with a fishy odor after her period most months.     Patient Active Problem List    Diagnosis Date Noted   ? Chronic pelvic pain in female 08/07/2018   ? Anxiety disorder 08/07/2018   ? History of gallstones 08/07/2018   ? Postpartum hemorrhage 08/07/2018   ? Third degree perineal laceration 08/07/2018   ? History of removal of cyst 03/23/2018   ? History of uterine fibroid 03/23/2018   ? Hypothyroidism 03/23/2018   ? Irritable bowel syndrome with both constipation and diarrhea 03/23/2018   ? Heart palpitations 02/15/2016   ? 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 Chronic tension-type headache, not intractable 10/14/2014   ? Intractable migraine without aura and without status migrainosus 10/14/2014   ? Dermoid cyst of scalp 03/27/2014   ? Sprain of tarsometatarsal joint  of left foot 03/27/2014   ? Anemia 07/06/2013   ? Hyperlipidemia 07/04/2013   ? Metrorrhagia 10/31/2008   ? Urge incontinence 06/21/2004   ? Asthma 06/18/2004     Past Medical History:   Diagnosis Date   ? Anemia    ? Anxiety    ? Hemorrhage after delivery of fetus     with 2nd baby- required transfusion   ? Irritable bowel syndrome    ? Lipid screening 2014    nml   ? Migraine    ? Pap smear for cervical cancer screening 09/15/2017    neg, neg   ? Screening mammogram for breast cancer 12/18/2019    negative     Past Surgical History:   Procedure Laterality Date   ? HX WISDOM TOOTH EXTRACTION     ? PR TRANSFUSION BLOOD/BLOOD COMPONENTS       (Not in a hospital admission)    Allergies   Allergen Reactions   ? Clindamycin Nausea And Vomiting   ? Dairy Aid [Lactase]    ? Latex    ? Azithromycin Hives     Social History     Occupational History   ? Not on file   Tobacco Use   ? Smoking status: Never   ? Smokeless tobacco: Never   Substance and Sexual Activity   ? Alcohol use: No   ? Drug use: No   ? Sexual  activity: Yes     Partners: Male     Birth control/protection: Condom     Family History   Problem Relation Name Age of Onset   ? High Cholesterol Father          alzheimers dec 62   ? Ovarian Cancer Maternal Grandmother          age 20 dec 47   ? Lung Cancer Maternal Grandfather          smoker   ? Heart Disease Paternal Grandmother     ? Breast Cancer Other MGaunt         late 83s   ? Breast Cancer Other MGaunt         58       Review of Systems  Negative except for HPI    Objective:     BP 102/64   Ht 5\' 2"  (1.575 m)   Wt 192 lb (87.1 kg)   LMP 12/14/2021 (Exact Date)   BMI 35.12 kg/m   General:   alert, cooperative, no distress, appears stated ageappearance:16600::"alert","cooperative","no distress","appears stated age"}   Abdomen: soft, nondistended   Pelvic:     Vulva: normal    Vagina:  normal mucosa    Cervix: no lesions    Uterus: normal size    Adnexa: Right adnexa full and tender to palpation      Assessment:     RLQ pain  Suspected ovarian cyst    Plan:     Will check ultrasound    Jennifer Mitchell. 12/16/2021, MD  Electronically signed by Linus Orn., MD at 12/29/2021  3:49 PM EST

## 2021-12-30 DIAGNOSIS — R102 Pelvic and perineal pain: Secondary | ICD-10-CM | POA: Diagnosis not present

## 2021-12-30 NOTE — Progress Notes (Signed)
Formatting of this note might be different from the original.  Pt was seen today results can be seen on [place] software  Electronically signed by Deanna Artis, RDMS at 12/30/2021  8:58 AM EST

## 2022-01-04 ENCOUNTER — Inpatient Hospital Stay
Admit: 2022-01-04 | Discharge: 2022-01-04 | Disposition: A | Discharge status: Home / Self Care | Payer: PRIVATE HEALTH INSURANCE | Attending: Emergency Medicine

## 2022-01-04 DIAGNOSIS — L237 Allergic contact dermatitis due to plants, except food: Secondary | ICD-10-CM

## 2022-01-04 DIAGNOSIS — L509 Urticaria, unspecified: Secondary | ICD-10-CM

## 2022-01-04 MED ORDER — PREDNISONE 10 MG PO TABS
10 MG | ORAL_TABLET | ORAL | 0 refills | Status: AC
Start: 2022-01-04 — End: 2022-01-16

## 2022-01-04 MED ORDER — HYDROXYZINE HCL 25 MG PO TABS
25 MG | ORAL_TABLET | Freq: Three times a day (TID) | ORAL | 0 refills | Status: AC | PRN
Start: 2022-01-04 — End: 2022-01-14

## 2022-01-04 NOTE — ED Provider Notes (Signed)
EMERGENCY DEPARTMENT [facility]  ED     Pt Name: Jennifer Mitchell   MRN: [id number]   Birthdate 1985/01/22   Date of evaluation: 01/04/2022   Provider: Vashti Hey, DO   PCP: Mellody Life, APRN - CNP   Note Started: 10:34 PM EST 01/04/22     CHIEF COMPLAINT     Chief Complaint   Patient presents with    Wound Check     Patient to the ED for wound check and possible infection on multiple parts of the body. Started on abx, and prednisone and not getting any better. Reports really bad itching, and last night the area were really painful on "the inside". Patient reports this started a few weeks ago.           HISTORY OF PRESENT ILLNESS:  History from : Patient   Limitations to history : None     Jennifer Mitchell is a 36 y.o. female who presents to the emergency department for reported wound check.  Patient was recently exposed to poison ivy.  She was started on a 5-day course of prednisone and while this initially improved her symptoms he has had recurrence with worsening specifically along bilateral flanks.  Patient was started on Bactrim and doxycycline within the last 24 hours.  Rash is quite pruritic.  She denies fevers, chills, or sweats.    Nursing Notes were all reviewed and agreed with or any disagreements were addressed in the HPI.     ROS: Positives and Pertinent negatives as per HPI.    PAST MEDICAL HISTORY     PHYSICAL EXAM:  ED Triage Vitals [01/04/22 1603]   BP Temp Temp Source Pulse Respirations SpO2 Height Weight - Scale   121/82 98.4 F (36.9 C) Oral 83 18 98 % 1.6 m (5\' 3" ) 86.2 kg (190 lb)        Physical Exam     General: No acute distress.  Moderate discomfort.  Head: Normocephalic/atraumatic.  Heart: Regular in rhythm.  Normal S1-S2.  Lungs: Clear to auscultation bilaterally.  No wheezes, rales, rhonchi.  Abdomen: Soft.  Nontender.  Nondistended.  No rebound, or guarding.  Extremities: Atraumatic.  Skin: Multiple lesions on hand and forearm which likely  represent poison ivy or similar.  Patient also has 2 large patches of urticaria on bilateral flanks.  This does not appear to be cellulitic in nature and is blanchable.    DIAGNOSTIC RESULTS   LABS:   Labs Reviewed - No data to display   When ordered only abnormal lab results are displayed. All other labs were within normal range or not returned as of this dictation.        EMERGENCY DEPARTMENT COURSE and DIFFERENTIAL DIAGNOSIS/MDM:     Vitals:    Vitals:    01/04/22 1603   BP: 121/82   Pulse: 83   Resp: 18   Temp: 98.4 F (36.9 C)   TempSrc: Oral   SpO2: 98%   Weight: 86.2 kg (190 lb)   Height: 1.6 m (5\' 3" )        Patient was given the following medications:   Medications - No data to display          CC/HPI Summary, DDx, ED Course, and Reassessment:   As noted above, patient presents to the emergency department for evaluation of rash.  Recent exposure to poison ivy with 5-day treatment of steroids.  Rash transiently improved and then returned.  She was subsequently started on doxycycline and Bactrim without significant improvement of rash.  On exam, patient's vitals are stable.  Oropharynx within normal limits and lungs are clear to auscultation.  Skin reveals multiple lesions across the forearms that are concerning for resolving urticaria/potential poison ivy.  However, there are large patches of urticaria on bilateral flanks.  Low suspicion for cellulitic change at this time.  Patient will be started on a long steroid taper and Atarax.    CONSULTS: None   Social Determinants: None   Chronic Conditions: NA    Disposition Considerations: None      I am the primary physician of Record.     FINAL IMPRESSION    1. Urticaria    2. Poison ivy         DISPOSITION/PLAN   DISPOSITION Decision To Discharge 01/04/2022 04:49:30 PM       PATIENT REFERRED TO:   Armandina Stammer, APRN - CNP  [address]  [place] [address]  [phone]    In 3 days      [facility]  ED  [address]  [place]  [address]  [phone]    If symptoms worsen     DISCHARGE MEDICATIONS:   Discharge Medication List as of 01/04/2022  5:22 PM        START taking these medications    Details   hydrOXYzine HCl (ATARAX) 25 MG tablet Take 1 tablet by mouth every 8 hours as needed for Itching, Disp-30 tablet, R-0Normal      predniSONE (DELTASONE) 10 MG tablet Take 4 tablets by mouth daily for 3 days, THEN 3 tablets daily for 3 days, THEN 2 tablets daily for 3 days, THEN 1 tablet daily for 3 days., Disp-30 tablet, R-0Normal            DISCONTINUED MEDICATIONS:   Discharge Medication List as of 01/04/2022  5:22 PM               (Please note that portions of this note were completed with a voice recognition program.  Efforts were made to edit the dictations but occasionally words are mis-transcribed.)     Ellwood Dense, DO (electronically signed)        Kaylyn Lim II, DO  01/04/22 2237

## 2022-01-04 NOTE — ED Notes (Signed)
Discharge instructions explained by ED provider. Patient verbalized understanding and denies any other concerns or complaints at this time. Patient vital signs stable and no acute signs or symptoms of distress noted at discharge. Patient deemed clinically stable. Patient d/c home.

## 2022-01-18 ENCOUNTER — Encounter

## 2022-01-18 MED ORDER — WEGOVY 2.4 MG/0.75ML SC SOAJ
2.40.75 MG/0.75ML | SUBCUTANEOUS | 3 refills | Status: DC
Start: 2022-01-18 — End: 2022-05-08

## 2022-01-18 NOTE — Telephone Encounter (Signed)
Last Office Visit  -  12/28/21  Next Office Visit  -  03/29/22    Last Filled  -    Last UDS -   Contract -      Please advise if possible

## 2022-01-18 NOTE — Telephone Encounter (Signed)
Please advise for PA

## 2022-01-18 NOTE — Telephone Encounter (Signed)
Submitted PA for Wegovy 2.4MG /0.75ML auto-injectors  Via CMM Key: [id number] STATUS: PENDING.    Follow up done daily; if no decision with in three days we will refax.  If another three days goes by with no decision will call the insurance for status.

## 2022-01-18 NOTE — Telephone Encounter (Signed)
From: Cherre Robins  To: Armandina Stammer  Sent: 01/18/2022 11:22 AM EST  Subject: RXVQMG    Can we try sending the name brand Wegovy prescription the 2.4mg  dose to the [facility] now that it's the new year since my new insurance kicked in?

## 2022-01-18 NOTE — Telephone Encounter (Signed)
Prescription sent.

## 2022-01-21 NOTE — Telephone Encounter (Signed)
Sent to mychart

## 2022-01-21 NOTE — Telephone Encounter (Signed)
The medication is APPROVED.    If this requires a response please respond to the pool ( P MHCX [facility] MEDICATION PRE-AUTH).      Thank you please advise patient.

## 2022-01-26 DIAGNOSIS — Z01419 Encounter for gynecological examination (general) (routine) without abnormal findings: Secondary | ICD-10-CM | POA: Diagnosis not present

## 2022-01-26 DIAGNOSIS — Z1151 Encounter for screening for human papillomavirus (HPV): Secondary | ICD-10-CM | POA: Diagnosis not present

## 2022-02-01 MED ORDER — LEVOTHYROXINE SODIUM 50 MCG PO TABS
50 MCG | ORAL_TABLET | ORAL | 1 refills | Status: AC
Start: 2022-02-01 — End: 2022-08-15

## 2022-02-01 NOTE — Telephone Encounter (Signed)
Last Office Visit  -  12/28/21  Next Office Visit  -  03/29/22    Last Filled  -  07/19/21  Last UDS -    Contract -

## 2022-02-01 NOTE — Telephone Encounter (Signed)
Pharmacy requesting refill    levothyroxine (SYNTHROID) 50 MCG tablet     [facility] [address] [facility]

## 2022-02-15 NOTE — Telephone Encounter (Signed)
Okay to fill out?

## 2022-02-15 NOTE — Telephone Encounter (Signed)
From: Cherre Robins  To: Armandina Stammer  Sent: 02/15/2022 2:48 PM EST  Subject: [place]    Is there any way you can fill this form out for me? 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 If I need to send this to a different department or do this another way I am happy to. I wasn't sure how this is typically handled. Thanks!

## 2022-02-16 NOTE — Telephone Encounter (Signed)
Forms filled out. Please scan a copy to the chart and let patient know she can stop by to pick up the forms.

## 2022-03-17 NOTE — Telephone Encounter (Signed)
Yes I will them. Can all be scheduled back to back if she prefers to bring them in all at once

## 2022-03-17 NOTE — Telephone Encounter (Signed)
Please advise if okay

## 2022-03-17 NOTE — Telephone Encounter (Signed)
Jennifer Mitchell has 3 children ages 72-11 she is wanting to know if you would see them as new pts,she requested I asked you 1st before scheduling them with another provider please advised

## 2022-03-17 NOTE — Telephone Encounter (Signed)
Left message for pt to call back- children can be scheduled back to back with 40 min appts each

## 2022-03-21 NOTE — Telephone Encounter (Signed)
Pt notified- will schedule appts at pt's next visit

## 2022-03-29 ENCOUNTER — Ambulatory Visit
Admit: 2022-03-29 | Discharge: 2022-03-29 | Payer: PRIVATE HEALTH INSURANCE | Attending: Registered Nurse | Primary: Registered Nurse

## 2022-03-29 DIAGNOSIS — Z6831 Body mass index (BMI) 31.0-31.9, adult: Secondary | ICD-10-CM

## 2022-03-29 DIAGNOSIS — E6609 Other obesity due to excess calories: Secondary | ICD-10-CM | POA: Diagnosis not present

## 2022-03-29 NOTE — Progress Notes (Signed)
Patient: Jennifer Mitchell is a 37 y.o. female who presents today with the following Chief Complaint(s):  Chief Complaint   Patient presents with    3 Month Follow-Up       Assessment:  Encounter Diagnosis   Name Primary?    Class 1 obesity due to excess calories with serious comorbidity and body mass index (BMI) of 31.0 to 31.9 in adult Yes       Plan:  1. Class 1 obesity due to excess calories with serious comorbidity and body mass index (BMI) of 31.0 to 31.9 in adult  Doing well on wegovy. Patient will reach out when she is due to refill and when she wants it sent to the compound pharmacy. Follow up in 3 months.       HPI  Patient presents today for follow up on weight loss. She is down a total of 65 pounds. She states she is tolerating the wegovy well. She has zofran to use when needed. She does report looser stool about 3 days after the wegovy dosage. She states it is tolerable and after that she has normal BM's. She states she will need to consider going back to the compound pharmacy as her insurance will not longer cover the medication after next month.       Current Outpatient Medications   Medication Sig Dispense Refill    levothyroxine (SYNTHROID) 50 MCG tablet TAKE 1 TABLET BY MOUTH ONCE DAILY ON AN EMPTY STOMACH 30 MINUTES BEFORE BREAKFAST 90 tablet 1    Semaglutide-Weight Management (WEGOVY) 2.4 MG/0.75ML SOAJ SC injection Inject 2.4 mg into the skin every 7 days 3 mL 3    frovatriptan succinate (FROVA) 2.5 MG TABS Take 1 tablet by mouth daily as needed for Migraine May repeat dose in 2 hours if needed. Do not exceed 2  doses in 24 hours 9 tablet 2    ondansetron (ZOFRAN) 4 MG tablet Take 1 tablet by mouth 3 times daily as needed for Nausea or Vomiting 30 tablet 3    triamcinolone (KENALOG) 0.1 % cream Apply to affected areas of the skin twice daily for up to 2 weeks or until improved. 80 g 2     No current facility-administered medications for this visit.       Patient's past medical history, surgical  history, family history, medications,and allergies  were all reviewed and updated as appropriate today.      Review of Systems   Constitutional: Negative.    HENT: Negative.     Respiratory: Negative.     Cardiovascular: Negative.    Gastrointestinal: Negative.    Musculoskeletal: Negative.    Skin: Negative.    Neurological: Negative.  Negative for dizziness and headaches.   Psychiatric/Behavioral: Negative.           Physical Exam  Vitals reviewed.   Cardiovascular:      Rate and Rhythm: Normal rate and regular rhythm.      Heart sounds: Normal heart sounds.   Pulmonary:      Effort: Pulmonary effort is normal.      Breath sounds: Normal breath sounds.   Skin:     General: Skin is warm and dry.   Neurological:      Mental Status: She is alert and oriented to person, place, and time.   Psychiatric:         Mood and Affect: Mood normal.         Behavior: Behavior normal.  Vitals:    03/29/22 0830   BP: 116/64   Pulse: 86   SpO2: 98%       This chart was generated using the Dragon dictation system.  I created this record but it may contain dictation errors due to the limitation of the software.

## 2022-05-09 MED ORDER — WEGOVY 2.4 MG/0.75ML SC SOAJ
2.4 | SUBCUTANEOUS | 3 refills | Status: DC
Start: 2022-05-09 — End: 2022-06-07

## 2022-05-09 NOTE — Telephone Encounter (Signed)
Last visit 03/29/22  Future 06/29/22

## 2022-06-07 MED ORDER — WEGOVY 2.4 MG/0.75ML SC SOAJ
2.4 | SUBCUTANEOUS | 1 refills | Status: DC
Start: 2022-06-07 — End: 2022-08-10

## 2022-06-07 NOTE — Telephone Encounter (Signed)
Last Office Visit  -  03/29/22  Next Office Visit  -  06/29/22    Last Filled  -  05/09/22  Last UDS -    Contract -

## 2022-06-07 NOTE — Telephone Encounter (Signed)
From: Jennifer Mitchell  To: Mellody Life  Sent: 06/07/2022 10:50 AM EDT  Subject: OZHYQM compound     Are you able to send in a refill for the compounded version of Wegovy to the local compounding pharmacy on [address]. I will need 1 refill to get me to our follow up appointment

## 2022-06-22 NOTE — Telephone Encounter (Signed)
Come fasting to the appointment and we can do blood work.

## 2022-06-22 NOTE — Telephone Encounter (Signed)
From: Jennifer Mitchell  To: Jennifer Mitchell  Sent: 06/22/2022 10:26 AM EDT  Subject: Appointment next week    Is there any way to add blood work to my visit for next week. I have been extremely tired over the last month and have started bruising very easily. It's to the point where I have bruises that I dont even know where they came from and they are lasting weeks to get rid of. I am Ok to fast if I need to

## 2022-06-22 NOTE — Telephone Encounter (Signed)
Do you want pt to fast?

## 2022-06-29 ENCOUNTER — Encounter

## 2022-06-29 ENCOUNTER — Encounter: Admit: 2022-06-29 | Payer: PRIVATE HEALTH INSURANCE | Attending: Registered Nurse | Primary: Registered Nurse

## 2022-06-29 DIAGNOSIS — Z6829 Body mass index (BMI) 29.0-29.9, adult: Secondary | ICD-10-CM

## 2022-06-29 DIAGNOSIS — E039 Hypothyroidism, unspecified: Secondary | ICD-10-CM | POA: Diagnosis not present

## 2022-06-29 DIAGNOSIS — E785 Hyperlipidemia, unspecified: Secondary | ICD-10-CM | POA: Diagnosis not present

## 2022-06-29 DIAGNOSIS — R5383 Other fatigue: Secondary | ICD-10-CM | POA: Diagnosis not present

## 2022-06-29 DIAGNOSIS — R233 Spontaneous ecchymoses: Secondary | ICD-10-CM | POA: Diagnosis not present

## 2022-06-29 LAB — FERRITIN: Ferritin: 13.7 ng/mL — ABNORMAL LOW (ref 15.0–150.0)

## 2022-06-29 LAB — CBC WITH AUTO DIFFERENTIAL
Basophils %: 0.5 %
Basophils Absolute: 0 10*3/uL (ref 0.0–0.2)
Eosinophils %: 1.2 %
Eosinophils Absolute: 0.1 10*3/uL (ref 0.0–0.6)
Hematocrit: 36.4 % (ref 36.0–48.0)
Hemoglobin: 12.1 g/dL (ref 12.0–16.0)
Lymphocytes %: 31.4 %
Lymphocytes Absolute: 2 10*3/uL (ref 1.0–5.1)
MCH: 26.2 pg (ref 26.0–34.0)
MCHC: 33.1 g/dL (ref 31.0–36.0)
MCV: 79.1 fL — ABNORMAL LOW (ref 80.0–100.0)
MPV: 9 fL (ref 5.0–10.5)
Monocytes %: 6.7 %
Monocytes Absolute: 0.4 10*3/uL (ref 0.0–1.3)
Neutrophils %: 60.2 %
Neutrophils Absolute: 3.8 10*3/uL (ref 1.7–7.7)
Platelets: 312 10*3/uL (ref 135–450)
RBC: 4.61 M/uL (ref 4.00–5.20)
RDW: 14 % (ref 12.4–15.4)
WBC: 6.3 10*3/uL (ref 4.0–11.0)

## 2022-06-29 LAB — VITAMIN B12 & FOLATE
Folate: 9.73 ng/mL (ref 4.78–24.20)
Vitamin B-12: 360 pg/mL (ref 211–911)

## 2022-06-29 LAB — TSH WITH REFLEX TO FT4: TSH Reflex FT4: 1.99 u[IU]/mL (ref 0.27–4.20)

## 2022-06-29 NOTE — Progress Notes (Signed)
Patient: Jennifer Mitchell is a 37 y.o. female who presents today with the following Chief Complaint(s):  Chief Complaint   Patient presents with    3 Month Follow-Up    Blood Work       Assessment:  Encounter Diagnoses   Name Primary?    BMI 29.0-29.9,adult Yes    Bruises easily     Hypothyroidism, unspecified type     Hyperlipidemia, unspecified hyperlipidemia type     Fatigue, unspecified type        Plan:  1. BMI 29.0-29.9,adult  Doing well on Wegovy.  Will continue with a dose of 2.4 once weekly.  Follow-up in 3 months.    2. Bruises easily  Check blood work to further evaluate.  - CBC with Auto Differential; Future  - Iron and TIBC; Future  - Ferritin; Future  - Vitamin B12 & Folate; Future    3. Hypothyroidism, unspecified type  Currently stable we will continue with Synthroid and recheck blood work.  - Comprehensive Metabolic Panel; Future  - TSH with Reflex to FT4; Future    4. Hyperlipidemia, unspecified hyperlipidemia type  - Lipid Panel; Future    5. Fatigue, unspecified type  Checking blood work as listed below to further evaluate.  Possibly medication related.      HPI  Patient presents today for follow-up on weight loss.  Her BMI is down to 29.  Patient has been on the [facility] for the past year and has lost a total of 75 pounds.  She states she does have more nausea on the compounded medication but states overall she tolerates it well.  She has been noticing that she has bruising more easily and have an increase in fatigue so she would like blood work done today to check her CBC and blood counts.    Current Outpatient Medications   Medication Sig Dispense Refill    Semaglutide-Weight Management (WEGOVY) 2.4 MG/0.75ML SOAJ SC injection Inject 2.4 mg into the skin every 7 days 3 mL 1    levothyroxine (SYNTHROID) 50 MCG tablet TAKE 1 TABLET BY MOUTH ONCE DAILY ON AN EMPTY STOMACH 30 MINUTES BEFORE BREAKFAST 90 tablet 1    frovatriptan succinate (FROVA) 2.5 MG TABS Take 1 tablet by mouth daily as needed for  Migraine May repeat dose in 2 hours if needed. Do not exceed 2  doses in 24 hours 9 tablet 2    ondansetron (ZOFRAN) 4 MG tablet Take 1 tablet by mouth 3 times daily as needed for Nausea or Vomiting 30 tablet 3    triamcinolone (KENALOG) 0.1 % cream Apply to affected areas of the skin twice daily for up to 2 weeks or until improved. 80 g 2     No current facility-administered medications for this visit.       Patient's past medical history, surgical history, family history, medications,and allergies  were all reviewed and updated as appropriate today.      Review of Systems   Constitutional: Negative.    HENT: Negative.     Respiratory: Negative.     Cardiovascular: Negative.    Gastrointestinal:  Positive for nausea.   Musculoskeletal: Negative.    Skin: Negative.    Neurological: Negative.    Hematological:  Bruises/bleeds easily.   Psychiatric/Behavioral: Negative.           Physical Exam  Vitals reviewed.   Cardiovascular:      Rate and Rhythm: Normal rate and regular rhythm.      Heart sounds:  Normal heart sounds.   Pulmonary:      Effort: Pulmonary effort is normal.      Breath sounds: Normal breath sounds.   Skin:     General: Skin is warm and dry.   Neurological:      Mental Status: She is alert and oriented to person, place, and time.   Psychiatric:         Mood and Affect: Mood normal.         Behavior: Behavior normal.       Vitals:    06/29/22 0953   BP: 106/68   Pulse: 78   SpO2: 98%       This chart was generated using the Dragon dictation system.  I created this record but it may contain dictation errors due to the limitation of the software.

## 2022-06-30 LAB — COMPREHENSIVE METABOLIC PANEL
ALT: 6 U/L — ABNORMAL LOW (ref 10–40)
AST: 9 U/L — ABNORMAL LOW (ref 15–37)
Albumin/Globulin Ratio: 1.9 (ref 1.1–2.2)
Albumin: 4.4 g/dL (ref 3.4–5.0)
Alkaline Phosphatase: 74 U/L (ref 40–129)
Anion Gap: 10 (ref 3–16)
BUN: 9 mg/dL (ref 7–20)
CO2: 27 mmol/L (ref 21–32)
Calcium: 9.8 mg/dL (ref 8.3–10.6)
Chloride: 107 mmol/L (ref 99–110)
Creatinine: 0.5 mg/dL — ABNORMAL LOW (ref 0.6–1.1)
Est, Glom Filt Rate: >90 (ref >60)
Glucose: 86 mg/dL (ref 70–99)
Potassium: 4.5 mmol/L (ref 3.5–5.1)
Sodium: 144 mmol/L (ref 136–145)
Total Bilirubin: 0.3 mg/dL (ref 0.0–1.0)
Total Protein: 6.7 g/dL (ref 6.4–8.2)

## 2022-06-30 LAB — IRON AND TIBC
Iron % Saturation: 16 % (ref 15–50)
Iron: 45 ug/dL (ref 37–145)
TIBC: 282 ug/dL (ref 260–445)

## 2022-06-30 LAB — LIPID PANEL
Cholesterol, Total: 190 mg/dL (ref 0–199)
HDL: 50 mg/dL (ref 40–60)
LDL Cholesterol: 124 mg/dL — ABNORMAL HIGH (ref <100)
Triglycerides: 79 mg/dL (ref 0–150)
VLDL Cholesterol Calculated: 16 mg/dL

## 2022-08-10 MED ORDER — WEGOVY 2.4 MG/0.75ML SC SOAJ
2.40.75 MG/0.75ML | SUBCUTANEOUS | 1 refills | Status: AC
Start: 2022-08-10 — End: 2022-11-21

## 2022-08-10 NOTE — Telephone Encounter (Signed)
Last Office Visit  -  06/29/2022  Next Office Visit  -  n/a    Last Filled  -    Last UDS -    Contract -

## 2022-08-15 MED ORDER — LEVOTHYROXINE SODIUM 50 MCG PO TABS
50 MCG | ORAL_TABLET | ORAL | 1 refills | Status: AC
Start: 2022-08-15 — End: 2023-02-21

## 2022-08-15 NOTE — Telephone Encounter (Signed)
Last visit 06/29/22  No future

## 2022-09-12 ENCOUNTER — Inpatient Hospital Stay
Admit: 2022-09-12 | Discharge: 2022-09-12 | Disposition: A | Discharge status: Home / Self Care | Payer: PRIVATE HEALTH INSURANCE

## 2022-09-12 DIAGNOSIS — G43829 Menstrual migraine, not intractable, without status migrainosus: Secondary | ICD-10-CM

## 2022-09-12 MED ORDER — BUTALBITAL-APAP-CAFFEINE 50-325-40 MG PO TABS
50-325-40 | ORAL_TABLET | ORAL | 0 refills | Status: AC | PRN
Start: 2022-09-12 — End: 2022-09-22

## 2022-09-12 MED ORDER — KETOROLAC TROMETHAMINE 30 MG/ML IJ SOLN
30 | Freq: Once | INTRAMUSCULAR | Status: AC
Start: 2022-09-12 — End: 2022-09-12
  Administered 2022-09-12: 20:00:00 30 mg via INTRAVENOUS

## 2022-09-12 MED ORDER — DEXAMETHASONE SODIUM PHOSPHATE 10 MG/ML IJ SOLN
10 | Freq: Once | INTRAMUSCULAR | Status: AC
Start: 2022-09-12 — End: 2022-09-12
  Administered 2022-09-12: 20:00:00 10 mg via INTRAVENOUS

## 2022-09-12 MED ORDER — DIPHENHYDRAMINE HCL 50 MG/ML IJ SOLN
50 | Freq: Once | INTRAMUSCULAR | Status: AC
Start: 2022-09-12 — End: 2022-09-12
  Administered 2022-09-12: 20:00:00 25 mg via INTRAVENOUS

## 2022-09-12 MED ORDER — SODIUM CHLORIDE 0.9 % IV BOLUS
0.9 | Freq: Once | INTRAVENOUS | Status: AC
Start: 2022-09-12 — End: 2022-09-12
  Administered 2022-09-12: 20:00:00 1000 mL via INTRAVENOUS

## 2022-09-12 MED ORDER — PROCHLORPERAZINE EDISYLATE 10 MG/2ML IJ SOLN
10 | Freq: Four times a day (QID) | INTRAMUSCULAR | Status: DC | PRN
Start: 2022-09-12 — End: 2022-09-12
  Administered 2022-09-12: 20:00:00 10 mg via INTRAVENOUS

## 2022-09-12 MED ORDER — BUTALBITAL-APAP-CAFFEINE 50-325-40 MG PO TABS
50-325-40 | Freq: Once | ORAL | Status: AC
Start: 2022-09-12 — End: 2022-09-12
  Administered 2022-09-12: 20:00:00 1 via ORAL

## 2022-09-12 MED FILL — DIPHENHYDRAMINE HCL 50 MG/ML IJ SOLN: 50 MG/ML | INTRAMUSCULAR | Qty: 1

## 2022-09-12 MED FILL — DEXAMETHASONE SODIUM PHOSPHATE 10 MG/ML IJ SOLN: 10 MG/ML | INTRAMUSCULAR | Qty: 1

## 2022-09-12 MED FILL — KETOROLAC TROMETHAMINE 30 MG/ML IJ SOLN: 30 MG/ML | INTRAMUSCULAR | Qty: 1

## 2022-09-12 MED FILL — PROCHLORPERAZINE EDISYLATE 10 MG/2ML IJ SOLN: 10 MG/2ML | INTRAMUSCULAR | Qty: 2

## 2022-09-12 MED FILL — BUTALBITAL-APAP-CAFFEINE 50-325-40 MG PO TABS: 50-325-40 MG | ORAL | Qty: 1

## 2022-09-12 NOTE — ED Provider Notes (Signed)
[facility] Emergency Department    CHIEF COMPLAINT  Migraine (Started Saturday morning, )      SHARED SERVICE VISIT  Evaluated by APP.  My supervising physician was available for consultation.    HISTORY OF PRESENT ILLNESS  Jennifer Mitchell is a 37 y.o. female who presents to the ED complaining of migraine headache.  She does have a history of migraine headaches and does have abortive medication that she does use.  She says she normally gets a migraine headache during her menstrual cycle.  She has been experiencing this headache since Saturday.  She did take abortive medication yesterday morning with repeat dose 2 hours later which did seem to help her headache, however returned today.  Currently experiencing pain behind her right eye which radiates to the back of her head and down into her neck.  Currently rates her pain 6/10.  She is also experiencing associated photophobia with nausea.  Denies any vomiting.Denies any body ache, fevers or chills.  Denies any coughing or sneezing.  Denies any sore throat or congestion.  Denies any vision changes or dizziness.  Denies any chest pain, shortness of breath, or dyspnea on exertion.  Denies any nausea, vomiting, or abdominal pain.  Denies any urinary symptoms.  Denies any diarrhea or bloody stools.  Denies any new onset back pain.  Denies any recent travel or sick contacts.    No other complaints, modifying factors or associated symptoms.     Nursing notes reviewed.   Past Medical History:   Diagnosis Date    Hypothyroidism     IBS (irritable bowel syndrome)     Migraine      Past Surgical History:   Procedure Laterality Date    CYST REMOVAL       Family History   Problem Relation Age of Onset    Hypothyroidism Mother     Other Mother         colon polyps    High Cholesterol Mother     High Blood Pressure Father     Alzheimer's Disease Father     Prostate Cancer Father     High Cholesterol Brother     High Blood Pressure Brother     Ovarian  Cancer Maternal Grandmother     Heart Disease Paternal Grandfather      Social History     Socioeconomic History    Marital status: Married     Spouse name: Not on file    Number of children: Not on file    Years of education: Not on file    Highest education level: Not on file   Occupational History    Not on file   Tobacco Use    Smoking status: Never    Smokeless tobacco: Never   Vaping Use    Vaping status: Never Used   Substance and Sexual Activity    Alcohol use: Not Currently     Comment: socially    Drug use: Never    Sexual activity: Yes     Birth control/protection: Condom   Other Topics Concern    Not on file   Social History Narrative    Not on file     Social Determinants of Health     Financial Resource Strain: Low Risk  (06/29/2022)    Overall Financial Resource Strain (CARDIA)     Difficulty of Paying Living Expenses: Not hard at all   Food Insecurity: No Food Insecurity (06/29/2022)    Hunger  Vital Sign     Worried About Programme researcher, broadcasting/film/video in the Last Year: Never true     Ran Out of Food in the Last Year: Never true   Transportation Needs: Unknown (06/29/2022)    PRAPARE - Therapist, art (Medical): Not on file     Lack of Transportation (Non-Medical): No   Physical Activity: Not on file   Stress: Not on file   Social Connections: Not on file   Intimate Partner Violence: Not on file   Housing Stability: Unknown (06/29/2022)    Housing Stability Vital Sign     Unable to Pay for Housing in the Last Year: Not on file     Number of Places Lived in the Last Year: Not on file     Unstable Housing in the Last Year: No     Current Facility-Administered Medications   Medication Dose Route Frequency Provider Last Rate Last Admin    prochlorperazine (COMPAZINE) injection 10 mg  10 mg IntraVENous Q6H PRN Colan Neptune, PA-C   10 mg at 09/12/22 1604    sodium chloride 0.9 % bolus 1,000 mL  1,000 mL IntraVENous Once Colan Neptune, PA-C 1,000 mL/hr at 09/12/22 1616 1,000 mL at 09/12/22 1616      Current Outpatient Medications   Medication Sig Dispense Refill    butalbital-acetaminophen-caffeine (FIORICET, ESGIC) 50-325-40 MG per tablet Take 1 tablet by mouth every 4 hours as needed for Headaches Max Daily Amount: 6 tablets 60 tablet 0    levothyroxine (SYNTHROID) 50 MCG tablet TAKE 1 TABLET BY MOUTH ONCE DAILY ON  AN  EMPTY  STOMACH  30  MINUTES  BEFORE  BREAKFAST 90 tablet 1    Semaglutide-Weight Management (WEGOVY) 2.4 MG/0.75ML SOAJ SC injection Inject 2.4 mg into the skin every 7 days 3 mL 1    frovatriptan succinate (FROVA) 2.5 MG TABS Take 1 tablet by mouth daily as needed for Migraine May repeat dose in 2 hours if needed. Do not exceed 2  doses in 24 hours 9 tablet 2    ondansetron (ZOFRAN) 4 MG tablet Take 1 tablet by mouth 3 times daily as needed for Nausea or Vomiting 30 tablet 3    triamcinolone (KENALOG) 0.1 % cream Apply to affected areas of the skin twice daily for up to 2 weeks or until improved. 80 g 2     Allergies   Allergen Reactions    Latex Rash    Soy Allergy      Causes diarrhea    Tilactase     Azithromycin Hives    Clindamycin/Lincomycin Nausea And Vomiting       REVIEW OF SYSTEMS  10 systems reviewed, pertinent positives per HPI otherwise noted to be negative    PHYSICAL EXAM  BP 114/72   Pulse 76   Temp 98.5 F (36.9 C) (Oral)   Resp 18   Wt 74.8 kg (165 lb)   LMP 08/12/2022   SpO2 100%   BMI 29.23 kg/m   GENERAL APPEARANCE: Awake and alert. Cooperative.  In acute distress due to pain.  Nontoxic in appearance.  HEAD: Normocephalic. Atraumatic.  No battle signs or raccoon eyes.  EYES: PERRL. EOM's grossly intact.  No conjunctival injection or discharge.  No icterus.  No nystagmus.  ENT: Mucous membranes are pink and moist.   NECK: Supple.  Full range of motion with no neck pain or stiffness.  HEART: RRR. No murmurs, rubs or gallops.  Normal S1-S2.  No S3 or S4.  No tenderness palpation of chest wall.  LUNGS: Respirations unlabored. CTAB. Good air exchange. Speaking  comfortably in full sentences.   ABDOMEN: Soft. Non-distended. Non-tender. No guarding or rebound.   No masses. No organomegaly.   EXTREMITIES: No peripheral edema. Moves all extremities equally. All extremities neurovascularly intact.   SKIN: Warm and dry. No acute rashes.   NEUROLOGICAL: Alert and oriented. CN's 2-12 intact.  No focal neurodeficits.  No slurred speech.  No gross facial drooping. Strength 5/5, sensation intact.  Steady gait.  PSYCHIATRIC: Normal mood and affect.    RADIOLOGY  No results found.    ED COURSE  37 year old female presented emergency department from home for evaluation of a migraine headache ongoing since Saturday.  Does have a history of headaches and normally takes an abortive medication.  Did take this medication yesterday morning which did help with pain, however migraine returned.  She is experiencing photophobia with associated nausea.  Pain behind the right eye with radiation of the back of the head.  No focal deficits on exam.  No slurred speech.  Steady gait.  Patient received Decadron, Toradol, Benadryl, IV fluids, Fioricet and Compazine here in the emergency department for pain, with good relief.  Vital signs stable and reassuring.  With complete resolution of symptoms feel comfortable discharging patient at this time.  Risk management discussed and shared decision making had with patient and/or surrogate. All questions were answered. Patient will follow up with primary care provider and neurologist for further evaluation/treatment.  All questions answered.  Patient will return to ED for new/worsening symptoms.    Patient was sent home with a prescription for Fioricet.    CRITICAL CARE TIME  0 Minutes of critical care time spent not including separately billable procedures.    MDM  No results found for this visit on 09/12/22.    I estimate there is LOW risk for SUBARACHNOID HEMORRHAGE, MENINGITIS, INTRACRANIAL HEMORRHAGE, SUBDURAL HEMATOMA, OR STROKE, thus I consider the  discharge disposition reasonable. Kern Reap and I have discussed the diagnosis and risks, and we agree with discharging home to follow-up with their primary doctor. We also discussed returning to the Emergency Department immediately if new or worsening symptoms occur. We have discussed the symptoms which are most concerning (e.g., changing or worsening pain, weakness, vomiting, fever) that necessitate immediate return.    Final Impression    1. Menstrual migraine without status migrainosus, not intractable        Discharge Vital Signs:  Blood pressure 114/72, pulse 76, temperature 98.5 F (36.9 C), temperature source Oral, resp. rate 18, weight 74.8 kg (165 lb), last menstrual period 08/12/2022, SpO2 100%.     DISPOSITION  Patient was discharged to home in good condition.         Colan Neptune, PA-C  09/12/22 1710

## 2022-09-12 NOTE — ED Notes (Signed)
RN x2 to attempt PIV x4, no success.

## 2022-09-13 ENCOUNTER — Encounter

## 2022-09-22 NOTE — Telephone Encounter (Signed)
 Appts scheduled:

## 2022-09-22 NOTE — Telephone Encounter (Signed)
 Needs new pt appt first? Please advise.

## 2022-09-22 NOTE — Telephone Encounter (Signed)
 They can be scheduled in my 1020 and 1040 spots tomorrow morning

## 2022-10-10 NOTE — Telephone Encounter (Signed)
 Can put them in my 2 open spots and see them at the same time

## 2022-10-11 NOTE — Telephone Encounter (Signed)
 added

## 2022-10-12 ENCOUNTER — Ambulatory Visit: Admit: 2022-10-12 | Payer: PRIVATE HEALTH INSURANCE | Attending: Registered Nurse | Primary: Registered Nurse

## 2022-10-12 VITALS — BP 114/68 | HR 76 | Ht 63.0 in | Wt 172.2 lb

## 2022-10-12 DIAGNOSIS — M79621 Pain in right upper arm: Secondary | ICD-10-CM

## 2022-10-12 DIAGNOSIS — R300 Dysuria: Secondary | ICD-10-CM | POA: Diagnosis not present

## 2022-10-12 DIAGNOSIS — Z8669 Personal history of other diseases of the nervous system and sense organs: Secondary | ICD-10-CM | POA: Diagnosis not present

## 2022-10-12 LAB — POCT URINALYSIS DIPSTICK
Bilirubin, UA: NEGATIVE
Blood, UA POC: NEGATIVE
Glucose, UA POC: NEGATIVE mg/dL
Ketones, UA: NEGATIVE mg/dL
Nitrite, UA: NEGATIVE
Protein, UA POC: NEGATIVE mg/dL
Spec Grav, UA: 1.015
Urobilinogen, UA: 0.2 mg/dL
pH, UA: 7

## 2022-10-12 MED ORDER — FLUCONAZOLE 150 MG PO TABS
150 | ORAL_TABLET | Freq: Once | ORAL | 0 refills | Status: AC
Start: 2022-10-12 — End: 2022-10-12

## 2022-10-12 NOTE — Progress Notes (Signed)
 Patient: Jennifer Mitchell is a 37 y.o. female who presents today with the following Chief Complaint(s):  Chief Complaint   Patient presents with    Follow-Up from Hospital     Migraines- Vein in arm is hard and sore from IV       Assessment:  Encounter Diagnoses   Name Primary?    Pain in right upper arm Yes    Dysuria     History of migraine        Plan:  1. Pain in right upper arm  Will further evaluate with ultrasound of upper extremity.   - Vascular duplex upper extremity venous right; Future    2. Dysuria  Urine dip positive for trace leuks- will treat with a diflucan  based on symptoms and send a urine culture.   - POCT Urinalysis no Micro  - Culture, Urine    3. History of migraine  Continue current medications as needed and will follow up with neurology as scheduled on October 11th.       HPI  Patient presents today for an ER follow up. She was seen for migraines. She was given some IV medications and the migraines as of right now is better but she states her arm was swollen and hard after leaving the ER and it still feel hard in the area where the medication was given. She is concerned as it has been a week and will sore to touch. She denies any redness or swelling.   Regarding migraines she states normally her Frova  used to work but now it is not. She was given fioricet  at the ER but has not needed it yet. She has an appointment with neuro on October 11th to follow up on migraines.   Patient also has had some dysuria and urinary frequency. She states her vaginal area is irritated.       Current Outpatient Medications   Medication Sig Dispense Refill    fluconazole  (DIFLUCAN ) 150 MG tablet Take 1 tablet by mouth once for 1 dose 1 tablet 0    butalbital -acetaminophen -caffeine  (FIORICET , ESGIC ) 50-325-40 MG per tablet Take 1 tablet by mouth every 4 hours as needed for Headaches Max Daily Amount: 6 tablets 60 tablet 0    levothyroxine  (SYNTHROID ) 50 MCG tablet TAKE 1 TABLET BY MOUTH ONCE DAILY ON  AN  EMPTY   STOMACH  30  MINUTES  BEFORE  BREAKFAST 90 tablet 1    Semaglutide -Weight Management (WEGOVY ) 2.4 MG/0.75ML SOAJ SC injection Inject 2.4 mg into the skin every 7 days 3 mL 1    frovatriptan  succinate (FROVA ) 2.5 MG TABS Take 1 tablet by mouth daily as needed for Migraine May repeat dose in 2 hours if needed. Do not exceed 2  doses in 24 hours 9 tablet 2    ondansetron  (ZOFRAN ) 4 MG tablet Take 1 tablet by mouth 3 times daily as needed for Nausea or Vomiting 30 tablet 3    triamcinolone  (KENALOG ) 0.1 % cream Apply to affected areas of the skin twice daily for up to 2 weeks or until improved. 80 g 2     No current facility-administered medications for this visit.       Patient's past medical history, surgical history, family history, medications,and allergies  were all reviewed and updated as appropriate today.      Review of Systems   Constitutional: Negative.    HENT: Negative.     Respiratory: Negative.     Cardiovascular: Negative.    Gastrointestinal: Negative.  Genitourinary:  Positive for dysuria and frequency.   Musculoskeletal: Negative.    Skin: Negative.    Neurological:  Positive for headaches. Negative for dizziness.   Psychiatric/Behavioral: Negative.           Physical Exam  Vitals reviewed.   Cardiovascular:      Rate and Rhythm: Normal rate and regular rhythm.      Heart sounds: Normal heart sounds.   Pulmonary:      Effort: Pulmonary effort is normal.      Breath sounds: Normal breath sounds.   Musculoskeletal:      Comments: Right upper arm feels tender and firm to touch in area of IV    Skin:     General: Skin is warm and dry.   Neurological:      Mental Status: She is alert.       Vitals:    10/12/22 0750   BP: 114/68   Pulse: 76   SpO2: 98%       This chart was generated using the Dragon dictation system.  I created this record but it may contain dictation errors due to the limitation of the software.

## 2022-10-13 LAB — CULTURE, URINE: Urine Culture, Routine: <10000

## 2022-10-28 ENCOUNTER — Encounter: Payer: PRIVATE HEALTH INSURANCE | Attending: Neurology | Primary: Registered Nurse

## 2022-10-28 DIAGNOSIS — G43011 Migraine without aura, intractable, with status migrainosus: Secondary | ICD-10-CM | POA: Diagnosis not present

## 2022-10-28 MED ORDER — FROVATRIPTAN SUCCINATE 2.5 MG PO TABS
2.5 | ORAL_TABLET | ORAL | 11 refills | Status: DC
Start: 2022-10-28 — End: 2022-11-26

## 2022-10-28 MED ORDER — UBRELVY 100 MG PO TABS
100 | ORAL_TABLET | ORAL | 11 refills | 30.00 days | Status: DC | PRN
Start: 2022-10-28 — End: 2023-06-05

## 2022-10-28 NOTE — Telephone Encounter (Signed)
Submitted PA for [facility] CMM Key: [id number] STATUS:NOT SENT    Please complete Chart Note.    Once complete, respond to the pool ( P [facility] MEDICATION PRE-AUTH).      Thank you.

## 2022-10-28 NOTE — Progress Notes (Signed)
Jennifer Mitchell (DOB:  12/28/1985) is a 37 y.o. female,New patient, here for evaluation of the following chief complaint(s):  New Patient (Np referral for migraines)      Assessment & Plan   1. Intractable migraine without aura and with status migrainosus  Assessment & Plan:   Chronic, not at goal (unstable), changes made today: start Frova 2.5mg  BID x 6 days every 1 mo starting 3 days prior to onset of menstrual period (prophylaxis for catamenial migraines), start Ubrelvy 100mg  prn (abortive therapy for non-catamenial migraine attacks and breakthrough catamenial migraine despite Frova use), medication adherence emphasized, and lifestyle modifications recommended. 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 Frova previously has resulted in improved headache but only transiently. Catamenial migraines could be preventing with planned Frova use as described above since menstrual periods are predictable. She has failed three other triptans used as abortive therapy (Relpax, Zomig, and Imitrex). She does not require other prophylactic therapy if catamenial migraines can be prevented with Frova. She has failed 4 first line prophylactic medications.   Orders:  -     frovatriptan succinate (FROVA) 2.5 MG TABS; Take 1 tablet by mouth See Admin Instructions Start 3 days prior to expected onset of menstrual period associated headache. Take 1 tablet PO BID for 6 days every 1 month (every menstrual period)., Disp-12 tablet, R-11Normal  -     Ubrogepant (UBRELVY) 100 MG TABS; Take 100 mg by mouth as needed (migraine) If symptoms persist or return, may repeat dose after 2 hours. Maximum: 100mg  per dose; 200 mg per 24 hours., Disp-8 tablet, R-11Normal      Return in about 6 months (around 04/28/2023).       Subjective   New patient visit for headache.     Previous neurology consults:   10/22/2015 last office visit note from Drema [place], DO. 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."   10/2015 MRI Cspine wo: "No significant abnormality of the cervical spine."     Current headache treatment: OTC ibuprofen prn, OTC Excedrin prn, Frova prn    Headache treatment trials:   Nortriptyline ineffective  Trokendi tolerability issues itching  Propranolol ineffective  Topamax failed nos  Fioricet failed nos  Ibuprofen ineffective for menstrual migraine  Frovatriptan effective previously for catamenial migraine then stopped because headaches were well controlled, transient effective for 08/2022 headaches, previously only used prn not for prevention  Mobic failed nos  Diclofenac failed nos  sumatriptan PO ineffective  Zomig 5mg  IN ineffective  Treximet failed nos  She took progon b a week before her period when she had menstrual migraines (which was effective).  tizanidine does not recall  Relpax previously effective but tolerability issues palpitations     HPI:   Unaccompanied. She developed recurrent headaches at 37yo that started after starting oral hormonal contraceptive therapy. Tried different types of hormonal contraceptive pills but all caused headaches. She eventually stopped hormonal OCP but headaches improved but continued to be problematic catamenial pattern (anytime 3 days prior to or 3 days after menstrual period). Her menstrual periods are predictable every 26-28 days. Headaches are also triggered by aspartame. No migraines during pregnancy and first 6 months of breast feeding save for  those triggered by aspartame.     Prior to August 2024:  Migraine type headache. Duration 3 days. Frequency 1 episode x 3 days every 3 mo. Severe intensity. Pain sharp, shooting and sometimes throbbing. Pain location almost always right  side retro-orbital, right scalp, right neck rare left side same location.   Worse with exertion at the end but at the beginning tends to be improved with movement. +photo/osmophobia. +nausea. +stuff nose. Triggered by coughing, sneezing, laughing, lifting, straining.       She has other headaches that are bilateral neck and occipital region. Moderate intensity. Worse with exertion. Denies photo/osmophobia. No nausea. No other non pain symptoms. Frequency 2 days per month. Duration <1 hour resolves with ibuprofen.     In August 2024:   She had recurrent headaches that lasted multiple days frequency essentially every day for 30 days. Location was different left frontal, left scalp, and left neck. Denies red eye/ptosis/tearing. More intense than typical headache. Unresponsive to abortive therapies. The headache stopped after receiving migraine cocktail in ER. This headache would not resolve when laying down. This was associated with life stressors and lack of sleep. New foster son was adopted and he was not sleeping well. He is the 4th child. Also headaches tend to be worse in the heat. 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     Since August 2024: no migraine headaches since but similar frequency of milder headache type.         Review of Systems   All other systems reviewed and are negative.         Objective   Neurologic Exam     Mental Status   Oriented to person, place, and time.   Follows 1 step commands.   Attention: normal. Concentration: normal.   Speech: speech is normal   Level of consciousness: alert  Knowledge: consistent with education.   Normal comprehension.     Cranial Nerves     CN II   Visual fields full to confrontation.     CN III, IV, VI   Extraocular motions are normal.     CN V   Facial sensation intact.     CN VII   Facial expression full, symmetric.     CN VIII   Hearing: intact    CN IX, X   Palate: symmetric    CN XI   Right sternocleidomastoid strength: normal  Left  sternocleidomastoid strength: normal  Right trapezius strength: normal  Left trapezius strength: normal    CN XII   Tongue: not atrophic  Fasciculations: absent  Tongue deviation: none  Funduscopic exam did not reveal evidence of papilledema.       Motor Exam   Muscle bulk: normal  Overall muscle tone: normal  Right arm pronator drift: absent  Left arm pronator drift: absent    Strength   Strength 5/5 throughout.     Sensory Exam   Light touch normal.     Gait, Coordination, and Reflexes     Gait  Gait: normal    Coordination   Finger to nose coordination: normal    Tremor   Resting tremor: absent  Action tremor: absent    Reflexes   Right biceps: 2+  Left biceps: 2+  Right patellar: 2+  Left patellar: 2+  RAMs normal          On this date 10/28/2022 I have spent 45 minutes reviewing previous notes, test results and face  to face with the patient discussing the diagnosis and importance of compliance with the treatment plan as well as documenting on the day of the visit.

## 2022-10-28 NOTE — Assessment & Plan Note (Signed)
Chronic, not at goal (unstable), changes made today: start Frova 2.5mg  BID x 6 days every 1 mo starting 3 days prior to onset of menstrual period (prophylaxis for catamenial migraines), start Ubrelvy 100mg  prn (abortive therapy for non-catamenial migraine attacks and breakthrough catamenial migraine despite Frova use), medication adherence emphasized, and lifestyle modifications recommended. 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 Frova previously has resulted in improved headache but only transiently. Catamenial migraines could be preventing with planned Frova use as described above since menstrual periods are predictable. She has failed three other triptans used as abortive therapy (Relpax, Zomig, and Imitrex). She does not require other prophylactic therapy if catamenial migraines can be prevented with Frova. She has failed 4 first line prophylactic medications.

## 2022-10-28 NOTE — Patient Instructions (Addendum)
YOU MUST CONFIRM YOUR APPOINTMENT 1 DAY PRIOR OR IT WILL BE CANCELLED!!   Our office will call you 3 times the day prior to your appointment in an attempt to confirm.  Please return our call ASAP or confirm your appt through MyChart no later than 3 pm the day before your appointment.  If we do not hear back from you by 3 pm to confirm, your appointment will be cancelled & someone will be added into that slot from our wait list.       ===    Here are some behavioral and dietary changes you can make to improve your headaches:    Gradually reduce and ideally stop consuming caffeine. Caffeine use 3 or more days per week is likely to worsen your headache.    Exercise regularly. Try starting out with 30 minutes of aerobic exercise 3 times per week.    Make time to pursue activities that make you feel relaxed and happy. Try to spend at least 20 minutes outdoors (weather permitting). Consider pursuing artistic activities to explore your creative side. Try doing process-driven activities such as cooking and baking.    Make changes to improve your sleep at night (see below).    Take note of any food or drink items that seem to trigger your headaches within 12-24 hours after consuming the dietary item. Some common (but not universal) dietary triggers are listed below. If you identify a dietary trigger, then you may consider limiting the dietary item for 4 weeks then monitor your headache frequency, intensity, and response for rescue medications by using a headache diary. If there is no change to your headache, then the dietary item alone is unlikely to be a significant trigger for your headache.      Common (but not universal) dietary triggers for headache:    Alcohol, especially red wine   Aspartame sweetener   Beans and other tyramine-containing foods   Caffeine   Cheese   Yogurt   Food containing MSG    Processed meat containing sulfites   Vitamins and herbal supplements containing caffeine    Try keeping a headache diary  to track headache frequency, intensity, and response to rescue therapy. There are many smartphone apps that can be used for this purpose or you can use a handwritten journal. Keeping track of the mild headache days per month, severe headache days per month, and rescue medication use days per week will be helpful to monitor response to treatment.     Behavioral changes you can make to improve your sleep at night:   Be consistent; go to bed at the same time each night and get up at the same time each morning, including on the weekends    Make sure your bedroom is quiet, dark, relaxing, and at a comfortable temperature    Remove electronic devices, such as televisions, computers, and smartphones, from the bedroom    Avoid large meals, caffeine, and alcohol before bedtime   Get some exercise; being physically active during the day can help you fall asleep more easily at night     Online Resources:  <www.americanmigrainefoundation.org/> The patient guide section is a great resource to learn more about headache.

## 2022-10-31 NOTE — Telephone Encounter (Signed)
Renewed and Submitted PA for Ubrelvy Via CMM Key: [id number] STATUS: APPROVED 10/31/2022-05/01/2023.    Auth Expiration Date: 04/30/2023.    If this requires a response please respond to the pool ( P MHCX PSC MEDICATION PRE-AUTH).      Thank you please advise patient.

## 2022-11-01 NOTE — Telephone Encounter (Signed)
Patient is notified that Jennifer Mitchell was approved and she will reach out to pharmacy

## 2022-11-19 ENCOUNTER — Encounter

## 2022-11-21 MED ORDER — WEGOVY 2.4 MG/0.75ML SC SOAJ
2.4 | SUBCUTANEOUS | 1 refills | Status: AC
Start: 2022-11-21 — End: ?

## 2022-11-21 NOTE — Telephone Encounter (Signed)
Last Office Visit  -  10/12/22  Next Office Visit  -  n/a    Last Filled  -  08/10/22  Last UDS -    Contract -

## 2022-11-21 NOTE — Telephone Encounter (Signed)
[facility] is requesting a rx for     Semaglutide-Weight Management (WEGOVY) 2.4 MG/0.75ML SOAJ SC injection     Last OV 10/12/2022      Next OV Visit date not found

## 2022-11-25 NOTE — Telephone Encounter (Signed)
Submitted PA for Frovatriptan Via CMM Key: [id number] STATUS: "This drug is not covered under the pharmacy benefit. Prior Authorization is not available." (MedImpact)    If this requires a response please respond to the pool ( P MHCX PSC MEDICATION PRE-AUTH).      Thank you please advise patient.

## 2022-11-25 NOTE — Telephone Encounter (Signed)
Drug is not covered under the pharmacy benefit .    Please advise.

## 2022-11-26 ENCOUNTER — Encounter

## 2022-11-26 MED ORDER — NARATRIPTAN HCL 2.5 MG PO TABS
2.5 | ORAL_TABLET | ORAL | 11 refills | 15.00 days | Status: DC
Start: 2022-11-26 — End: 2023-06-05

## 2022-11-26 NOTE — Progress Notes (Signed)
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 Start alternative Amerge for catamenial migraine prophylaxis.

## 2022-12-01 NOTE — Telephone Encounter (Signed)
 Patient notified

## 2022-12-13 ENCOUNTER — Ambulatory Visit: Payer: 59 | Admitting: Sports Medicine

## 2022-12-14 ENCOUNTER — Encounter: Payer: Self-pay | Admitting: Sports Medicine

## 2022-12-14 ENCOUNTER — Ambulatory Visit: Payer: Commercial Managed Care - PPO | Admitting: Sports Medicine

## 2022-12-14 DIAGNOSIS — D234 Other benign neoplasm of skin of scalp and neck: Secondary | ICD-10-CM

## 2022-12-14 NOTE — Progress Notes (Signed)
    Procedures performed today:    Procedure:  Excision of two scalp sebaceous cysts, 3 cm each. Risks, benefits, and alternatives explained and consent obtained. Time out conducted. Surface prepped with chlorhexidine. A total of 2cc lidocaine with epinephine infiltrated in a field block around each one of the sebaceous cysts. Adequate anesthesia ensured. Area prepped and draped in a sterile fashion. Incision made with #15 blade. Using blunt dissection and sharp dissection, each of the sebaceous cysts were removed en bloc. Using of 3-0 Ethilon suture in a simple interrupted pattern I closed each of the incisions with 2 sutures each. Hemostasis achieved.  Independent interpretation of notes and tests performed by another provider:   None.  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 We will only bill for excision of one of the masses. Return to see me in a week for suture removal.    ____________________________________________ Ihor Austin. Benjamin Stain, M.D., ABFM., CAQSM., AME. Primary Care and Sports Medicine [facility]  Adjunct Professor of Family Medicine  [employer]  Restaurant manager, fast food

## 2022-12-14 NOTE — Assessment & Plan Note (Addendum)
Excision of 2 pilar cysts, 1 on the back of the head, 1 on the left temple. We will only bill for excision of one of the masses. Return to see me in a week for suture removal.

## 2023-02-21 ENCOUNTER — Encounter

## 2023-02-21 MED ORDER — LEVOTHYROXINE SODIUM 50 MCG PO TABS
50 | ORAL_TABLET | ORAL | 1 refills | Status: DC
Start: 2023-02-21 — End: 2023-08-21

## 2023-02-21 NOTE — Telephone Encounter (Signed)
Last Office Visit  -  10/12/22  Next Office Visit  -  n/a    Last Filled  -  08/15/22  Last UDS -    Contract -

## 2023-03-20 MED ORDER — WEGOVY 2.4 MG/0.75ML SC SOAJ
2.4 | SUBCUTANEOUS | 1 refills | 28.00000 days | Status: DC
Start: 2023-03-20 — End: 2023-07-14

## 2023-03-20 NOTE — Telephone Encounter (Signed)
 Last visit 10/12/22  No future

## 2023-04-19 NOTE — Telephone Encounter (Signed)
 Submitted RENEWAL PA for Jennifer Mitchell Via [facility] Key: [id number] STATUS: "Patient does not have active coverage with the payer.'" ([employer]-MedImpact).    Please update coverage in EPIC. Last card scanned in media was on 06/29/2022.    If this requires a response please respond to the pool ( P MHCX PSC MEDICATION PRE-AUTH).      Thank you please advise patient.

## 2023-04-20 NOTE — Telephone Encounter (Signed)
 I called the patient . She did have a change in insurance. She is now currently using [employer].     She states she will upload a picture of the front and back of her card vis my chart.     While talking to patient she states that she is using the copay card.

## 2023-04-24 NOTE — Telephone Encounter (Signed)
 Patient had insurance change and insurance card uploaded into media.     Office has not been able to put new insurance due to not be able to find it in the system. Office reached out to management on how to proceed.

## 2023-04-25 NOTE — Telephone Encounter (Signed)
 Submitted PA for Ubrelvy 100MG  tablets  Via CMM [id number]  STATUS: PENDING.    Follow up done daily; if no decision with in three days we will refax.  If another three days goes by with no decision will call the insurance for status.

## 2023-04-25 NOTE — Telephone Encounter (Signed)
 Patient insurance has changed.      It is updated in patient chart and a copy of the card has been uploaded to media.     Please review for the PA for Ubrelvy.

## 2023-04-26 NOTE — Telephone Encounter (Signed)
 The medication was DENIED; DENIAL letter is uploaded to MEDIA.    Generic Denial:  Other; please see Denial Letter.     Note :    This is saying the insurance does not cover branded drugs.    If you want an APPEAL; please note in this encounter what new information you would like to APPEAL with.  Once complete route back to PA POOL.    If this requires a response please respond to the pool ( P MHCX PSC MEDICATION PRE-AUTH).      Thank you please advise patient.

## 2023-05-10 ENCOUNTER — Encounter: Payer: PRIVATE HEALTH INSURANCE | Attending: Neurology | Primary: Registered Nurse

## 2023-06-05 ENCOUNTER — Ambulatory Visit
Admit: 2023-06-05 | Discharge: 2023-06-05 | Payer: PRIVATE HEALTH INSURANCE | Attending: Neurology | Primary: Registered Nurse

## 2023-06-05 VITALS — BP 107/62 | HR 74 | Ht 63.0 in | Wt 191.5 lb

## 2023-06-05 DIAGNOSIS — G43011 Migraine without aura, intractable, with status migrainosus: Secondary | ICD-10-CM

## 2023-06-05 MED ORDER — UBRELVY 100 MG PO TABS
100 | ORAL_TABLET | ORAL | 11 refills | 30.00000 days | Status: DC | PRN
Start: 2023-06-05 — End: 2023-12-07

## 2023-06-05 NOTE — Progress Notes (Signed)
 History of Present Illness  Follow-up visit.    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 Her condition has shown significant improvement since then. Migraines typically last for a 72-hour period, often not realizing she is in the midst of an episode until 12 to 24 hours have passed. These migraines are exclusively associated with her menstrual cycle. The rest of the days, she experiences mild headaches, which are frequent but not severe enough to warrant medication. She reports a high tolerance for pain and expresses satisfaction with her current treatment plan. She was prescribed Frova  (frovatriptan ), but due to insurance issues, she was unable to utilize this medication. Consequently, she was transitioned to Amerge (naratriptan ), which unfortunately did not provide the desired relief. She continues to rely on Ubrelvy, which she finds beneficial in managing her symptoms. However, it does not completely alleviate the migraine, but rather reduces its duration to approximately 24 hours, allowing her to manage until the next dose of Ubrelvy. She typically consumes between 3 to 6 pills per month, depending on the severity of her migraines.    INTERVAL: Since last visit, she reports significant improvement in her migraines. She was unable to use Frova  due to insurance issues and was transitioned to Amerge, which did not provide the desired relief. She continues to rely on Ubrelvy for managing her symptoms.    SOCIAL HISTORY  Occupations: Works for [employer] in [place] .  Screen Time: Works at [employer].    BP 107/62 (BP Site: Right Upper Arm)   Pulse 74   Ht 1.6 m (5\' 3" )   Wt 86.9 kg (191 lb 8 oz)   SpO2 98%   BMI 33.92 kg/m     Physical Exam      Neurological Exam  Mental Status  Awake, alert and oriented to person, place and time. Speech is normal.    Cranial Nerves  CN VII:  Right: There is no facial  weakness.  Left: There is no facial weakness.       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     Assessment & Plan  1.  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  Jennifer Mitchell has been the most effective treatment for her migraine type headache and she is happy with response.  Frovatriptan  scheduled was effective for catamenial migraine in the past but was not approved by insurance.  This is overlying a frequent mild background tension type headache that does not require intervention because it is still mild.  Failed headache medication trials = naratriptan  scheduled, nortriptyline, Trokendi (tolerability issues itching), propranolol, Topamax, Fioricet , ibuprofen, Mobic, diclofenac, sumatriptan p.o., Zomig intranasal, Relpax (tolerability issues palpitations).    Continue Ubrelvy 100 mg as needed  No intervention needed for tension type headache    Follow-up  The patient will follow up in 1 year.    The patient (or guardian, if applicable) and other individuals in attendance with the patient were advised that Artificial Intelligence will be utilized during this visit to record, process the conversation to generate a clinical note, and support improvement of the AI technology. The patient (or guardian, if applicable) and other individuals in attendance at the appointment consented to  the use of AI, including the recording.     On this date 06/05/2023 I have spent 10 minutes reviewing previous notes, test results and face to face with the patient discussing the diagnosis and importance of compliance with the treatment plan as well as documenting on the day of the visit.

## 2023-06-05 NOTE — Patient Instructions (Signed)
 Here are some behavioral and dietary changes you can make to improve your headaches:    Gradually reduce and ideally stop consuming caffeine. Caffeine use 3 or more days per week is likely to worsen your headache.    Exercise regularly. Try starting out with 30 minutes of aerobic exercise 3 times per week.    Make time to pursue activities that make you feel relaxed and happy. Try to spend at least 20 minutes outdoors (weather permitting). Consider pursuing artistic activities to explore your creative side. Try doing process-driven activities such as cooking and baking.    Make changes to improve your sleep at night (see below).    Take note of any food or drink items that seem to trigger your headaches within 12-24 hours after consuming the dietary item. Some common (but not universal) dietary triggers are listed below. If you identify a dietary trigger, then you may consider limiting the dietary item for 4 weeks then monitor your headache frequency, intensity, and response for rescue medications by using a headache diary. If there is no change to your headache, then the dietary item alone is unlikely to be a significant trigger for your headache.      Common (but not universal) dietary triggers for headache:    Alcohol, especially red wine   Aspartame sweetener   Beans and other tyramine-containing foods   Caffeine   Cheese   Yogurt   Food containing MSG    Processed meat containing sulfites   Vitamins and herbal supplements containing caffeine    Try keeping a headache diary to track headache frequency, intensity, and response to rescue therapy. There are many smartphone apps that can be used for this purpose or you can use a handwritten journal. Keeping track of the mild headache days per month, severe headache days per month, and rescue medication use days per week will be helpful to monitor response to treatment.     Behavioral changes you can make to improve your sleep at night:   Be consistent; go to bed at  the same time each night and get up at the same time each morning, including on the weekends    Make sure your bedroom is quiet, dark, relaxing, and at a comfortable temperature    Remove electronic devices, such as televisions, computers, and smartphones, from the bedroom    Avoid large meals, caffeine, and alcohol before bedtime   Get some exercise; being physically active during the day can help you fall asleep more easily at night     Online Resources:  <www.americanmigrainefoundation.org/> The patient guide section is a great resource to learn more about headache.

## 2023-07-10 NOTE — Telephone Encounter (Signed)
 Called [place] and have her at the 1:00 o'clock spot

## 2023-07-14 ENCOUNTER — Ambulatory Visit
Admit: 2023-07-14 | Discharge: 2023-07-14 | Payer: PRIVATE HEALTH INSURANCE | Attending: Registered Nurse | Primary: Registered Nurse

## 2023-07-14 VITALS — BP 134/70 | HR 68 | Ht 63.0 in | Wt 200.8 lb

## 2023-07-14 DIAGNOSIS — Z0000 Encounter for general adult medical examination without abnormal findings: Principal | ICD-10-CM

## 2023-07-14 MED ORDER — ONDANSETRON HCL 4 MG PO TABS
4 | ORAL_TABLET | Freq: Three times a day (TID) | ORAL | 3 refills | 7.00000 days | Status: AC | PRN
Start: 2023-07-14 — End: ?

## 2023-07-14 MED ORDER — SEMAGLUTIDE-WEIGHT MANAGEMENT 1 MG/0.5ML SC SOAJ
1 | SUBCUTANEOUS | 2 refills | 28.00000 days | Status: AC
Start: 2023-07-14 — End: ?

## 2023-07-14 NOTE — Patient Instructions (Signed)
 Well Visit, Ages 88 to 65: Care Instructions  Well visits can help you stay healthy. Your doctor has checked your overall health and may have suggested ways to take good care of yourself. Your doctor also may have recommended tests. You can help prevent illness with healthy eating, good sleep, vaccinations, regular exercise, and other steps.    Get the tests that you and your doctor decide on. Depending on your age and risks, examples might include screening for diabetes; hepatitis C; HIV; and cervical, breast, lung, and colon cancer. Screening helps find diseases before any symptoms appear.   Eat healthy foods. Choose fruits, vegetables, whole grains, lean protein, and low-fat dairy foods. Limit saturated fat and reduce salt.     Limit alcohol. Men should have no more than 2 drinks a day. Women should have no more than 1. For some people, no alcohol is the best choice.   Exercise. Get at least 30 minutes of exercise on most days of the week. Walking can be a good choice.     Reach and stay at your healthy weight. This will lower your risk for many health problems.   Take care of your mental health. Try to stay connected with friends, family, and community, and find ways to manage stress.     If you're feeling depressed or hopeless, talk to someone. A counselor can help. If you don't have a counselor, talk to your doctor.   Talk to your doctor if you think you may have a problem with alcohol or drug use. This includes prescription medicines, marijuana, and other drugs.     Avoid tobacco and nicotine: Don't smoke, vape, or chew. If you need help quitting, talk to your doctor.   Practice safer sex. Getting tested, using condoms or dental dams, and limiting sex partners can help prevent STIs.     Use birth control if it's important to you to prevent pregnancy. Talk with your doctor about your choices and what might be best for you.   Prevent problems where you can. Protect your skin from too much sun, wash your  hands, brush your teeth twice a day, and wear a seat belt in the car.   Where can you learn more?  Go to [employer] and enter P072 to learn more about "Well Visit, Ages 50 to 11: Care Instructions."  Current as of: May 17, 2022  Content Version: 14.5   2024-2025 [place], [place].   Care instructions adapted under license by [facility]. If you have questions about a medical condition or this instruction, always ask your healthcare professional. Laren Player, [facility], disclaims any warranty or liability for your use of this information.

## 2023-07-14 NOTE — Assessment & Plan Note (Addendum)
 Chronic, at goal (stable), continue current treatment plan    Orders:    TSH reflex to FT4,FT3; Future

## 2023-07-14 NOTE — Assessment & Plan Note (Addendum)
 Will restart wegovy . Zofran  sent in to help with nausea. Follow up in 3 months.     Orders:    Semaglutide -Weight Management (WEGOVY ) 1 MG/0.5ML SOAJ SC injection; Inject 1.5 mg into the skin every 7 days    ondansetron  (ZOFRAN ) 4 MG tablet; Take 1 tablet by mouth 3 times daily as needed for Nausea or Vomiting

## 2023-07-14 NOTE — Progress Notes (Signed)
 Well Adult Note  Name: Jennifer Mitchell Date: 07/14/2023   MRN: [id number] Sex: Female   Age: 38 y.o. Ethnicity: Non-Hispanic / Non Latino   DOB: May 01, 1985 Race: White (non-Hispanic)      Tambi Thole is here for a well adult exam.       Assessment & Plan   Assessment & Plan  Encounter for well adult exam without abnormal findings   Patient's past medical history, surgical history, family history, medications,  and allergies  were all reviewed and updated as appropriate today.     Orders:    Comprehensive Metabolic Panel; Future    Lipid, Fasting; Future    Class 2 obesity due to excess calories without serious comorbidity with body mass index (BMI) of 35.0 to 35.9 in adult   Will restart wegovy . Zofran  sent in to help with nausea. Follow up in 3 months.     Orders:    Semaglutide -Weight Management (WEGOVY ) 1 MG/0.5ML SOAJ SC injection; Inject 1.5 mg into the skin every 7 days    ondansetron  (ZOFRAN ) 4 MG tablet; Take 1 tablet by mouth 3 times daily as needed for Nausea or Vomiting    Hypothyroidism, unspecified type   Chronic, at goal (stable), continue current treatment plan    Orders:    TSH reflex to FT4, FT3; Future               Subjective   History:  Patient presents today for a wellness exam. She had stopped the wegovy  due to supply and cost. She is interested in restarting it at the compound pharmacy.  She has a history of hypothyroid and has been stable on synthroid . Will check blood work today. Follow up on 3 months.     Review of Systems   Constitutional: Negative.    HENT: Negative.     Respiratory: Negative.     Cardiovascular: Negative.    Gastrointestinal: Negative.    Musculoskeletal: Negative.    Skin: Negative.    Neurological: Negative.    Psychiatric/Behavioral: Negative.         Allergies   Allergen Reactions    Latex Rash    Soy Allergy (Obsolete)      Causes diarrhea    Tilactase     Azithromycin Hives    Clindamycin/Lincomycin Nausea And Vomiting     Prior to Visit Medications     Medication Sig Taking? Authorizing Provider   Ubrogepant  (UBRELVY ) 100 MG TABS Take 100 mg by mouth as needed (migraine) If symptoms persist or return, may repeat dose after 2 hours. Maximum: 100mg  per dose; 200 mg per 24 hours. Yes Madonna Cordella BIRCH, MD   Semaglutide -Weight Management (WEGOVY ) 2.4 MG/0.75ML SOAJ SC injection Inject 2.4 mg into the skin every 7 days Yes Gizzelle Lacomb, APRN - CNP   levothyroxine  (SYNTHROID ) 50 MCG tablet TAKE 1 TABLET BY MOUTH ONCE DAILY ON AN EMPTY STOMACH 30 MINUTES BEFORE BREAKFAST Yes Luisalberto Beegle, APRN - CNP   ondansetron  (ZOFRAN ) 4 MG tablet Take 1 tablet by mouth 3 times daily as needed for Nausea or Vomiting Yes Damani Rando, APRN - CNP   triamcinolone  (KENALOG ) 0.1 % cream Apply to affected areas of the skin twice daily for up to 2 weeks or until improved. Yes Morris, Charolet HERO, MD     Past Medical History:   Diagnosis Date    Hypothyroidism     IBS (irritable bowel syndrome)     Migraine      Past Surgical History:  Procedure Laterality Date    CYST REMOVAL       Family History   Problem Relation Age of Onset    Hypothyroidism Mother     Other Mother         colon polyps    High Cholesterol Mother     High Blood Pressure Father     Alzheimer's Disease Father     Prostate Cancer Father     High Cholesterol Father     High Cholesterol Brother     High Blood Pressure Brother     Ovarian Cancer Maternal Grandmother     Heart Disease Paternal Grandfather     Migraines Neg Hx      Social History     Tobacco Use    Smoking status: Never    Smokeless tobacco: Never   Vaping Use    Vaping status: Never Used   Substance Use Topics    Alcohol use: Not Currently     Comment: socially    Drug use: Never           Objective    Vital Signs  BP 134/70   Pulse 68   Ht 1.6 m (5' 3)   Wt 91.1 kg (200 lb 12.8 oz)   SpO2 97%   BMI 35.57 kg/m     Wt Readings from Last 3 Encounters:   07/14/23 91.1 kg (200 lb 12.8 oz)   06/05/23 86.9 kg (191 lb 8 oz)   10/28/22 78 kg (172  lb)       Physical Exam  Vitals reviewed.   HENT:      Right Ear: Tympanic membrane, ear canal and external ear normal.      Left Ear: Tympanic membrane, ear canal and external ear normal.      Nose: No congestion.   Cardiovascular:      Rate and Rhythm: Normal rate and regular rhythm.      Heart sounds: Normal heart sounds.   Pulmonary:      Effort: Pulmonary effort is normal.      Breath sounds: Normal breath sounds.   Abdominal:      General: Bowel sounds are normal.   Skin:     General: Skin is warm and dry.   Neurological:      Mental Status: She is alert and oriented to person, place, and time.   Psychiatric:         Mood and Affect: Mood normal.         Behavior: Behavior normal.             Personalized Preventive Plan  Current Health Maintenance Status  Immunization History   Administered Date(s) Administered    COVID-19, PFIZER PURPLE top, DILUTE for use, (age 51 y+), 26mcg/0.3mL 09/17/2019, 11/12/2019    DTP 02/01/1985, 03/02/1985, 05/08/1985, 08/11/1986, 01/01/1990    Hep B, RECOMBIVAX-HB, (age 294y-15y), IM, 1mL 04/23/1996, 08/19/1997, 03/19/1999    Hib PRP-T, ACTHIB (age 8m-5y, Adlt Risk), HIBERIX (age 29w-4y, Adlt Risk), IM, 0.22mL 02/07/1987    Influenza Virus Vaccine 10/17/2013, 09/29/2015, 11/07/2017    Influenza Whole 12/30/2006, 10/31/2008    Influenza, FLUBLOK, (age 39 y+), Quadv PF, 0.30mL 11/14/2018, 12/13/2020    Influenza, FLUCELVAX, (age 29 mo+), MDCK, Quadv PF, 0.59mL 10/13/2016, 11/28/2019, 11/14/2021    MMR, PRIORIX, M-M-R II, (age 291m+), SC, 0.10mL 01/01/1990    Measles 03/14/1987    Meningococcal MPSV4 (Menomune) 07/12/2003    Mumps 11/15/1985    PPD Test 06/22/2004  Polio OPV 02/01/1985, 05/08/1985, 08/11/1986, 01/01/1990    Rubella 03/13/1986    TD 5LF, TENIVAC, (age 7y+), IM, 0.57mL 08/19/1997    TDaP, ADACEL (age 18y-64y), MYRTICE (age 10y+), IM, 0.57mL 08/20/2007, 09/19/2012, 04/04/2013, 10/13/2016        Health Maintenance Due   Topic Date Due    Varicella vaccine (1 of 2 - 13+ 2-dose  series) Never done    HIV screen  Never done    Hepatitis C screen  Never done    Pneumococcal 0-49 years Vaccine (1 of 2 - PCV) Never done    Cervical cancer screen  Never done    COVID-19 Vaccine (3 - 2024-25 season) 09/18/2022    Depression Screen  06/29/2023      Recommendations for Preventive Services Due: see orders and patient instructions/AVS.

## 2023-08-21 MED ORDER — LEVOTHYROXINE SODIUM 50 MCG PO TABS
50 | ORAL_TABLET | ORAL | 2 refills | 90.00000 days | Status: AC
Start: 2023-08-21 — End: ?

## 2023-08-21 NOTE — Telephone Encounter (Signed)
 Last visit 07/14/23  No future

## 2023-09-19 ENCOUNTER — Encounter: Payer: Self-pay | Admitting: Sports Medicine

## 2023-12-07 ENCOUNTER — Encounter

## 2023-12-07 NOTE — Telephone Encounter (Signed)
"  Spoke with Pharmacist.  The RX sent in on 06/05/23 expired. They need a new script.     Please sign pended RX   "

## 2023-12-09 MED ORDER — UBRELVY 100 MG PO TABS
100 | ORAL_TABLET | ORAL | 11 refills | 30.00000 days | Status: AC | PRN
Start: 2023-12-09 — End: ?

## 2024-01-15 NOTE — Telephone Encounter (Signed)
"  Pt called stating she has been having more migraines, waking up in the night feeling a migraine coming on. Pt also has had swelling in her right hand, may be due to work (working in a [employer]. ) pt is requesting an appt. Pt stated she would be good seeing NP Bretta Flavors if applicable.     Please advise.   "

## 2024-01-15 NOTE — Telephone Encounter (Signed)
"  LOV 06/05/23  NOV 06/03/24    Patient reports she has been having more migraines, waking up in the night feeling a migraine coming on. She also has had swelling in her right hand, may be due to work (working in a [employer]. )     Please confirm if ok to schedule with [facility] for a sooner appt.  "

## 2024-01-16 NOTE — Telephone Encounter (Signed)
"  Left message on vm to call office to schedule next available with Quinton or Dr Madonna.   "

## 2024-01-17 NOTE — Telephone Encounter (Signed)
"  Patient is schedule for 01/24/23  "

## 2024-01-24 ENCOUNTER — Encounter: Payer: PRIVATE HEALTH INSURANCE | Primary: Registered Nurse
